# Patient Record
Sex: Female | Born: 1950
Health system: Southern US, Community
[De-identification: ages and names within clinical notes are randomized; demographics above are authoritative.]

## PROBLEM LIST (undated history)

## (undated) DIAGNOSIS — Z87442 Personal history of urinary calculi: Secondary | ICD-10-CM

## (undated) DIAGNOSIS — I480 Paroxysmal atrial fibrillation: Secondary | ICD-10-CM

## (undated) DIAGNOSIS — K5730 Diverticulosis of large intestine without perforation or abscess without bleeding: Secondary | ICD-10-CM

## (undated) DIAGNOSIS — Z950 Presence of cardiac pacemaker: Secondary | ICD-10-CM

## (undated) DIAGNOSIS — M1990 Unspecified osteoarthritis, unspecified site: Secondary | ICD-10-CM

## (undated) DIAGNOSIS — I495 Sick sinus syndrome: Secondary | ICD-10-CM

## (undated) DIAGNOSIS — I499 Cardiac arrhythmia, unspecified: Secondary | ICD-10-CM

## (undated) DIAGNOSIS — F419 Anxiety disorder, unspecified: Secondary | ICD-10-CM

## (undated) HISTORY — PX: CHOLECYSTECTOMY: SHX55

## (undated) HISTORY — DX: Paroxysmal atrial fibrillation: I48.0

## (undated) HISTORY — DX: Anxiety disorder, unspecified: F41.9

## (undated) HISTORY — PX: APPENDECTOMY: SHX54

## (undated) HISTORY — PX: CERVICAL SPINE SURGERY: SHX589

## (undated) HISTORY — DX: Sick sinus syndrome: I49.5

## (undated) HISTORY — PX: INSERT / REPLACE / REMOVE PACEMAKER: SUR710

## (undated) HISTORY — PX: OTHER SURGICAL HISTORY: SHX169

## (undated) HISTORY — DX: Diverticulosis of large intestine without perforation or abscess without bleeding: K57.30

## (undated) HISTORY — PX: ABDOMINAL HYSTERECTOMY: SHX81

## (undated) HISTORY — PX: BREAST BIOPSY: SHX20

## (undated) HISTORY — DX: Unspecified osteoarthritis, unspecified site: M19.90

---

## 2000-12-21 ENCOUNTER — Emergency Department (HOSPITAL_COMMUNITY): Admission: EM | Admit: 2000-12-21 | Discharge: 2000-12-21 | Payer: Self-pay | Admitting: Emergency Medicine

## 2000-12-21 ENCOUNTER — Encounter: Payer: Self-pay | Admitting: Emergency Medicine

## 2001-06-28 ENCOUNTER — Encounter: Payer: Self-pay | Admitting: *Deleted

## 2001-06-28 ENCOUNTER — Emergency Department (HOSPITAL_COMMUNITY): Admission: EM | Admit: 2001-06-28 | Discharge: 2001-06-28 | Payer: Self-pay | Admitting: Emergency Medicine

## 2001-07-30 ENCOUNTER — Inpatient Hospital Stay (HOSPITAL_COMMUNITY): Admission: RE | Admit: 2001-07-30 | Discharge: 2001-07-31 | Payer: Self-pay | Admitting: Neurosurgery

## 2001-07-30 ENCOUNTER — Encounter: Payer: Self-pay | Admitting: Neurosurgery

## 2001-08-13 ENCOUNTER — Encounter: Payer: Self-pay | Admitting: Neurosurgery

## 2001-08-13 ENCOUNTER — Encounter: Admission: RE | Admit: 2001-08-13 | Discharge: 2001-08-13 | Payer: Self-pay | Admitting: Neurosurgery

## 2002-07-15 ENCOUNTER — Emergency Department (HOSPITAL_COMMUNITY): Admission: EM | Admit: 2002-07-15 | Discharge: 2002-07-15 | Payer: Self-pay | Admitting: Emergency Medicine

## 2002-07-15 ENCOUNTER — Encounter: Payer: Self-pay | Admitting: Emergency Medicine

## 2002-08-02 ENCOUNTER — Encounter: Admission: RE | Admit: 2002-08-02 | Discharge: 2002-08-02 | Payer: Self-pay | Admitting: Neurosurgery

## 2002-08-04 ENCOUNTER — Encounter: Admission: RE | Admit: 2002-08-04 | Discharge: 2002-08-04 | Payer: Self-pay | Admitting: Neurosurgery

## 2002-08-04 ENCOUNTER — Encounter: Payer: Self-pay | Admitting: Neurosurgery

## 2002-09-06 ENCOUNTER — Encounter: Payer: Self-pay | Admitting: Neurosurgery

## 2002-09-08 ENCOUNTER — Inpatient Hospital Stay (HOSPITAL_COMMUNITY): Admission: RE | Admit: 2002-09-08 | Discharge: 2002-09-09 | Payer: Self-pay | Admitting: Neurosurgery

## 2002-09-08 ENCOUNTER — Encounter: Payer: Self-pay | Admitting: Neurosurgery

## 2003-02-22 ENCOUNTER — Encounter: Payer: Self-pay | Admitting: Neurosurgery

## 2003-02-22 ENCOUNTER — Encounter: Admission: RE | Admit: 2003-02-22 | Discharge: 2003-02-22 | Payer: Self-pay | Admitting: Neurosurgery

## 2004-08-08 ENCOUNTER — Ambulatory Visit (HOSPITAL_COMMUNITY): Admission: RE | Admit: 2004-08-08 | Discharge: 2004-08-08 | Payer: Self-pay | Admitting: Family Medicine

## 2004-09-04 ENCOUNTER — Other Ambulatory Visit: Admission: RE | Admit: 2004-09-04 | Discharge: 2004-09-04 | Payer: Self-pay | Admitting: Family Medicine

## 2004-09-04 ENCOUNTER — Ambulatory Visit (HOSPITAL_COMMUNITY): Admission: RE | Admit: 2004-09-04 | Discharge: 2004-09-04 | Payer: Self-pay | Admitting: Family Medicine

## 2005-02-14 ENCOUNTER — Ambulatory Visit (HOSPITAL_COMMUNITY): Admission: RE | Admit: 2005-02-14 | Discharge: 2005-02-14 | Payer: Self-pay | Admitting: Family Medicine

## 2005-02-19 ENCOUNTER — Ambulatory Visit (HOSPITAL_COMMUNITY): Admission: RE | Admit: 2005-02-19 | Discharge: 2005-02-19 | Payer: Self-pay | Admitting: Family Medicine

## 2005-07-23 ENCOUNTER — Ambulatory Visit (HOSPITAL_COMMUNITY): Admission: RE | Admit: 2005-07-23 | Discharge: 2005-07-23 | Payer: Self-pay | Admitting: Family Medicine

## 2005-12-12 ENCOUNTER — Ambulatory Visit (HOSPITAL_COMMUNITY): Admission: RE | Admit: 2005-12-12 | Discharge: 2005-12-12 | Payer: Self-pay | Admitting: Family Medicine

## 2005-12-15 HISTORY — PX: COLONOSCOPY: SHX174

## 2006-01-28 ENCOUNTER — Ambulatory Visit (HOSPITAL_COMMUNITY): Admission: RE | Admit: 2006-01-28 | Discharge: 2006-01-28 | Payer: Self-pay | Admitting: Family Medicine

## 2006-10-25 ENCOUNTER — Emergency Department (HOSPITAL_COMMUNITY): Admission: EM | Admit: 2006-10-25 | Discharge: 2006-10-25 | Payer: Self-pay | Admitting: Emergency Medicine

## 2006-10-27 ENCOUNTER — Ambulatory Visit: Payer: Self-pay | Admitting: Internal Medicine

## 2006-10-28 ENCOUNTER — Ambulatory Visit (HOSPITAL_COMMUNITY): Admission: RE | Admit: 2006-10-28 | Discharge: 2006-10-28 | Payer: Self-pay | Admitting: Internal Medicine

## 2006-10-28 ENCOUNTER — Encounter (INDEPENDENT_AMBULATORY_CARE_PROVIDER_SITE_OTHER): Payer: Self-pay | Admitting: Specialist

## 2006-10-28 ENCOUNTER — Ambulatory Visit: Payer: Self-pay | Admitting: Internal Medicine

## 2006-11-10 ENCOUNTER — Ambulatory Visit: Payer: Self-pay | Admitting: Internal Medicine

## 2006-11-18 ENCOUNTER — Ambulatory Visit (HOSPITAL_COMMUNITY): Admission: RE | Admit: 2006-11-18 | Discharge: 2006-11-18 | Payer: Self-pay | Admitting: Internal Medicine

## 2006-11-26 ENCOUNTER — Ambulatory Visit: Payer: Self-pay | Admitting: Internal Medicine

## 2006-12-17 ENCOUNTER — Ambulatory Visit: Payer: Self-pay | Admitting: Internal Medicine

## 2007-10-20 ENCOUNTER — Ambulatory Visit (HOSPITAL_COMMUNITY): Admission: RE | Admit: 2007-10-20 | Discharge: 2007-10-20 | Payer: Self-pay | Admitting: Family Medicine

## 2007-12-16 HISTORY — PX: OTHER SURGICAL HISTORY: SHX169

## 2008-02-18 ENCOUNTER — Ambulatory Visit (HOSPITAL_COMMUNITY): Admission: RE | Admit: 2008-02-18 | Discharge: 2008-02-18 | Payer: Self-pay | Admitting: Family Medicine

## 2008-04-18 ENCOUNTER — Ambulatory Visit: Payer: Self-pay | Admitting: Internal Medicine

## 2008-06-08 ENCOUNTER — Ambulatory Visit: Payer: Self-pay | Admitting: Internal Medicine

## 2008-09-18 DIAGNOSIS — K648 Other hemorrhoids: Secondary | ICD-10-CM | POA: Insufficient documentation

## 2008-09-18 DIAGNOSIS — F411 Generalized anxiety disorder: Secondary | ICD-10-CM | POA: Insufficient documentation

## 2008-09-18 DIAGNOSIS — K589 Irritable bowel syndrome without diarrhea: Secondary | ICD-10-CM | POA: Insufficient documentation

## 2008-09-18 DIAGNOSIS — R197 Diarrhea, unspecified: Secondary | ICD-10-CM | POA: Insufficient documentation

## 2008-09-18 DIAGNOSIS — K625 Hemorrhage of anus and rectum: Secondary | ICD-10-CM | POA: Insufficient documentation

## 2008-09-18 DIAGNOSIS — M810 Age-related osteoporosis without current pathological fracture: Secondary | ICD-10-CM | POA: Insufficient documentation

## 2008-09-18 DIAGNOSIS — R634 Abnormal weight loss: Secondary | ICD-10-CM | POA: Insufficient documentation

## 2008-09-18 DIAGNOSIS — Q279 Congenital malformation of peripheral vascular system, unspecified: Secondary | ICD-10-CM | POA: Insufficient documentation

## 2008-11-27 ENCOUNTER — Ambulatory Visit (HOSPITAL_COMMUNITY): Admission: RE | Admit: 2008-11-27 | Discharge: 2008-11-27 | Payer: Self-pay | Admitting: Family Medicine

## 2008-12-11 ENCOUNTER — Ambulatory Visit (HOSPITAL_COMMUNITY): Admission: RE | Admit: 2008-12-11 | Discharge: 2008-12-11 | Payer: Self-pay | Admitting: Family Medicine

## 2009-08-08 ENCOUNTER — Ambulatory Visit (HOSPITAL_COMMUNITY): Admission: RE | Admit: 2009-08-08 | Discharge: 2009-08-08 | Payer: Self-pay | Admitting: Family Medicine

## 2010-01-15 ENCOUNTER — Emergency Department (HOSPITAL_COMMUNITY): Admission: EM | Admit: 2010-01-15 | Discharge: 2010-01-15 | Payer: Self-pay | Admitting: Emergency Medicine

## 2010-02-15 ENCOUNTER — Ambulatory Visit (HOSPITAL_COMMUNITY): Admission: RE | Admit: 2010-02-15 | Discharge: 2010-02-15 | Payer: Self-pay | Admitting: Cardiovascular Disease

## 2010-03-01 ENCOUNTER — Ambulatory Visit (HOSPITAL_COMMUNITY): Admission: RE | Admit: 2010-03-01 | Discharge: 2010-03-01 | Payer: Self-pay | Admitting: Family Medicine

## 2010-05-02 ENCOUNTER — Ambulatory Visit (HOSPITAL_COMMUNITY): Admission: RE | Admit: 2010-05-02 | Discharge: 2010-05-02 | Payer: Self-pay | Admitting: Otolaryngology

## 2010-10-14 ENCOUNTER — Ambulatory Visit (HOSPITAL_COMMUNITY): Admission: RE | Admit: 2010-10-14 | Discharge: 2010-10-14 | Payer: Self-pay | Admitting: Internal Medicine

## 2010-10-21 ENCOUNTER — Emergency Department (HOSPITAL_COMMUNITY): Admission: EM | Admit: 2010-10-21 | Discharge: 2010-10-21 | Payer: Self-pay | Admitting: Emergency Medicine

## 2011-04-29 NOTE — Assessment & Plan Note (Signed)
NAMESAIDY, [name]                 CHART#:  [id number]   DATE:  06/08/2008                       DOB:  07-17-1951   FOLLOWUP:  Intermittent bloody diarrhea, ileal ulcers, colonoscopy,  however, AVMs only on given small bowel capsule.  Biopsies of the  terminal ileum revealed normal mucosa.  This lady was last seen on May 01, 2008, for given capsule study.  Along the way, she was seen by Dr.  Nobie Thompson who went ahead and treated her with Cipro and Flagyl, and this  was associated with marked improvement in her symptoms.  She says  diarrhea and blood per rectum have resolved, and she is feeling like her  old self.  She is having no abdominal pain.  She is having 1-2 firm  bowel movements daily.  She is quite pleased with her progress.  I  suspect she has had a protracted infection and perhaps an element of  post infectious irritable bowel syndrome.  The good news is that she did  not have any significant pathology on December 28, 2005, colonoscopy.  There is no family history of colorectal neoplasia, and she has not had  a history of polyps previously, so she will be due for a followup  colonoscopy in 2017.   CURRENT MEDICATIONS:  See updated list.   ALLERGIES:  AMPICILLIN.   PHYSICAL EXAMINATION:  GENERAL:  She looks well.  VITAL SIGNS:  Weight 150, height 5 feet 2 inches, temperature 98.1, BP  115/70, and pulse 68.  SKIN:  Warm and dry.  ABDOMEN:  Flat.  Positive bowel sounds.  Soft and entirely nontender  without appreciable mass or organomegaly.   ASSESSMENT:  Protracted episode of diarrhea, sometimes bloody.  I  suspect she did have an infectious process and may have an element of  post infectious irritable bowel syndrome.  Overall, she is doing very  well at this point in time.  I do not see any need for further  gastrointestinal evaluation, as long as she continues to do well.  I  have asked her to avoid nonsteroidals as much as possible.   PLAN:  Plan is to see this lady  back on p.r.n. basis and slate her for  2017 screening colonoscopy.      [name], M.D.  Electronically Signed    RMR/MEDQ  D:  06/08/2008  T:  06/09/2008  Job:  [id number]
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
## 2011-05-02 NOTE — Op Note (Signed)
NAME:  [name], [name]                          ACCOUNT NO.:  [id number]   MEDICAL RECORD NO.:  [id number]                   PATIENT TYPE:  INP   LOCATION:  3038                                 FACILITY:  [facility]   PHYSICIAN:  Payton Doughty, M.D.                   DATE OF BIRTH:  1951-10-20   DATE OF PROCEDURE:  09/08/2002  DATE OF DISCHARGE:                                 OPERATIVE REPORT   PREOPERATIVE DIAGNOSES:  Herniated disk at C6-C7.   POSTOPERATIVE DIAGNOSES:  Herniated disk at C6-C7.   OPERATION PERFORMED:  C6-C7 anterior cervical diskectomy and fusion with a  tether plate.   SURGEON:  Payton Doughty, M.D.   ASSISTANT:  1. Hewitt Shorts, M.D.  2. Brooks.   ANESTHESIA:  General endotracheal.   PREP:  Sterile Betadine prep and scrub with alcohol wipe.   DESCRIPTION OF PROCEDURE:  The patient is a 60 year old right-handed white  female with congenital fusion at C5-6 and disk at C6-7.  The patient was  taken to the operating room, smoothly anesthetized and intubated and placed  supine on the operating table.  Following shave, prep and drape in the usual  sterile fashion the skin was incised approximately two fingerbreadths below  her old incision.  The platysma was identified, elevated, divided and  undermined.  Sternocleidomastoid muscle was identified.  Medial dissection  revealed the carotid artery retracted laterally to the left, trachea and  esophagus retracted laterally to the right.  The bone of the anterior  cervical spine was thus isolated and intraoperative x-ray was at C7-T1.  Diskectomy was carried out at C6-7 and the next level above that.  Following  brush removal of the disk, the operating microscope was then brought in and  we used microdissection technique to dissect the anterior epidural space,  remove the remaining disk, decompress the nerve roots and divide the  posterior longitudinal ligament.  Having completed these maneuvers, the  neural  foramina were carefully explored and found to be open.  The wound was  irrigated and hemostasis assured.  A 7 mm bone graft was fashioned from  patellar allograft and tapped into place.  A 12 mm tether plate was then  used with 12 mm screws, two in C6 and two in C7.  Intraoperative x-ray  showed good placement of bone graft, plate and screws.  The wound was  irrigated, hemostasis assured.  Once small arterial bleeder was bipolared  carefully and observed  for several minutes and found to be completely  hemostatic.  The platysma was reapproximated with 3-0 Vicryl in interrupted  fashion.  Subcutaneous tissues were reapproximated with 3-0 Vicryl in  interrupted fashion.  Skin was closed with 4-0 Vicryl in running  subcuticular fashion.  Benzoin and Steri-Strips were placed and made  occlusive with Telfa and OpSite.  The patient was then transferred to the  recovery  room in good condition.                                                Payton Doughty, M.D.   MWR/MEDQ  D:  09/08/2002  T:  09/09/2002  Job:  [id number]

## 2011-05-02 NOTE — Op Note (Signed)
[name], LANDAU                ACCOUNT NO.:  [id number]   MEDICAL RECORD NO.:  [id number]          PATIENT TYPE:  AMB   LOCATION:  DAY                           FACILITY:  [facility]   PHYSICIAN:  R. Roetta Sessions, M.D. DATE OF BIRTH:  17-Jun-1951   DATE OF PROCEDURE:  11/18/2006  DATE OF DISCHARGE:  11/18/2006                               OPERATIVE REPORT   PROCEDURE:  Small bowel Givens capsule endoscopy.   PHYSICIAN:  R. Roetta Sessions, M.D.   INDICATIONS FOR PROCEDURE:  4-6 week history of diarrhea with abnormal  terminal ileum on colonoscopy with terminal ileoscopy October 28, 2006.  The mucosa at the terminal ileum was slightly heaped up and had two 4-5  mm ulcers present.  Biopsies came back negative.  Random sigmoid biopsy  is negative, as well.  The study is being done to further evaluate her  small bowel and for chronic diarrhea.   PROCEDURE FINDINGS:  The patient swallowed the capsule without any  difficulty.  She left the endoscopy suite and called shortly after  stating that the light on the recorder was not on.  She came back to  this facility and a new recorder was provided.  On the first recorder,  we had 32 minutes data.  The capsule remained in the stomach the entire  32 minutes.  From the second recorder, unfortunately, the first image  was already in the small bowel, therefore, it is not clear how much of  the proximal small bowel was not seen.  On the second recording, we had  6 hours 53 minutes of images.  The capsule never reached the cecum.  The  observed small bowel appeared to be normal with the exception of one  small possible AVM.  The study was, therefore, incomplete with the most  proximal small bowel and distal small bowel not seen.   SUMMARY:  Incomplete small bowel capsule endoscopy with the proximal and  distal small bowel not seen.  The visualized portion of the small bowel  appeared normal except for the possible small AVM noted at 2 hours 34  minutes 52 seconds.  We will have the patient come back to the office to  re-evaluate her diarrhea.  Further recommendations to follow.      Tana Coast, P.AJonathon Bellows, M.D.  Electronically Signed    LL/MEDQ  D:  11/26/2006  T:  11/26/2006  Job:  [id number]   cc:   Donna Bernard, M.D.  Fax: [phone]
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
## 2011-05-02 NOTE — Op Note (Signed)
[name], [name]                ACCOUNT NO.:  [id number]   MEDICAL RECORD NO.:  [id number]          PATIENT TYPE:  AMB   LOCATION:  DAY                           FACILITY:  [facility]   PHYSICIAN:  R. Roetta Sessions, M.D. DATE OF BIRTH:  1951/07/04   DATE OF PROCEDURE:  10/28/2006  DATE OF DISCHARGE:                                 OPERATIVE REPORT   PROCEDURE:  Colonoscopy with ileoscopy, ileal biopsy, sigmoid biopsy, and  stool sampling.   INDICATIONS FOR PROCEDURE:  The patient is a 60 year old lady with  essentially a 2 1/2 week history of diarrhea and low grade fevers. The  symptoms started acutely 2 1/2 weeks ago.  The initial set of stool studies  negative reportedly in Dr. Geanie Logan office.  She has been on Flagyl and  Bentyl.  Colonoscopy is now being done to further evaluate her symptoms.  This approach has been discussed with the patient at length.  The potential  risks, benefits and alternatives have been reviewed and questions answered.  She is agreeable.  Please see documentation in the medical record.   PROCEDURE NOTE:  O2 saturation, blood pressure, pulse rate were monitored  throughout the entire procedure.  Conscious sedation with Versed 6 mg IV and  Demerol 125 mg IV in divided doses.   INSTRUMENT USED:  Olympus video chip system.   FINDINGS:  Digital rectal exam revealed no abnormalities.  The prep was  adequate.  Rectum:  Examination of the rectal mucosa including retroflex view of the  anal verge revealed only a single anal papilla, the rectal mucosa,  otherwise, appeared normal.  Colon:  Colonic mucosa was surveyed from the rectosigmoid junction through  the left, transverse, right colon, to appendiceal orifice, ileocecal valve  and cecum.  These structures were well seen and photographed for the record.  Terminal ileum:  The distal terminal ileum (last 3-4 cm) appeared abnormal.  The mucosa was slightly heaped up there were at least two 4-5 mm ulcers  present.  Please see photos.  There was no stricturing. This area of  abnormality was biopsied for histologic study.   From this level, the scope was slowly and cautiously withdrawn.  All  previously mentioned mucosal surfaces were again seen.  The patient had  sigmoid diverticula.  The colonic mucosa appeared normal.  Biopsies of the  sigmoid mucosa were taken to rule out co-existent microscopic colitis. Also,  a stool sample was taken.  The patient tolerated the procedure well and was  reacted in endoscopy.   IMPRESSION:  1. Single anal papilla, otherwise, normal rectum.  2. Sigmoid diverticula.  The colonic mucosa appeared normal.  Segmental      biopsy taken. Stool sample collected. Inflammatory changes including      ulceration of the terminal ileum mucosa, status post biopsy.   With the acute onset of her symptoms I continue to be impressed this may  well be most likely an infectious process. If not, new onset  Crohn's  ileitis would be fairly high up on the differential.   RECOMMENDATIONS:  Continue Flagyl 250 mg orally  t.i.d., continue Bentyl 10  mg q.i.d. p.r.n., add Cipro 500 mg orally b.i.d. for the next ten days.  We  will follow-up on pending studies and make further recommendations in the  very near future.      Jonathon Bellows, M.D.  Electronically Signed     RMR/MEDQ  D:  10/28/2006  T:  10/28/2006  Job:  [id number]   cc:   Patrica Duel, M.D.  Fax: [phone]

## 2011-05-02 NOTE — Consult Note (Signed)
[name], [name]                ACCOUNT NO.:  [id number]   MEDICAL RECORD NO.:  [id number]          PATIENT TYPE:  AMB   LOCATION:  DAY                           FACILITY:  [facility]   PHYSICIAN:  R. Roetta Sessions, M.D. DATE OF BIRTH:  1951-05-28   DATE OF CONSULTATION:  10/27/2006  DATE OF DISCHARGE:                                   CONSULTATION   REASON FOR CONSULTATION:  Persistent diarrhea.   HISTORY OF PRESENT ILLNESS:  [name] is a 60 year old Caucasian female  who states she has had a 13-day history of diarrhea.  She tells me she  received a flu vaccination on October 15, 2006.  Later that night she  developed severe nausea, vomiting and diarrhea.  She says now that any time  she eats or drinks, she ends up with loose stools.  She is having six to 10  episodes of diarrhea a day.  Her normal baseline is a bowel movement every  day, if not every other day.  She denies any rectal bleeding or melena.  She  tells me she did run a low-grade temperature around November 3rd and 4th.  She also complains of nausea, but has not vomited since the night of  November 1st.  She complains of myalgias.  She also complains of lower  abdominal cramping pain which she rates as 9/10 on the pain scale.  She has  had significant fatigue, malaise and weakness, especially to her lower  extremities.  Dr. Patrica Duel saw her on October 23, 2006.  He did a CBC  which was normal.  He also ordered a CMP which was normal, except for a  calcium of 8.3.  She had a negative C. difficile and further stool studies  are pending.  He started her on Flagyl 250 mg t.i.d.  She is also taking  Bentyl 10 mg q.i.d.   PAST MEDICAL/SURGICAL HISTORY:  1. Anxiety.  2. Diverticulosis.  3. Osteoporosis.  4. A colonoscopy in 1998, by Dr. Jonathon Bellows, which showed left-sided      diverticula and internal hemorrhoids.  5. She is status post hysterectomy.  6. Cholecystectomy.  7. Appendectomy.  8. She has had two  cervical neck surgeries.  9. A right breast biopsy which was benign.  10.A vocal cord polypectomy.   CURRENT MEDICATIONS:  1. Estradiol 2 mg daily.  2. __________/phenobarbital one b.i.d.  3. Hydrocodone 7.5 mg/650 mg q.i.d. p.r.n.  4. Bentyl 10 mg q.i.d.  5. Cymbalta 60 mg daily.  6. Flagyl 250 mg t.i.d.  7. A multivitamin one daily.   ALLERGIES:  IODINE AND AMPICILLIN.   FAMILY HISTORY:  There has been no known family history of inflammatory  bowel disease or carcinoma.  Mother deceased at age 14, with a history of  chronic obstructive pulmonary disease.  Father deceased at age 79, with a  history of a myocardial infarction.  She has four healthy siblings.   SOCIAL HISTORY:  She is divorced.  She lives alone.  She has one healthy  child.  She is employed full time with [employer].  She denies any tobacco or  drug use.  She drinks socially on occasion.   REVIEW OF SYSTEMS:  CONSTITUTIONAL:  Weight has been stable.  See the HPI.  CARDIOVASCULAR/LUNGS:  No chest pain, palpitations, shortness of breath,  dyspnea, cough or hemoptysis.  GI:  See the HPI.  Denies any heartburn or  indigestion, dysphagia or odynophagia.  She has had some anorexia with  recent episode of diarrhea, but denies early satiety.   PHYSICAL EXAMINATION:  VITAL SIGNS:  Weight 152.5 pounds, height 62-1/2  inches, temperature 98.4 degrees, blood pressure 140/78, pulse 60.  GENERAL:  [name] is a 60 year old, pale-appearing Caucasian female, who  is alert, oriented, pleasant and cooperative.  She is quite tearful on exam.  HEENT:  Pupils clear.  Anicteric.  Conjunctivae pink.  Oropharynx pink and  moist.  NECK:  Supple without mass or thyromegaly.  CHEST/HEART:  Rate and rhythm normal.  S1 and S2.  No murmurs, rubs or  gallops.  LUNGS:  Clear to auscultation bilaterally.  ABDOMEN:  Positive bowel sounds x4.  She has hyperactive bowel sounds.  The  abdomen is soft and non-distended.  She does have mild  tenderness in the  bilateral lower quadrants on deep palpation.  There is no rebound tenderness  or guarding.  No hepatosplenomegaly. or masses.  EXTREMITIES:  Without clubbing or edema bilaterally.   IMPRESSION/RECOMMENDATIONS:  [name] is a 60 year old Caucasian female,  with the acute onset of nausea, vomiting and diarrhea two weeks ago.  This  was just after receiving an influenza vaccination which may or may not be  related to her persistent diarrhea.  She also complains of significant  fatigue, weakness, myalgias and nausea.  A Clostridium difficile was  negative.  She had a normal complete blood count and normal complete  metabolic profile except for a calcium of 8.3.  Further stool studies are  pending through Dr. Loraine Leriche Cresenzo's office.  She is going to require a  colonoscopy for further evaluation.  I have discussed this with Dr. Jonathon Bellows.   PLAN:  1. Colonoscopy by Dr. Jena Gauss as soon as possible.  2. Further recommendations pending the colonoscopy.  I have discussed this      procedure with the risks and benefits, including but not limited to      bleeding, infection, perforation and drug reaction.  She agrees to      same, and a signed consent will be obtained.  3. She can continue Bentyl 10 mg q.i.d. for now, as her colonoscopy is      going to be scheduled for tomorrow.  4. Also continue Flagyl at this point.   I would like to thank Dr. Nobie Putnam for allowing [place] to participate in the  care of [name].      Nicholas Lose, N.P.      Jonathon Bellows, M.D.  Electronically Signed    KC/MEDQ  D:  10/27/2006  T:  10/27/2006  Job:  [id number]   cc:   Patrica Duel, M.D.  Fax: [phone]

## 2011-06-03 ENCOUNTER — Encounter: Payer: Self-pay | Admitting: Internal Medicine

## 2011-06-03 ENCOUNTER — Encounter: Payer: Self-pay | Admitting: Gastroenterology

## 2011-06-03 ENCOUNTER — Ambulatory Visit (INDEPENDENT_AMBULATORY_CARE_PROVIDER_SITE_OTHER): Payer: Self-pay | Admitting: Gastroenterology

## 2011-06-03 VITALS — BP 131/71 | HR 59 | Temp 97.2°F | Ht 62.0 in | Wt 139.2 lb

## 2011-06-03 DIAGNOSIS — R634 Abnormal weight loss: Secondary | ICD-10-CM

## 2011-06-03 DIAGNOSIS — R197 Diarrhea, unspecified: Secondary | ICD-10-CM

## 2011-06-03 DIAGNOSIS — R1084 Generalized abdominal pain: Secondary | ICD-10-CM | POA: Insufficient documentation

## 2011-06-03 DIAGNOSIS — K625 Hemorrhage of anus and rectum: Secondary | ICD-10-CM

## 2011-06-03 DIAGNOSIS — R1110 Vomiting, unspecified: Secondary | ICD-10-CM | POA: Insufficient documentation

## 2011-06-03 LAB — CBC WITH DIFFERENTIAL/PLATELET
Basophils Absolute: 0.1 10*3/uL (ref 0.0–0.1)
Basophils Relative: 1 % (ref 0–1)
Eosinophils Absolute: 0.1 10*3/uL (ref 0.0–0.7)
Eosinophils Relative: 1 % (ref 0–5)
HCT: 41.8 % (ref 36.0–46.0)
Hemoglobin: 14.1 g/dL (ref 12.0–15.0)
Lymphocytes Relative: 29 % (ref 12–46)
Lymphs Abs: 1.8 10*3/uL (ref 0.7–4.0)
MCH: 32.7 pg (ref 26.0–34.0)
MCHC: 33.7 g/dL (ref 30.0–36.0)
MCV: 97 fL (ref 78.0–100.0)
Monocytes Absolute: 0.3 10*3/uL (ref 0.1–1.0)
Monocytes Relative: 5 % (ref 3–12)
Neutro Abs: 3.8 10*3/uL (ref 1.7–7.7)
Neutrophils Relative %: 63 % (ref 43–77)
Platelets: 272 10*3/uL (ref 150–400)
RBC: 4.31 MIL/uL (ref 3.87–5.11)
RDW: 12.7 % (ref 11.5–15.5)
WBC: 6 10*3/uL (ref 4.0–10.5)

## 2011-06-03 LAB — COMPREHENSIVE METABOLIC PANEL
ALT: 12 U/L (ref 0–35)
AST: 17 U/L (ref 0–37)
Albumin: 4.5 g/dL (ref 3.5–5.2)
Alkaline Phosphatase: 70 U/L (ref 39–117)
BUN: 8 mg/dL (ref 6–23)
CO2: 27 mEq/L (ref 19–32)
Calcium: 9.7 mg/dL (ref 8.4–10.5)
Chloride: 103 mEq/L (ref 96–112)
Creat: 0.7 mg/dL (ref 0.50–1.10)
Glucose, Bld: 94 mg/dL (ref 70–99)
Potassium: 4.5 mEq/L (ref 3.5–5.3)
Sodium: 141 mEq/L (ref 135–145)
Total Bilirubin: 0.5 mg/dL (ref 0.3–1.2)
Total Protein: 7.4 g/dL (ref 6.0–8.3)

## 2011-06-03 LAB — T4, FREE: Free T4: 1.33 ng/dL (ref 0.80–1.80)

## 2011-06-03 LAB — TSH: TSH: 0.612 u[IU]/mL (ref 0.350–4.500)

## 2011-06-03 MED ORDER — HYOSCYAMINE SULFATE 0.125 MG SL SUBL: 0.1250 mg | SUBLINGUAL_TABLET | Freq: Three times a day (TID) | SUBLINGUAL | Status: AC

## 2011-06-03 NOTE — Assessment & Plan Note (Addendum)
Intermittent chronic diarrhea associated with bloody mucousy discharge, abdominal pain, 9 pound weight loss, vomiting. Prior colonoscopy with ileoscopy showed a couple of ulcers in the terminal ileum, biopsies were unremarkable. Small bowel capsule endoscopy in 09 showed no evidence of persisting ileal ulcers, she had a couple of proximal small bowel AVMs. She states since 2007 she has had intermittent diarrhea. For the last few months she's not had any solid stools. The check for celiac disease, IBD. Plan on colonoscopy plus or minus EGD in the near future.  I have discussed the risks, alternatives, benefits with regards to but not limited to the risk of reaction to medication, bleeding, infection, perforation and the patient is agreeable to proceed. Written consent to be obtained.  We'll go ahead and take patient out of work until workup complete. Procedure planned per tomorrow. FLMA papers to be filled out.

## 2011-06-03 NOTE — Progress Notes (Signed)
Cc to PCP 

## 2011-06-03 NOTE — Progress Notes (Signed)
Primary Care Physician:  Cassell Smiles., MD  Primary Gastroenterologist:  Roetta Sessions, MD  Chief Complaint  Patient presents with  . Diarrhea    bloody  . Abdominal Pain    sick on stomach    HPI:  [name] is a 60 y.o. female here for further evaluation of diarrhea, abdominal pain, blood in the stool, weight loss. She was last seen in 2009 at time of her capsule endoscopy. She has a history of bloody stools in the past, had a colonoscopy in 2007 at that time had a couple of terminal ileal ulcers. Biopsies were negative. Stool studies were negative for infection. She will have a capsule endoscopy for further evaluation of her small bowel and outside of a couple of AVMs there were no AVMs seen. She has continued to have intermittent episodes of diarrhea and blood in the stool. However over the past few months, recurrent symptoms. PP diarrhea or vomiting. Bloody mucousy discharge mostly on tissue. BM 3-4 daily. All stools are watery/loose. Abdomen really sore, "insides hurt". Avoiding foods with seeds. No NSAIDS. ASA 81mg  occasionally (prescribed by cardiologist since she is on hormones). Weight down 8-9 pounds. Poor appetite. No heartburn. No fever. Chronic issues with swallowing. Had a barium esophagram last year that showed some smooth narrowing at the distal esophagus the pill passed without any difficulties. Some mild dysmotility.     Current Outpatient Prescriptions  Medication Sig Dispense Refill  . ALPRAZolam (XANAX) 0.5 MG tablet       . aspirin 81 MG tablet Take 81 mg by mouth 2 (two) times daily.        Marland Kitchen estradiol (ESTRACE) 1 MG tablet       . fish oil-omega-3 fatty acids 1000 MG capsule Take 2 g by mouth daily.        Marland Kitchen HYDROcodone-acetaminophen (LORTAB) 10-500 MG per tablet       . Vitamin D, Ergocalciferol, (DRISDOL) 50000 UNITS CAPS       . zolpidem (AMBIEN) 10 MG tablet       . hyoscyamine (HYOMAX-SL) 0.125 MG SL tablet Place 1 tablet (0.125 mg total) under the  tongue 3 (three) times daily before meals.  90 tablet  1    Allergies as of 06/03/2011 - Review Complete 06/03/2011  Allergen Reaction Noted  . Ampicillin    . Iodine Swelling     Past Medical History  Diagnosis Date  . Osteoarthritis   . Anxiety   . Diverticulosis of colon   . Osteoporosis     Past Surgical History  Procedure Date  . S/p hysterectomy   . Cholecystectomy   . Appendectomy   . Cervical spine surgery     two  . Vocal cord polypectomy   . Breast biopsy     right, benign  . Colonoscopy 2007    sigmoid diverticula, few ulcerations of terminal ileum, biopsies of TI unremarkable, random colon bx neg for microscopic colitis.  . Sb capsule study 2009    Two single small nonbleeding AVMs in the  proximal small bowel, single lymphangiectasia in mid small bowel.    Family History  Problem Relation Age of Onset  . GI problems Sister     had colostomy (infection)  . Colon cancer Neg Hx   . Inflammatory bowel disease Neg Hx     History   Social History  . Marital Status: Divorced    Spouse Name: N/A    Number of Children: N/A  . Years of  Education: N/A   Occupational History  . [employer]    Social History Main Topics  . Smoking status: Never Smoker   . Smokeless tobacco: Not on file  . Alcohol Use: No  . Drug Use: No  . Sexually Active: Not on file   Other Topics Concern  . Not on file   Social History Narrative  . No narrative on file      ROS:  General: Negative for anorexia, fever, chills. C/O fatigue, weakness. Eyes: Negative for vision changes.  ENT: Negative for hoarseness, difficulty swallowing , nasal congestion. CV: Negative for chest pain, angina, palpitations, dyspnea on exertion, peripheral edema.  Respiratory: Negative for dyspnea at rest, dyspnea on exertion, cough, sputum, wheezing.  GI: See history of present illness. GU:  Negative for dysuria, hematuria, urinary incontinence, urinary frequency, nocturnal urination.  MS:  Negative for joint pain, low back pain.  Derm: Negative for rash or itching.  Neuro: Negative for weakness, abnormal sensation, seizure, frequent headaches, memory loss, confusion.  Psych: Negative for anxiety, depression, suicidal ideation, hallucinations.  Endo: Negative for unusual weight change.  Heme: Negative for bruising or bleeding. Allergy: Negative for rash or hives.    Physical Examination:  BP 131/71  Pulse 59  Temp(Src) 97.2 F (36.2 C) (Temporal)  Ht 5\' 2"  (1.575 m)  Wt 139 lb 3.2 oz (63.141 kg)  BMI 25.46 kg/m2   General: Well-nourished, well-developed in no acute distress.  Head: Normocephalic, atraumatic.   Eyes: Conjunctiva pink, no icterus. Mouth: Oropharyngeal mucosa moist and pink , no lesions erythema or exudate. Neck: Supple without thyromegaly, masses, or lymphadenopathy.  Lungs: Clear to auscultation bilaterally.  Heart: Regular rate and rhythm, no murmurs rubs or gallops.  Abdomen: Bowel sounds are normal, diffuse mild abd tenderness, nondistended, no hepatosplenomegaly or masses, no abdominal bruits or    hernia , no rebound or guarding.   Extremities: No lower extremity edema.  Neuro: Alert and oriented x 4 , grossly normal neurologically.  Skin: Warm and dry, no rash or jaundice.   Psych: Alert and cooperative, normal mood and affect.

## 2011-06-03 NOTE — Assessment & Plan Note (Signed)
Intermittent vomiting associated with abdominal pain and diarrhea as outlined above. If colonoscopy is unremarkable with consider EGD at the same time. I have discussed the risks, alternatives, benefits with regards to but not limited to the risk of reaction to medication, bleeding, infection, perforation and the patient is agreeable to proceed. Written consent to be obtained.

## 2011-06-04 ENCOUNTER — Other Ambulatory Visit: Payer: Self-pay | Admitting: Internal Medicine

## 2011-06-04 ENCOUNTER — Ambulatory Visit (HOSPITAL_COMMUNITY)
Admission: RE | Admit: 2011-06-04 | Discharge: 2011-06-04 | Disposition: A | Discharge status: Home / Self Care | Payer: Managed Care, Other (non HMO) | Source: Ambulatory Visit | Attending: Internal Medicine | Admitting: Internal Medicine

## 2011-06-04 ENCOUNTER — Encounter: Payer: Medicare HMO | Admitting: Internal Medicine

## 2011-06-04 DIAGNOSIS — R634 Abnormal weight loss: Secondary | ICD-10-CM | POA: Insufficient documentation

## 2011-06-04 DIAGNOSIS — K633 Ulcer of intestine: Secondary | ICD-10-CM | POA: Insufficient documentation

## 2011-06-04 DIAGNOSIS — K5730 Diverticulosis of large intestine without perforation or abscess without bleeding: Secondary | ICD-10-CM | POA: Insufficient documentation

## 2011-06-04 DIAGNOSIS — R197 Diarrhea, unspecified: Secondary | ICD-10-CM

## 2011-06-04 DIAGNOSIS — R109 Unspecified abdominal pain: Secondary | ICD-10-CM | POA: Insufficient documentation

## 2011-06-04 HISTORY — PX: COLONOSCOPY: SHX174

## 2011-06-04 HISTORY — PX: ESOPHAGOGASTRODUODENOSCOPY: SHX1529

## 2011-06-04 LAB — IGA: IgA: 296 mg/dL (ref 69–380)

## 2011-06-04 LAB — TISSUE TRANSGLUTAMINASE, IGA: Tissue Transglutaminase Ab, IgA: 8.2 U/mL (ref <20)

## 2011-06-17 ENCOUNTER — Telehealth: Payer: Self-pay

## 2011-06-17 NOTE — Telephone Encounter (Signed)
Agree 

## 2011-06-17 NOTE — Telephone Encounter (Signed)
Pt came by office requesting path results. Spoke with LSL about path report- informed pt- no cancer, no celiac +ulcer, will let her know RMR recommendations when he returns. Pt stated she was having a lot of weakness, diarrhea, loss of appitite, abd pain and cramping after eating. Informed LSL, she advised pt should increase hyomax to 2 tid and we will get with her next week when RMR returns. Pt stated she would and would like RMR to let her know something asap because it was hard for her to work like this.

## 2011-06-25 ENCOUNTER — Telehealth: Payer: Self-pay

## 2011-06-25 NOTE — Telephone Encounter (Signed)
Pt called- increasing hyomax to 2 bid has not helped. Pt still having diarrhea 2-3x a day, no appitite and pain, nausea and diarrhea after eating. Pt is staying on a bland diet but it still not able to eat much. She stated she still feels weak. Please advise.

## 2011-06-29 NOTE — Telephone Encounter (Signed)
See letter.

## 2011-06-29 NOTE — Telephone Encounter (Signed)
See letter to pt

## 2011-06-30 NOTE — Op Note (Signed)
NAMEJAYNEE, [name]                ACCOUNT NO.:  [id number]  MEDICAL RECORD NO.:  [id number]  LOCATION:  DAYP                          FACILITY:  [facility]  PHYSICIAN:  R. Roetta Sessions, MD FACP FACGDATE OF BIRTH:  06/21/1951  DATE OF PROCEDURE: DATE OF DISCHARGE:                              OPERATIVE REPORT   PROCEDURE:  EGD with gastric small bowel biopsy followed by ileocolonoscopy with segmental biopsy.  INDICATIONS FOR PROCEDURE:  A pleasant 60 year old lady with chronic intermittent diarrhea, nonspecific abdominal pain, and weight loss recently.  EGD colonoscopy now being done to further evaluate her symptoms.  Risks, benefits, limitations, alternatives, imponderables have been discussed, questions answered.  Please see the documentation in the medical record.  This lady has also had some intermittent nausea and vomiting.  This approach has been discussed with patient at length previously and again today at bedside.  Risks, benefits, limitations, alternatives, imponderables have been reviewed, questions have been answered.  PROCEDURE NOTE:  O2 saturation, blood pressure, pulse, respirations were monitored throughout the entirety of procedure.  CONSCIOUS SEDATION:  Versed 6 mg IV, Demerol 100 mg IV in divided doses, Phenergan 12.5 mg IV diluted slow IV push to augment conscious sedation. Cetacaine spray for topic pharyngeal anesthesia.  FINDINGS:  EGD examination of tubular esophagus revealed no mucosal abnormalities.  EG junction was easily traversed.  Stomach:  Gastric cavity was emptied and insufflated well with air. Thorough examination of gastric mucosa including retroflexion of proximal stomach, esophagogastric junction demonstrated diffuse submucosal gastric petechiae.  No ulcer infiltrating process was seen. Pylorus was patent, easily traversed.  Examination of the bulb second, and third portion revealed a smooth-appearing duodenal mucosa, folds were preserved  however.  THERAPEUTIC/DIAGNOSTIC MANEUVERS PERFORMED:  Biopsies of the second and third portion of the duodenum were taken for histologic study. Subsequently, biopsies of the antrum and body of the stomach was taken for histologic study.  The patient tolerated the procedure well and was prepared for colonoscopy.  Digital rectal exam revealed no abnormalities.  Endoscopic findings:  Prep was marginal, but doable. Colon:  Colonic mucosa was surveyed from the rectosigmoid junction to the left transverse right colon to the appendiceal orifice, ileocecal valve/cecum.  These structures were well seen and photographed for the record.  Terminal ileum was intubated to 15 cm.  From this level, scope was slowly and cautiously withdrawn.  All previously mentioned mucosal surfaces were again seen.  The patient had scattered pancolonic diverticula.  There was a 5-mm ulcer at the mouth of the ileocecal valve which was biopsied.  However, further up in the terminal ileum to 15 cm, this segment of the ileum appeared entirely normal.  As the scope was withdrawn, all previously mentioned colonic mucosal surfaces were seen. Aside from pancolonic diverticular mucosal surfaces, the colon appeared normal.  Segmental biopsies of the ascending segment and descending were taken for histologic study.  Scope was pulled down the rectum where a thorough examination of rectal mucosa was undertaken.  The rectal vault was small, attempted to retroflex, but was unable to do so, but for the same reason, I was able to see the rectal mucosa very well en face, there was  some areas of pigmentation in the rectal mucosa.  There is no erosion or ulcer crater.  Biopsies were taken.  The patient tolerated this procedure very well with a cecal withdrawal time of 14 minutes.  IMPRESSION: 1. EGD normal esophagus. 2. Diffuse petechial and gastric submucosal petechiae of uncertain     significance status post biopsy. 3. Smooth  appearing duodenal mucosa with folds preserved, status post     biopsy D2, D3.  COLONOSCOPY FINDINGS: 1. Pigmentation of the rectum with status post biopsy. 2. Pancolonic diverticula and remainder of colonic mucosa appeared     normal status post biopsy in segmental fashion.  Solitary ulcer in     the mouth and ileocecal valve of uncertain significance status post     biopsy.  Upstream distal 15 cm of the terminal ileum appeared     otherwise normal.  This lady may well have underlying inflammatory bowel disease (Crohn ileitis), however findings continue to be nonspecific.  We will followup on histology to make further recommendations in the very near future.     Jonathon Bellows, MD FACP [facility]     RMR/MEDQ  D:  06/04/2011  T:  06/05/2011  Job:  [id number]  cc:   Madelin Rear. Sherwood Gambler, MD Fax: [phone]  Electronically Signed by Lorrin Goodell M.D. on 06/30/2011 09:19:46 AM

## 2011-06-30 NOTE — Telephone Encounter (Signed)
Letter was mailed to pt in regards to the biopsy. Are there any recommendations for the diarrhea, lack of appetite, nausea, etc?? Please advise!

## 2011-07-01 NOTE — Telephone Encounter (Signed)
Letter mailed

## 2011-07-04 NOTE — Telephone Encounter (Signed)
Hx of ileal ulcers; could be early IBD(crohns0 but NSAIDS remain in differential; she still w significant sx; Lets try a 1 month course of entocort 6 mg (2 tabs) daily - lets give her samples - then ov w extender- can then decide whaqt to do next depending on clinical response to this approach.

## 2011-07-09 NOTE — Telephone Encounter (Signed)
Tried to call pt- LMOM 

## 2011-07-15 NOTE — Telephone Encounter (Signed)
Tried to call pt- LMOM 

## 2011-07-15 NOTE — Telephone Encounter (Signed)
Mailed letter to pt

## 2011-07-16 ENCOUNTER — Emergency Department (HOSPITAL_COMMUNITY): Payer: Managed Care, Other (non HMO)

## 2011-07-16 ENCOUNTER — Inpatient Hospital Stay (HOSPITAL_COMMUNITY)
Admission: EM | Admit: 2011-07-16 | Discharge: 2011-07-19 | DRG: 244 | Disposition: A | Discharge status: Home / Self Care | Payer: Managed Care, Other (non HMO) | Attending: Cardiology | Admitting: Cardiology

## 2011-07-16 DIAGNOSIS — M47812 Spondylosis without myelopathy or radiculopathy, cervical region: Secondary | ICD-10-CM | POA: Diagnosis present

## 2011-07-16 DIAGNOSIS — R55 Syncope and collapse: Secondary | ICD-10-CM | POA: Diagnosis present

## 2011-07-16 DIAGNOSIS — I495 Sick sinus syndrome: Principal | ICD-10-CM | POA: Diagnosis present

## 2011-07-16 DIAGNOSIS — I441 Atrioventricular block, second degree: Secondary | ICD-10-CM | POA: Diagnosis present

## 2011-07-16 DIAGNOSIS — I4891 Unspecified atrial fibrillation: Secondary | ICD-10-CM | POA: Diagnosis present

## 2011-07-16 HISTORY — PX: PERMANENT PACEMAKER INSERTION: SHX6023

## 2011-07-16 LAB — LIPASE, BLOOD: Lipase: 25 U/L (ref 11–59)

## 2011-07-16 LAB — COMPREHENSIVE METABOLIC PANEL
ALT: 12 U/L (ref 0–35)
AST: 13 U/L (ref 0–37)
Albumin: 3.4 g/dL — ABNORMAL LOW (ref 3.5–5.2)
Alkaline Phosphatase: 62 U/L (ref 39–117)
BUN: 9 mg/dL (ref 6–23)
CO2: 27 mEq/L (ref 19–32)
Calcium: 8.8 mg/dL (ref 8.4–10.5)
Chloride: 108 mEq/L (ref 96–112)
Creatinine, Ser: 0.57 mg/dL (ref 0.50–1.10)
GFR calc Af Amer: >60 mL/min (ref >60)
GFR calc non Af Amer: >60 mL/min (ref >60)
Glucose, Bld: 87 mg/dL (ref 70–99)
Potassium: 3.4 mEq/L — ABNORMAL LOW (ref 3.5–5.1)
Sodium: 142 mEq/L (ref 135–145)
Total Bilirubin: 0.2 mg/dL — ABNORMAL LOW (ref 0.3–1.2)
Total Protein: 6.1 g/dL (ref 6.0–8.3)

## 2011-07-16 LAB — PROTIME-INR
INR: 0.99 (ref 0.00–1.49)
Prothrombin Time: 13.3 seconds (ref 11.6–15.2)

## 2011-07-16 LAB — CBC
HCT: 34.7 % — ABNORMAL LOW (ref 36.0–46.0)
Hemoglobin: 12.3 g/dL (ref 12.0–15.0)
MCH: 32.7 pg (ref 26.0–34.0)
MCHC: 35.4 g/dL (ref 30.0–36.0)
MCV: 92.3 fL (ref 78.0–100.0)
Platelets: 193 10*3/uL (ref 150–400)
RBC: 3.76 MIL/uL — ABNORMAL LOW (ref 3.87–5.11)
RDW: 12.1 % (ref 11.5–15.5)
WBC: 6.5 10*3/uL (ref 4.0–10.5)

## 2011-07-16 LAB — APTT: aPTT: 31 seconds (ref 24–37)

## 2011-07-16 LAB — URINALYSIS, ROUTINE W REFLEX MICROSCOPIC
Bilirubin Urine: NEGATIVE
Glucose, UA: NEGATIVE mg/dL
Hgb urine dipstick: NEGATIVE
Ketones, ur: NEGATIVE mg/dL
Leukocytes, UA: NEGATIVE
Nitrite: NEGATIVE
Protein, ur: NEGATIVE mg/dL
Specific Gravity, Urine: 1.01 (ref 1.005–1.030)
Urobilinogen, UA: 0.2 mg/dL (ref 0.0–1.0)
pH: 7.5 (ref 5.0–8.0)

## 2011-07-16 LAB — DIFFERENTIAL
Basophils Absolute: 0 10*3/uL (ref 0.0–0.1)
Basophils Relative: 1 % (ref 0–1)
Eosinophils Absolute: 0 10*3/uL (ref 0.0–0.7)
Eosinophils Relative: 0 % (ref 0–5)
Lymphocytes Relative: 27 % (ref 12–46)
Lymphs Abs: 1.7 10*3/uL (ref 0.7–4.0)
Monocytes Absolute: 0.3 10*3/uL (ref 0.1–1.0)
Monocytes Relative: 5 % (ref 3–12)
Neutro Abs: 4.4 10*3/uL (ref 1.7–7.7)
Neutrophils Relative %: 68 % (ref 43–77)

## 2011-07-16 LAB — TROPONIN I
Troponin I: <0.3 ng/mL (ref <0.30)
Troponin I: <0.3 ng/mL (ref <0.30)

## 2011-07-16 LAB — CK TOTAL AND CKMB (NOT AT ARMC)
CK, MB: 2.1 ng/mL (ref 0.3–4.0)
CK, MB: 2 ng/mL (ref 0.3–4.0)
Relative Index: INVALID (ref 0.0–2.5)
Relative Index: INVALID (ref 0.0–2.5)
Total CK: 56 U/L (ref 7–177)
Total CK: 55 U/L (ref 7–177)

## 2011-07-16 LAB — MAGNESIUM: Magnesium: 1.9 mg/dL (ref 1.5–2.5)

## 2011-07-16 LAB — TSH: TSH: 1.209 u[IU]/mL (ref 0.350–4.500)

## 2011-07-16 LAB — HEPARIN LEVEL (UNFRACTIONATED): Heparin Unfractionated: 0.25 IU/mL — ABNORMAL LOW (ref 0.30–0.70)

## 2011-07-16 NOTE — Telephone Encounter (Signed)
Pt is aware of OV for 8/2 @ 3 with AS

## 2011-07-17 ENCOUNTER — Ambulatory Visit: Payer: Managed Care, Other (non HMO) | Admitting: Gastroenterology

## 2011-07-17 ENCOUNTER — Telehealth: Payer: Self-pay | Admitting: Gastroenterology

## 2011-07-17 LAB — LIPID PANEL
Cholesterol: 156 mg/dL (ref 0–200)
HDL: 52 mg/dL (ref >39)
LDL Cholesterol: 83 mg/dL (ref 0–99)
Total CHOL/HDL Ratio: 3 RATIO
Triglycerides: 107 mg/dL (ref <150)
VLDL: 21 mg/dL (ref 0–40)

## 2011-07-17 LAB — CK TOTAL AND CKMB (NOT AT ARMC)
CK, MB: 1.8 ng/mL (ref 0.3–4.0)
CK, MB: 1.8 ng/mL (ref 0.3–4.0)
Relative Index: INVALID (ref 0.0–2.5)
Relative Index: INVALID (ref 0.0–2.5)
Total CK: 48 U/L (ref 7–177)
Total CK: 55 U/L (ref 7–177)

## 2011-07-17 LAB — HEMOGLOBIN A1C
Hgb A1c MFr Bld: 5.4 % (ref <5.7)
Mean Plasma Glucose: 108 mg/dL (ref <117)

## 2011-07-17 LAB — HEPARIN LEVEL (UNFRACTIONATED): Heparin Unfractionated: 0.2 IU/mL — ABNORMAL LOW (ref 0.30–0.70)

## 2011-07-17 LAB — BASIC METABOLIC PANEL
BUN: 7 mg/dL (ref 6–23)
CO2: 27 mEq/L (ref 19–32)
Calcium: 8.8 mg/dL (ref 8.4–10.5)
Chloride: 109 mEq/L (ref 96–112)
Creatinine, Ser: 0.6 mg/dL (ref 0.50–1.10)
GFR calc Af Amer: >60 mL/min (ref >60)
GFR calc non Af Amer: >60 mL/min (ref >60)
Glucose, Bld: 97 mg/dL (ref 70–99)
Potassium: 4 mEq/L (ref 3.5–5.1)
Sodium: 142 mEq/L (ref 135–145)

## 2011-07-17 LAB — CBC
HCT: 35.1 % — ABNORMAL LOW (ref 36.0–46.0)
Hemoglobin: 12 g/dL (ref 12.0–15.0)
MCH: 32 pg (ref 26.0–34.0)
MCHC: 34.2 g/dL (ref 30.0–36.0)
MCV: 93.6 fL (ref 78.0–100.0)
Platelets: 192 10*3/uL (ref 150–400)
RBC: 3.75 MIL/uL — ABNORMAL LOW (ref 3.87–5.11)
RDW: 12.4 % (ref 11.5–15.5)
WBC: 5.7 10*3/uL (ref 4.0–10.5)

## 2011-07-17 LAB — TROPONIN I
Troponin I: <0.3 ng/mL (ref <0.30)
Troponin I: <0.3 ng/mL (ref <0.30)

## 2011-07-18 ENCOUNTER — Inpatient Hospital Stay (HOSPITAL_COMMUNITY): Payer: Managed Care, Other (non HMO)

## 2011-07-18 LAB — CBC
HCT: 33.8 % — ABNORMAL LOW (ref 36.0–46.0)
Hemoglobin: 11.5 g/dL — ABNORMAL LOW (ref 12.0–15.0)
MCH: 32 pg (ref 26.0–34.0)
MCHC: 34 g/dL (ref 30.0–36.0)
MCV: 94.2 fL (ref 78.0–100.0)
Platelets: 174 10*3/uL (ref 150–400)
RBC: 3.59 MIL/uL — ABNORMAL LOW (ref 3.87–5.11)
RDW: 12.4 % (ref 11.5–15.5)
WBC: 4.8 10*3/uL (ref 4.0–10.5)

## 2011-07-18 LAB — HEPARIN LEVEL (UNFRACTIONATED): Heparin Unfractionated: <0.1 IU/mL — ABNORMAL LOW (ref 0.30–0.70)

## 2011-07-18 NOTE — Telephone Encounter (Signed)
Routed to provider

## 2011-07-19 LAB — CBC
HCT: 37.8 % (ref 36.0–46.0)
Hemoglobin: 13.1 g/dL (ref 12.0–15.0)
MCH: 32.4 pg (ref 26.0–34.0)
MCHC: 34.7 g/dL (ref 30.0–36.0)
MCV: 93.6 fL (ref 78.0–100.0)
Platelets: 190 10*3/uL (ref 150–400)
RBC: 4.04 MIL/uL (ref 3.87–5.11)
RDW: 12 % (ref 11.5–15.5)
WBC: 6.3 10*3/uL (ref 4.0–10.5)

## 2011-07-21 NOTE — Op Note (Signed)
NAMESHARLIE, [name]                ACCOUNT NO.:  [id number]  MEDICAL RECORD NO.:  [id number]  LOCATION:  4713                         FACILITY:  [facility]  PHYSICIAN:  Thurmon Fair, MD     DATE OF BIRTH:  [birth year]  DATE OF PROCEDURE:  07/17/2011 DATE OF DISCHARGE:                              OPERATIVE REPORT   PROCEDURES PERFORMED: 1. Implantation of new dual-chamber permanent pacemaker. 2. Fluoroscopy. 3. Light-to-moderate sedation.  REASON FOR THE PROCEDURE: 1. Symptomatic bradycardia due to sinus node dysfunction. 2. Tachycardia-bradycardia syndrome. 3. Recurrent supraventricular arrhythmias (paroxysmal atrial     fibrillation versus atrial tachycardia). 4. Second-degree heart block at low heart rate. 5. Recurrent presyncope.  Procedure performed by Thurmon Fair, MD.  MEDICATIONS ADMINISTERED:  Vancomycin 1 g intravenously, Versed and fentanyl for moderate sedation, lidocaine 1% 30 mL locally.  DEVICE DETAILS:  Generator, [employer] Accent RF dual-chamber pacemaker, model number [id number], serial number [id number].  Atrial lead, St. Jude Medical 2088 TC - 46 cm, serial number [id number].  Ventricular lead, St. Jude Medical 2088 TC - 52 cm, serial number [id number].  ESTIMATED BLOOD LOSS:  Less than 10 mL.  COMPLICATIONS:  None.  After risks and benefits of the procedure was described, the patient provided informed consent, was prepped and draped in the usual sterile fashion.  Local anesthesia was administered to the left infraclavicular area and a 6-cm horizontal incision was made parallel to the inferior border of the left clavicle roughly 3 cm caudal to it.  Using electrocautery and blunt dissection, a prepectoral pocket was created down to the level of the muscular fascia.  The pocket was carefully inspected for hemostasis.  An antibiotic-soaked sponge was placed in the pocket.  Under fluoroscopic guidance and using the modified Seldinger technique and  two separate venipunctures, two separate J-tipped guidewires were placed in the left subclavian vein.  These were subsequently exchanged for two 7-French safe sheaths.  Under fluoroscopic guidance, ventricular lead was advanced to level of the mid to apical right ventricular septum and the active fixation helix was deployed.  There was prominent current of injury.  There was no evidence of diaphragmatic/phrenic nerve stimulation at maximum device output.  Satisfactory sensing and pacing parameters were seen.  The safe sheath was peeled away and the lead was secured in place using 2-0 silk.  In a similar fashion, atrial lead was advanced to the level of the right atrial appendage and the active fixation helix was deployed.  There was prominent current of injury.  There was no evidence of diaphragmatic/phrenic nerve stimulation at maximum device output.  There was satisfactory sensing and pacing parameters.  The safe sheath was peeled away and the lead was secured in place using 2-0 silk.  Subsequently, the generator was attached to the ventricular and subsequently atrial lead with expected ventricular pacing and AV sequential pacing noted.  The generator was placed in the pocket with great care being taken that leads be located deep to the generator.  The pocket was then closed in layers using two layers of 2-0 Vicryl and cutaneous staples after which a sterile dressing was applied.  No immediate complications occurred.  During the procedure, the following electrical parameters were encountered.  Right atrial lead, threshold 0.7 volts at 0.5 milliseconds pulse width. Sensed P-waves 3.1 mV, impedance 635 ohms, current 1.2 mA.  Right ventricular lead, threshold 0.5 volts at 0.5 milliseconds pulse width.  R wave 16.8 mV, impedance 801 ohms, current 0.7 mA.     Thurmon Fair, MD     MC/MEDQ  D:  07/17/2011  T:  07/18/2011  Job:  [id number]  cc:   [facility]  Electronically Signed by Thurmon Fair M.D. on 07/21/2011 04:44:30 PM

## 2011-07-26 NOTE — Discharge Summary (Signed)
[name], [name]                ACCOUNT NO.:  [id number]  MEDICAL RECORD NO.:  [id number]  LOCATION:  4713                         FACILITY:  [facility]  PHYSICIAN:  Thurmon Fair, MD     DATE OF BIRTH:  [birth year]  DATE OF ADMISSION:  07/16/2011 DATE OF DISCHARGE:  07/18/2011                              DISCHARGE SUMMARY   DISCHARGE DIAGNOSIS: 1. Paroxysmal atrial fibrillation and sick sinus syndrome, status post     St. Jude pacemaker implant this admission. 2. History of irritable bowel syndrome, seen by Dr. Elyn Peers in     [place]. 3. Degenerative joint disease with cervical spondylosis.  HOSPITAL COURSE:  The patient is a 60 year old female from [place]. She has seen [place] in the past for a history of paroxysmal atrial fibrillation.  She has baseline bradycardia.  She has been intolerant to BETA-BLOCKERS and AMIODARONE in the past.  We last saw her in the office in the fall of this year.  She presented on July 16, 2011, after an episode of tachycardia which lasted about 5 minutes.  The onset was at work.  She became profoundly weak and was actually being helped to the floor.  She became diaphoretic.  Apparently, it broke spontaneously.  By the time EMS got there, she was in sinus bradycardia.  In the emergency room, she had heart rates in the 40s.  She felt fatigued.  She was admitted for further evaluation.  She was on no chronotropic agents. Her CK-MB and troponins were negative for an MI.  She does have a history of prior echocardiogram and Myoview in 2009 that were unremarkable.  On the morning of 2nd, the patient continued to feel fatigued.  Her heart rate was 48.  She was seen by Dr. Jeral Fruit who discussed options with the patient and family and it was decided to proceed with elective pacemaker implant.  This was done later on the second.  The patient had been on heparin on admission and this was discontinued prior to her pacemaker.  She tolerated this well.  She  is ambulated on the third and we feel she can be discharged.  We did add low-dose beta-blocker prior to discharge.  Chest x-ray prior to discharge shows uncomplicated placement of the left pacer.  OTHER LABORATORY DATA:  White count 4.8, hemoglobin 11.5, hematocrit 33.8, and platelets 174.  CK-MB and troponins were negative x4.  Amylase and lipase were normal.  LFTs were normal.  Chemistry shows sodium 142, potassium 4.0, BUN 7, and creatinine 0.6.  Hemoglobin A1c is 5.4.  TSH 1.20.  Cholesterol 156, HDL 62, and LDL 83.  DISPOSITION:  The patient is discharged in stable condition.  Currently, her rhythm is paced.  She will follow up with Dr. Royann Shivers in [place] for site check.  I should note the patient does have an IODINE allergy, which caused hives and trouble breathing in the past.  She did not have any reactions this admission.     Abelino Derrick, P.A.   ______________________________ Thurmon Fair, MD    LKK/MEDQ  D:  07/18/2011  T:  07/18/2011  Job:  [id number]  Electronically Signed by Corine Shelter P.A. on 07/22/2011  11:56:13 AM Electronically Signed by Thurmon Fair M.D. on 07/26/2011 11:27:10 AM

## 2011-07-31 NOTE — Telephone Encounter (Signed)
Needs f/u appt with [place] to see how she is doing.

## 2011-08-11 ENCOUNTER — Encounter: Payer: Self-pay | Admitting: Internal Medicine

## 2011-08-11 NOTE — Telephone Encounter (Signed)
Letter mailed to pt to call office and set up OV

## 2011-08-17 NOTE — H&P (Signed)
[name], [name]                ACCOUNT NO.:  [id number]  MEDICAL RECORD NO.:  [id number]  LOCATION:  4713                         FACILITY:  [facility]  PHYSICIAN:  Thereasa Solo. Bence Trapp, M.D. DATE OF BIRTH:  12-Mar-1951  DATE OF ADMISSION:  07/16/2011 DATE OF DISCHARGE:                             HISTORY & PHYSICAL   CHIEF COMPLAINT:  Palpitations, increasing heart rate associated with chest pain.  HISTORY OF PRESENT ILLNESS:  A 60 year old white female presented to the emergency room today, July 16, 2011, with complaints of palpitations. She was at work this morning and developed palpitations described as racing heart associated with nausea, diaphoresis, and shortness of breath as well as chest pain.  She stated this was the worst episode she has ever had.  EMS was called and en route to the emergency room, she was found to be in sinus rhythm with sinus brady.  Prior to today's episode, she had an episode yesterday, she was working in the yard, developed rapid heart rate, shortness of breath and then the rapid heart rate stopped.  She felt weak and had rested for while before she could even leave the yard.  She had no rapid heart rates during the night, but did not rest well either.  Currently in the emergency room, she has sinus bradycardia 48-55, continues with mild chest discomfort.  She has a history of occasional paroxysmal AFib with intolerance to amiodarone due to shortness of breath.  Previous AFib, brief episodes only, lasting only seconds.  She has been on amiodarone and did not tolerate it as stated.  She also did not tolerate beta-blockers.  Recently, her aspirin was discontinued secondary to inflamed colon.  She had an EGD and colonoscopy by Dr. Jena Gauss in [place] secondary to weight loss and diarrhea.  He felt she may have Crohn ileitis, but that was not a definite diagnosis.  She has a CHADS score 0, so she has only been on aspirin in the past for  anticoagulation.  PAST MEDICAL HISTORY: 1. Paroxysmal AFib, episodes very brief with intolerance to amiodarone     and beta-blockers.  A 2-D echo in 2009, EF greater than 55% with     mild asymmetric LVH, trace MR, trace TR, and normal LA size.     Persantine Myoview done in May 2009; negative ischemia for 75%     neck. 2. Inflamed bowel, currently on Levbid to assist with her symptoms. 3. She has had history of cervical spondylosis and surgery with a C7     diskectomy and fusion. 4. She has also had status post cholecystectomy, hysterectomy,     appendectomy, vocal cord polypectomy.  FAMILY HISTORY:  Mother died of 53 with emphysema.  Father died at 77 with coronary artery disease.  SOCIAL HISTORY:  Divorced, one child, one grandchild.  She walks for exercise 25 minutes daily.  She stopped tobacco 15 years ago.  No alcohol use and she continues to work.  ALLERGIES:  AMPICILLIN and IODINE.  OUTPATIENT MEDICATIONS: 1. TobraDex one drop both eyes twice a day. 2. Hycosamine sublingual 0.125 mg one t.i.d. before meals. 3. Hydrocodone/APAP 10/500 one every 6 hours p.r.n. 4. Vitamin D2 50,000  units every Friday. 5. Lovaza 1 g every morning. 6. Xanax 0.25 mg daily p.r.n. 7. Ambien 10 mg one p.o. nightly. 8. Estrace 1 mg one p.o. every other day and actually she only takes     it on occasion.  REVIEW OF SYSTEMS:  GENERAL:  No colds or fevers.  SKIN:  No rashes. HEENT:  No blurred vision or double vision.  GI:  Frequent diarrhea.  No melena, being worked up with Dr. Jena Gauss.  GU:  No hematuria or dysuria. NEURO:  Some lightheadedness at work today.  MUSCULOSKELETAL:  Negative, though she does have back pain with her history of back problems. ENDOCRINE:  No diabetes or thyroid disease.  CARDIOVASCULAR:  See HPI. PULMONARY:  See HPI.  PHYSICAL EXAMINATION:  VITAL SIGNS:  Blood pressure 137/75, pulse 59, respiratory rate 11, temperature 98.2, oxygen saturation was 98%. GENERAL:  Alert  and oriented female with a flat affect.  Not short of breath currently, does sate she has mild chest discomfort. LUNGS:  Clear without wheezes. HEART:  Regular rate and rhythm. ABDOMEN:  Soft, nontender except for mild epigastric tenderness, otherwise no severe tenderness. LOWER EXTREMITIES:  No edema. NECK:  Supple without bruits.  On monitor, heart rate is low as 52, sinus rhythm.  IMPRESSION: 1. Palpitations with rapid heart rate bradycardia-tachycardia     syndrome, now bradycardic in the 40s. 2. Chest pain, rule out myocardial infarction. 3. Recent diagnosis of inflamed colon.  PLAN:  IV heparin.  Serial CK-MBs to rule out MI.  No beta-blockers or rate-slowing medications secondary to bradycardia currently.  Monitor for what was presumed to be atrial fibrillation with a history of paroxysmal AFib.  We will place the patient on heparin for now and monitor overnight with further plan once labs are completed.  Dr. Clarene Duke saw her and assessed her with me.     Darcella Gasman. [place], N.P.   ______________________________ Thereasa Solo Travus Oren, M.D.    LRI/MEDQ  D:  07/16/2011  T:  07/17/2011  Job:  [id number]  cc:   Thurmon Fair, MD Patrica Duel, M.D. Jonathon Bellows, MD [facility]  Electronically Signed by Nada Boozer N.P. on 07/17/2011 10:24:58 AM Electronically Signed by Julieanne Manson M.D. on 08/17/2011 02:26:31 PM

## 2012-07-13 ENCOUNTER — Other Ambulatory Visit (HOSPITAL_COMMUNITY): Payer: Self-pay

## 2012-07-13 DIAGNOSIS — R197 Diarrhea, unspecified: Secondary | ICD-10-CM

## 2012-07-13 DIAGNOSIS — R109 Unspecified abdominal pain: Secondary | ICD-10-CM

## 2012-07-16 ENCOUNTER — Ambulatory Visit (HOSPITAL_COMMUNITY)
Admission: RE | Admit: 2012-07-16 | Discharge: 2012-07-16 | Disposition: A | Discharge status: Home / Self Care | Payer: Managed Care, Other (non HMO) | Source: Ambulatory Visit

## 2012-07-16 DIAGNOSIS — R197 Diarrhea, unspecified: Secondary | ICD-10-CM | POA: Insufficient documentation

## 2012-07-16 DIAGNOSIS — Z9089 Acquired absence of other organs: Secondary | ICD-10-CM | POA: Insufficient documentation

## 2012-07-16 DIAGNOSIS — R109 Unspecified abdominal pain: Secondary | ICD-10-CM

## 2012-08-07 ENCOUNTER — Emergency Department: Payer: Self-pay | Admitting: Emergency Medicine

## 2012-08-29 IMAGING — CT CT HEAD W/O CM
1 series · 16 of 30 positions shown, 20 images · non-contrast
Comparison: Head CT - 10/21/2010

CLINICAL DATA: Chest pain, shortness of breath, nausea, headache,
weakness and dizziness for 2 days that is getting worse.

CT HEAD WITHOUT CONTRAST
TECHNIQUE: Contiguous axial images were obtained from the base of
the skull through the vertex without contrast.

[Series 2: head routine 4.8 h37s · axial · 0.43mm/px · z∈[+1282,+1415]mm · 16 of 30 slices shown, 20 images]
[im 2/30  brain]
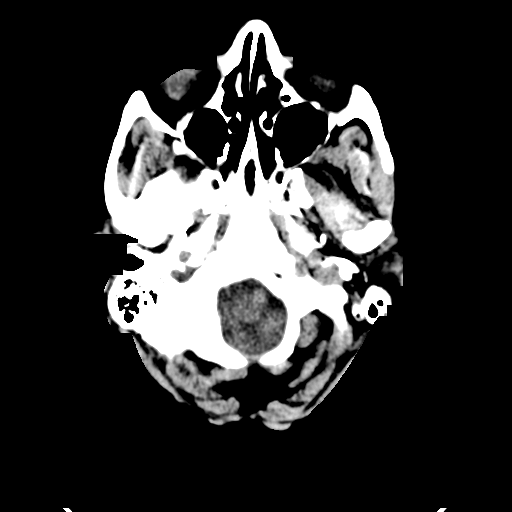
[im 2/30  bone]
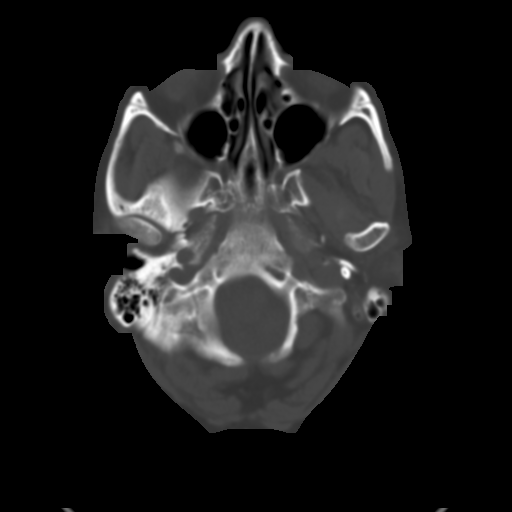
[im 4/30  brain]
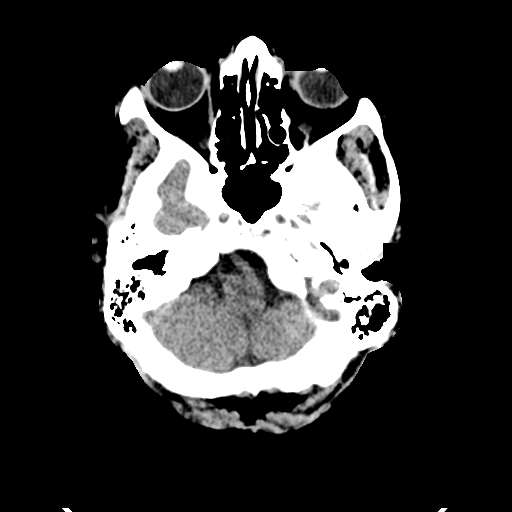
[im 6/30  brain]
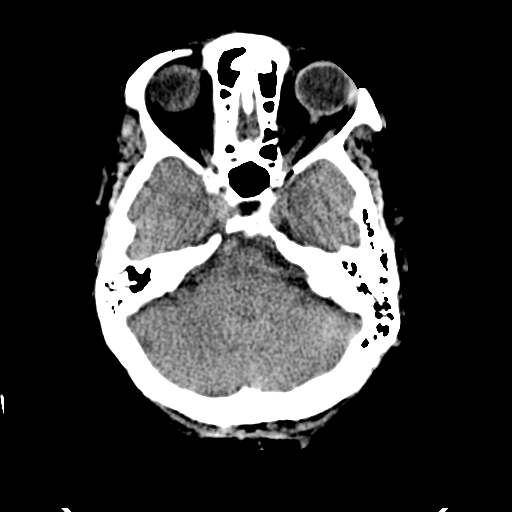
[im 8/30  brain]
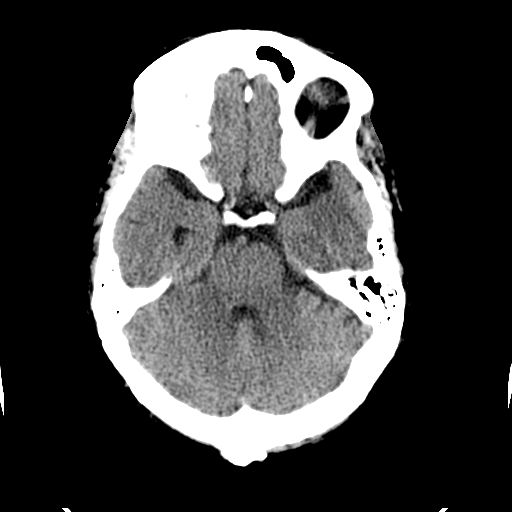
[im 9/30  brain]
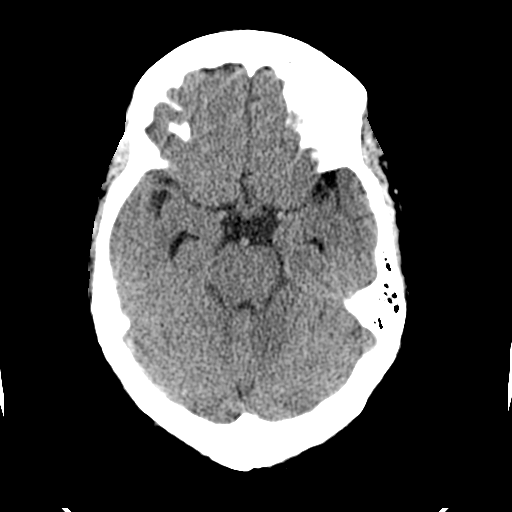
[im 9/30  bone]
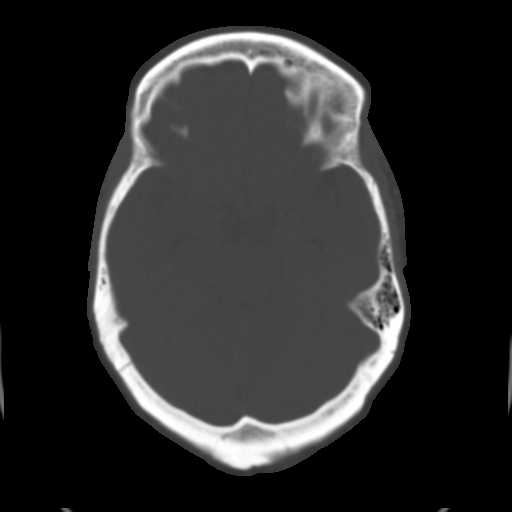
[im 11/30  brain]
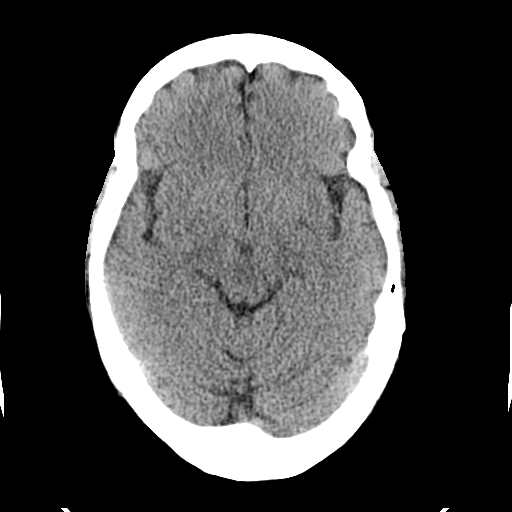
[im 13/30  brain]
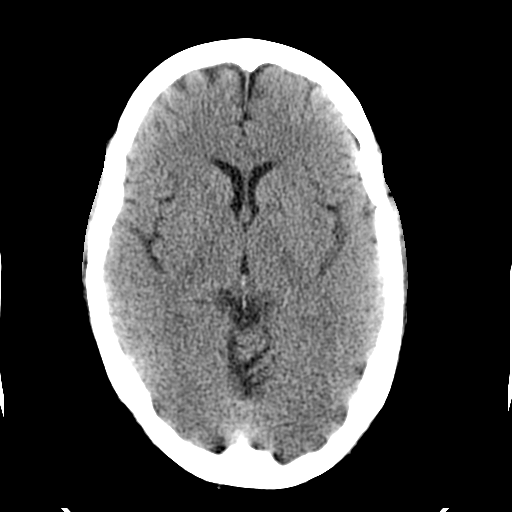
[im 15/30  brain]
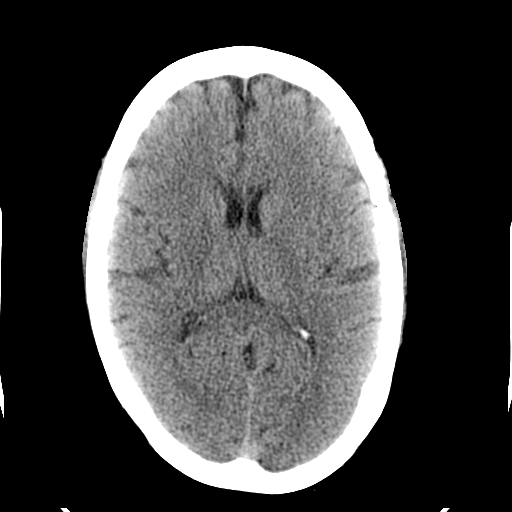
[im 16/30  brain]
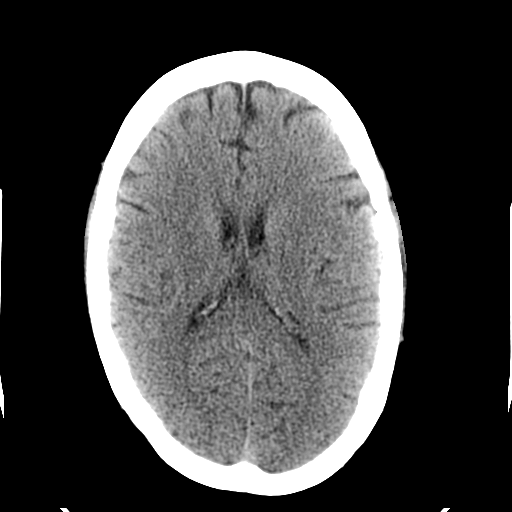
[im 16/30  bone]
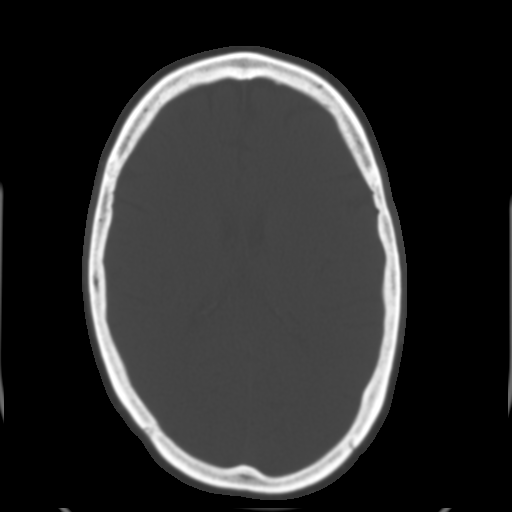
[im 18/30  brain]
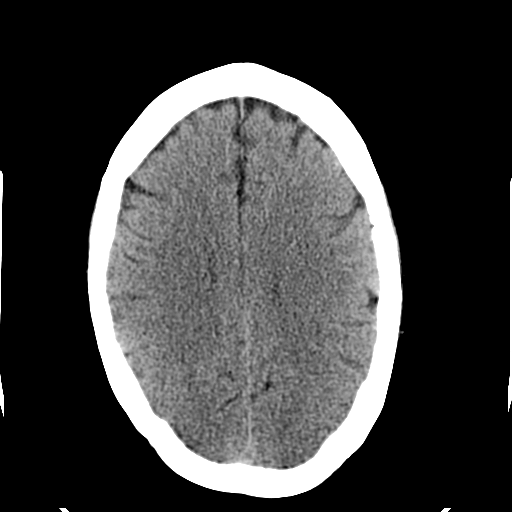
[im 20/30  brain]
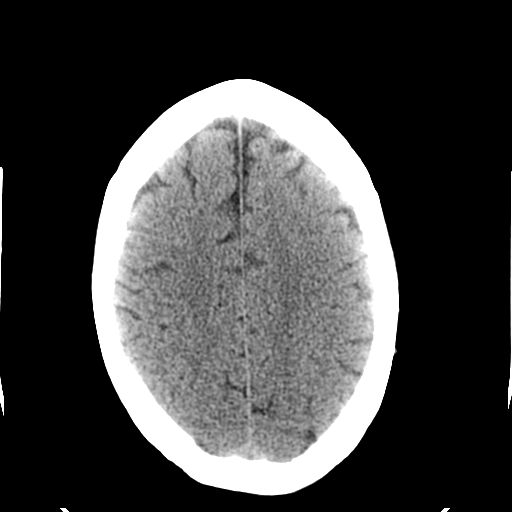
[im 22/30  brain]
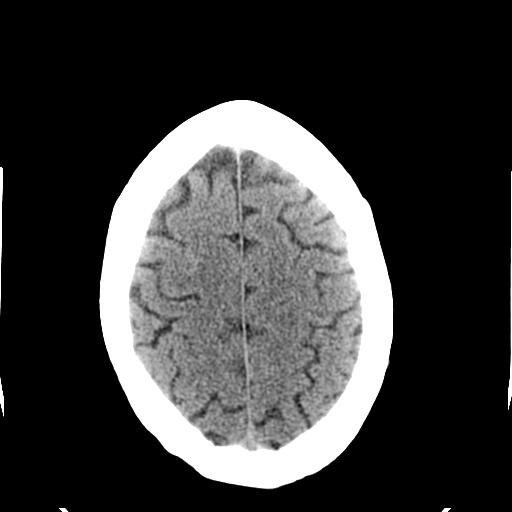
[im 23/30  brain]
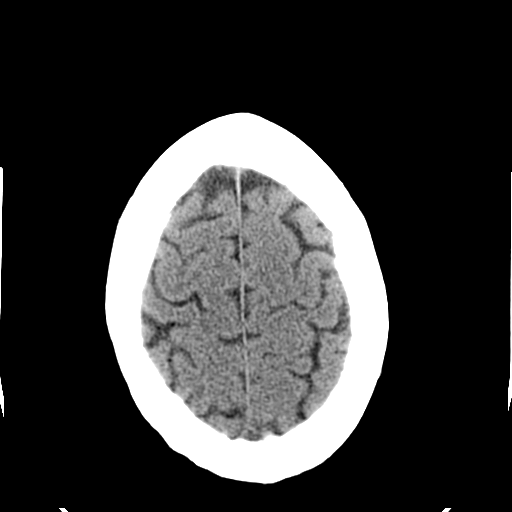
[im 23/30  bone]
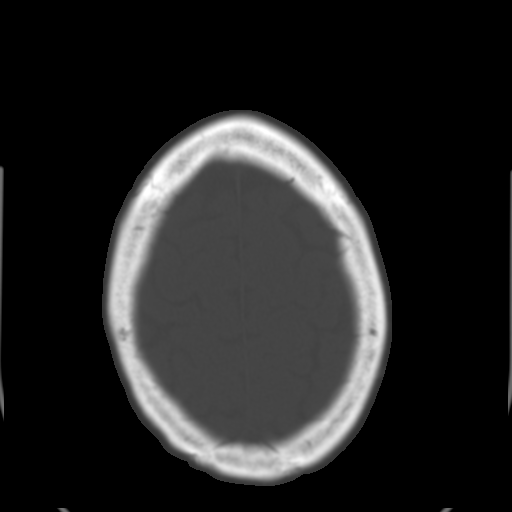
[im 25/30  brain]
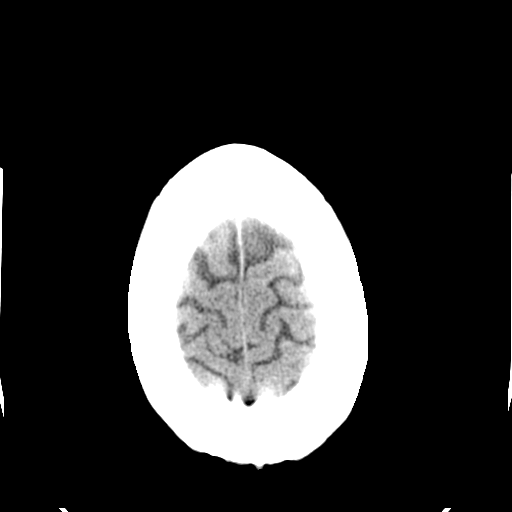
[im 27/30  brain]
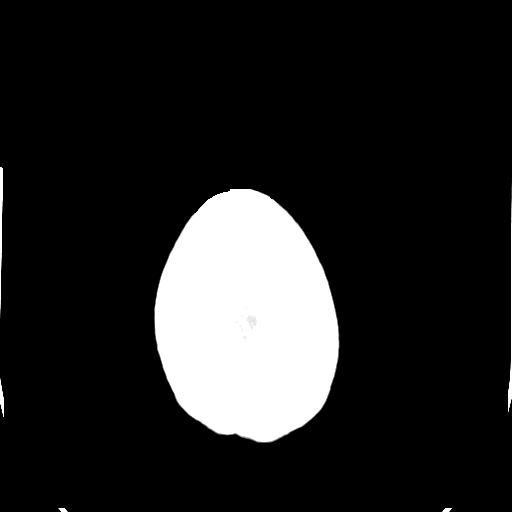
[im 29/30  brain]
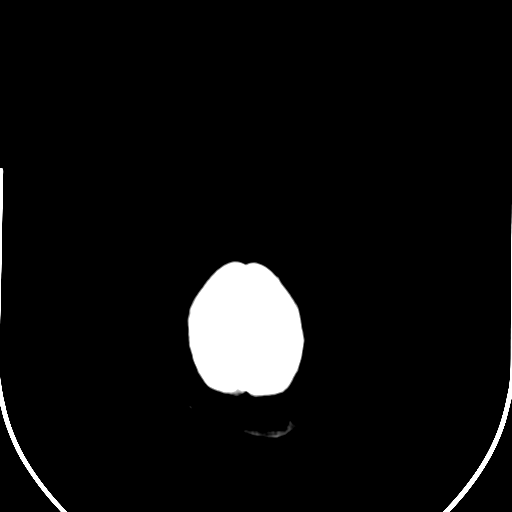

[16 of 30 positions shown; findings below may reference images not displayed]

FINDINGS: Redemonstrated mild periventricular and bilateral basal
ganglia hypodensities, left greater than right compatible with
chronic microvascular ischemic disease.  Otherwise, the gray-white
differentiation is maintained.  No CT evidence of acute large
territory infarct.  No intraparenchymal or extra-axial mass or
hemorrhage.  Unchanged size and configuration of the ventricles and
basilar cisterns.  No midline shift.  Vascular calcifications.  The
paranasal sinuses mastoid air cells are normal.  No fracture.
Regional soft tissues are normal.
IMPRESSION: Stable sequela of mild microvascular ischemic disease without
superimposed acute intraparenchymal process.

## 2013-04-09 ENCOUNTER — Other Ambulatory Visit: Payer: Self-pay | Admitting: Cardiovascular Disease

## 2013-04-09 LAB — PACEMAKER DEVICE OBSERVATION

## 2013-04-29 LAB — REMOTE PACEMAKER DEVICE
AL AMPLITUDE: 3.1 mv
AL IMPEDENCE PM: 490 Ohm
AL THRESHOLD: 0.625 V
ATRIAL PACING PM: 43
BAMS-0001: 170 {beats}/min
BAMS-0003: 70 {beats}/min
BATTERY VOLTAGE: 2.95 V
DEVICE MODEL PM: 7250678
RV LEAD AMPLITUDE: 4.1 mv
RV LEAD IMPEDENCE PM: 510 Ohm
RV LEAD THRESHOLD: 0.875 V
VENTRICULAR PACING PM: 1

## 2013-05-04 ENCOUNTER — Encounter: Payer: Self-pay | Admitting: *Deleted

## 2013-05-07 ENCOUNTER — Encounter: Payer: Self-pay | Admitting: Cardiovascular Disease

## 2013-05-11 ENCOUNTER — Encounter: Payer: Self-pay | Admitting: *Deleted

## 2013-05-11 ENCOUNTER — Ambulatory Visit (INDEPENDENT_AMBULATORY_CARE_PROVIDER_SITE_OTHER): Payer: Managed Care, Other (non HMO) | Admitting: Cardiovascular Disease

## 2013-05-11 ENCOUNTER — Other Ambulatory Visit: Payer: Self-pay | Admitting: Cardiovascular Disease

## 2013-05-11 ENCOUNTER — Encounter: Payer: Self-pay | Admitting: Cardiovascular Disease

## 2013-05-11 VITALS — BP 116/62 | HR 59 | Ht 62.0 in | Wt 137.5 lb

## 2013-05-11 DIAGNOSIS — Z45010 Encounter for checking and testing of cardiac pacemaker pulse generator [battery]: Secondary | ICD-10-CM | POA: Insufficient documentation

## 2013-05-11 DIAGNOSIS — R001 Bradycardia, unspecified: Secondary | ICD-10-CM | POA: Insufficient documentation

## 2013-05-11 DIAGNOSIS — Z950 Presence of cardiac pacemaker: Secondary | ICD-10-CM

## 2013-05-11 DIAGNOSIS — F411 Generalized anxiety disorder: Secondary | ICD-10-CM

## 2013-05-11 DIAGNOSIS — I495 Sick sinus syndrome: Secondary | ICD-10-CM

## 2013-05-11 DIAGNOSIS — I471 Supraventricular tachycardia: Secondary | ICD-10-CM

## 2013-05-11 LAB — PACEMAKER DEVICE OBSERVATION
AL AMPLITUDE: 4.1 mv
AL IMPEDENCE PM: 460 Ohm
AL THRESHOLD: 0.625 V
ATRIAL PACING PM: 44
BAMS-0001: 170 {beats}/min
BAMS-0003: 70 {beats}/min
DEVICE MODEL PM: 7250678
RV LEAD AMPLITUDE: 5 mv
RV LEAD IMPEDENCE PM: 490 Ohm
RV LEAD THRESHOLD: 0.875 V
VENTRICULAR PACING PM: 1

## 2013-05-11 NOTE — Assessment & Plan Note (Signed)
Generally brief, she has had one sustained episode of SVT lasting about 9 minutes, probably ectopic atrial tachycardia. Other episodes are too brief to define (4-6 seconds). No atrial fibrillation has ever been documented by her pacemaker. I do not think adjustment of meds is necessary based on the low arrhythmia prevalence. If it becomes more of an issue , would increase beta blockers.

## 2013-05-11 NOTE — Assessment & Plan Note (Signed)
St Jude Accent DR RF, implanted August 2012. Normal device function (see separate device note). She can use it as an arrhythmia monitor and call our office to review episodes of palpitations.

## 2013-05-11 NOTE — Patient Instructions (Signed)
Follow up in 1 year. Do an extra remote pacemaker transmission when problems arise. Automatic transmissions will take place every 3 months.

## 2013-05-11 NOTE — Progress Notes (Signed)
Pacemaker check in clinic. Normal device function. No changes were made. [facility] will continue over the next year, and then follow up will be made with Dr. Royann Shivers.

## 2013-05-11 NOTE — Assessment & Plan Note (Signed)
She remains generally anxious and sometimes she focuses on her heart problems. I tried to reassure her that they are not particularly threatening.

## 2013-05-11 NOTE — Progress Notes (Signed)
Patient ID: [name], female   DOB: 21-Dec-1950, 62 y.o.   MRN: [id number]   Reason for office visit Palpitations  She felt her heart racing and worried that there was a problem with her pacemaker. Device interrogation shows normal function and a few episodes of paroxysmal atrial tachycardia. The arrhythmia made her worry, but she did not feel otherwise unwell.  Allergies  Allergen Reactions  . Ampicillin     REACTION: UNKNOWN REACTION  . Iodine Swelling    Tongue swelling. Difficulty breathing. Contrast for imaging study.    Current Outpatient Prescriptions  Medication Sig Dispense Refill  . aspirin 81 MG tablet Take 81 mg by mouth every other day.       . DULoxetine (CYMBALTA) 20 MG capsule Take 20 mg by mouth daily.      . fish oil-omega-3 fatty acids 1000 MG capsule Take 2 g by mouth daily.        Marland Kitchen HYDROcodone-acetaminophen (LORTAB) 10-500 MG per tablet Take 1 tablet by mouth every 6 (six) hours as needed.       . Multiple Vitamin (MULTIVITAMIN) tablet Take 1 tablet by mouth daily.      . vitamin B-12 (CYANOCOBALAMIN) 250 MCG tablet Take 250 mcg by mouth daily.      . vitamin C (ASCORBIC ACID) 500 MG tablet Take 500 mg by mouth daily.      Marland Kitchen zolpidem (AMBIEN) 10 MG tablet 10 mg at bedtime as needed.       . ALPRAZolam (XANAX) 0.5 MG tablet       . estradiol (ESTRACE) 1 MG tablet       . Vitamin D, Ergocalciferol, (DRISDOL) 50000 UNITS CAPS        No current facility-administered medications for this visit.    Past Medical History  Diagnosis Date  . Osteoarthritis   . Anxiety   . Diverticulosis of colon   . Osteoporosis   . Tachy-brady syndrome   . Paroxysmal atrial fibrillation     with SVT response  . Sinus node dysfunction     Past Surgical History  Procedure Laterality Date  . S/p hysterectomy    . Cholecystectomy    . Appendectomy    . Cervical spine surgery      two  . Vocal cord polypectomy    . Breast biopsy      right, benign  . Colonoscopy   2007    sigmoid diverticula, few ulcerations of terminal ileum, biopsies of TI unremarkable, random colon bx neg for microscopic colitis.  . Sb capsule study  2009    Two single small nonbleeding AVMs in the  proximal small bowel, single lymphangiectasia in mid small bowel.  . Permanent pacemaker insertion  07/2011    Family History  Problem Relation Age of Onset  . GI problems Sister     had colostomy (infection)  . Colon cancer Neg Hx   . Inflammatory bowel disease Neg Hx   . Stroke Father 65  . Heart attack Father 2  . COPD Mother 52  . Hypertension Brother   . Diabetes Brother     History   Social History  . Marital Status: Divorced    Spouse Name: N/A    Number of Children: N/A  . Years of Education: N/A   Occupational History  . [employer]    Social History Main Topics  . Smoking status: Never Smoker   . Smokeless tobacco: Not on file  . Alcohol Use: No  .  Drug Use: No  . Sexually Active: Not on file   Other Topics Concern  . Not on file   Social History Narrative   Has one son    Review of systems: The patient specifically denies any chest pain at rest exertion, dyspnea at rest or with exertion, orthopnea, paroxysmal nocturnal dyspnea, syncope, focal neurological deficits, intermittent claudication, lower extremity edema, unexplained weight gain, cough, hemoptysis or wheezing. She denies intolerance to heat/cold, tremor, weight loss, polyuria, polydipsia, abdominal pain, change in bowel habits, bleeding, rashes, fever, chills, dysuria or vaginal bleeding, modd swings or sleep problems.   PHYSICAL EXAM BP 116/62  Pulse 59  Ht 5\' 2"  (1.575 m)  Wt 137 lb 8 oz (62.37 kg)  BMI 25.14 kg/m2  General: Alert, oriented x3, no distress; thin Head: no evidence of trauma, PERRL, EOMI, no exophtalmos or lid lag, no myxedema, no xanthelasma; normal ears, nose and oropharynx Neck: normal jugular venous pulsations and no hepatojugular reflux; brisk carotid pulses without  delay and no carotid bruits Chest: clear to auscultation, no signs of consolidation by percussion or palpation, normal fremitus, symmetrical and full respiratory excursions; healthy left subclavian pacemaker site. Cardiovascular: normal position and quality of the apical impulse, regular rhythm, normal first and second heart sounds, no murmurs, rubs or gallops Abdomen: no tenderness or distention, no masses by palpation, no abnormal pulsatility or arterial bruits, normal bowel sounds, no hepatosplenomegaly Extremities: no clubbing, cyanosis or edema; 2+ radial, ulnar and brachial pulses bilaterally; 2+ right femoral, posterior tibial and dorsalis pedis pulses; 2+ left femoral, posterior tibial and dorsalis pedis pulses; no subclavian or femoral bruits Neurological: grossly nonfocal   EKG: Atrial paced, ventricualr sensed. Normal otherwise    Lipid Panel     Component Value Date/Time   CHOL 156 07/17/2011 0605   TRIG 107 07/17/2011 0605   HDL 52 07/17/2011 0605   CHOLHDL 3.0 07/17/2011 0605   VLDL 21 07/17/2011 0605   LDLCALC 83 07/17/2011 0605    BMET    Component Value Date/Time   NA 142 07/17/2011 0605   K 4.0 07/17/2011 0605   CL 109 07/17/2011 0605   CO2 27 07/17/2011 0605   GLUCOSE 97 07/17/2011 0605   BUN 7 07/17/2011 0605   CREATININE 0.60 07/17/2011 0605   CREATININE 0.70 06/03/2011 0853   CALCIUM 8.8 07/17/2011 0605   GFRNONAA >60 07/17/2011 0605   GFRAA >60 07/17/2011 0605     ASSESSMENT AND PLAN  Paroxysmal SVT (supraventricular tachycardia) Generally brief, she has had one sustained episode of SVT lasting about 9 minutes, probably ectopic atrial tachycardia. Other episodes are too brief to define (4-6 seconds). No atrial fibrillation has ever been documented by her pacemaker. I do not think adjustment of meds is necessary based on the low arrhythmia prevalence. If it becomes more of an issue , would increase beta blockers.  Pacemaker [employer] DR RF, implanted August 2012. Normal device  function (see separate device note). She can use it as an arrhythmia monitor and call our office to review episodes of palpitations.  ANXIETY She remains generally anxious and sometimes she focuses on her heart problems. I tried to reassure her that they are not particularly threatening.  Note good lipid profile when last evaluated.   Junious Silk, MD, [facility] [phone] office [phone] pager

## 2013-06-20 ENCOUNTER — Other Ambulatory Visit: Payer: Self-pay | Admitting: Cardiovascular Disease

## 2013-06-20 LAB — PACEMAKER DEVICE OBSERVATION

## 2013-07-06 ENCOUNTER — Other Ambulatory Visit (HOSPITAL_COMMUNITY): Payer: Self-pay | Admitting: Physician Assistant

## 2013-07-06 DIAGNOSIS — Z139 Encounter for screening, unspecified: Secondary | ICD-10-CM

## 2013-07-07 ENCOUNTER — Ambulatory Visit (INDEPENDENT_AMBULATORY_CARE_PROVIDER_SITE_OTHER): Payer: Managed Care, Other (non HMO) | Admitting: Gastroenterology

## 2013-07-07 VITALS — BP 106/64 | HR 77 | Temp 98.7°F | Ht 62.0 in | Wt 131.6 lb

## 2013-07-07 DIAGNOSIS — K649 Unspecified hemorrhoids: Secondary | ICD-10-CM | POA: Insufficient documentation

## 2013-07-07 DIAGNOSIS — R197 Diarrhea, unspecified: Secondary | ICD-10-CM

## 2013-07-07 DIAGNOSIS — K625 Hemorrhage of anus and rectum: Secondary | ICD-10-CM | POA: Insufficient documentation

## 2013-07-07 MED ORDER — HYDROCORTISONE 2.5 % RE CREA: TOPICAL_CREAM | RECTAL | Status: DC

## 2013-07-07 NOTE — Patient Instructions (Addendum)
1. Please collect stools and return to the lab as soon as possible. 2. Please have your blood work done. 3. You may ultimately need another colonoscopy, await test results first. 4.  Hold aspirin for now. 5. Apply Anusol cream anorectally twice a day for 10 days. You have some anorectal irritation/skin tag/hemorrhoid noted on exam today. Prescription sent to her pharmacy.

## 2013-07-07 NOTE — Progress Notes (Signed)
Primary Care Physician: Cassell Smiles., MD  Primary Gastroenterologist:  Roetta Sessions, MD   Chief Complaint  Patient presents with  . Diarrhea  . Rectal Bleeding  . Abdominal Pain    HPI: [name] is a 62 y.o. female here for complaints of rectal bleeding, diarrhea, abdominal pain. Back in June 2012 she underwent an EGD with ileocolonoscopy for chronic intermittent diarrhea, abdominal pain, weight loss. She had diffuse petechial and gastric submucosal petechiae, biopsies were unremarkable. Small bowel biopsy negative for celiac disease. She had pancolonic diverticula. A solitary ulcer at the ileocecal valve with benign biopsy, 15 cm of the distal terminal ileum appeared normal. Random colon biopsies were negative. At that time it was felt that the ulcer may been secondary to aspirin use. She was given a one-month course of Entocort 6 mg daily because of ongoing diarrhea and the question of possible underlying/evolving inflammatory bowel disease. Patient no showed for her followup office visit.  Patient tells them that she did not come to her followup appointment because of Entocort seem to help at that time. She was doing pretty well until several months ago. She's had some intermittent diarrhea just couple stools per day. Some days stools are more formed and occasionally has a hard stool. For the past one week however she started having daily diarrhea 3-4 times per day. Feels like intestines irritated from diarrhea. Yesterday started seeing fresh blood in the stool. Has had seen several episodes, lightening up today. Blood has been in the stool and on the tissue. No N/V. No heartburn. Takes ASA 81mg  but not daily. No other NSAIDs. Weight is down about 8 pounds since we last saw her. She states she's under increased stress because she recently lost her job. Her medical insurance and was on July 31. She will have to figure out what to do with regards to medical insurance based on her  budget.  Current Outpatient Prescriptions  Medication Sig Dispense Refill  . ALPRAZolam (XANAX) 0.5 MG tablet       . aspirin 81 MG tablet Take 81 mg by mouth every other day.       . DULoxetine (CYMBALTA) 20 MG capsule Take 20 mg by mouth daily.      . fish oil-omega-3 fatty acids 1000 MG capsule Take 2 g by mouth daily.        Marland Kitchen HYDROcodone-acetaminophen (LORTAB) 10-500 MG per tablet Take 1 tablet by mouth every 6 (six) hours as needed.       . Multiple Vitamin (MULTIVITAMIN) tablet Take 1 tablet by mouth daily.      . vitamin B-12 (CYANOCOBALAMIN) 250 MCG tablet Take 250 mcg by mouth daily.      . vitamin C (ASCORBIC ACID) 500 MG tablet Take 500 mg by mouth daily.      Marland Kitchen zolpidem (AMBIEN) 10 MG tablet 10 mg at bedtime as needed.        No current facility-administered medications for this visit.    Allergies as of 07/07/2013 - Review Complete 07/07/2013  Allergen Reaction Noted  . Ampicillin    . Iodine Swelling    Past Medical History  Diagnosis Date  . Osteoarthritis   . Anxiety   . Diverticulosis of colon   . Osteoporosis   . Tachy-brady syndrome   . Paroxysmal atrial fibrillation     with SVT response  . Sinus node dysfunction    Past Surgical History  Procedure Laterality Date  . S/p hysterectomy    .  Cholecystectomy    . Appendectomy    . Cervical spine surgery      two  . Vocal cord polypectomy    . Breast biopsy      right, benign  . Colonoscopy  2007    sigmoid diverticula, few ulcerations of terminal ileum, biopsies of TI unremarkable, random colon bx neg for microscopic colitis.  . Sb capsule study  2009    Two single small nonbleeding AVMs in the  proximal small bowel, single lymphangiectasia in mid small bowel.  . Permanent pacemaker insertion  07/2011  . Esophagogastroduodenoscopy  06/04/11    Dr. Elmer Ramp esophagus, deffuse petechial and gastric submucosal petechiae- benign mucousa on bx. Small bowel biopsy benign. No H. pylori.  . Colonoscopy   06/04/11    Dr. Jena Gauss- pegmentation of the rectum, pancolonic diverticula, solitary ulcer in the mouth of the ileocecal valve, distal 15 cm of the terminal ileum appeared normal. Random colon biopsies were benign. Ileocecal valve ulcer or was benign without any supportive evidence of inflammatory bowel disease.   Family History  Problem Relation Age of Onset  . GI problems Sister     had colostomy (infection)  . Colon cancer Neg Hx   . Inflammatory bowel disease Neg Hx   . Stroke Father 43  . Heart attack Father 63  . COPD Mother 66  . Hypertension Brother   . Diabetes Brother    History   Social History  . Marital Status: Divorced    Spouse Name: N/A    Number of Children: N/A  . Years of Education: N/A   Occupational History  . [employer]    Social History Main Topics  . Smoking status: Never Smoker   . Smokeless tobacco: None  . Alcohol Use: No  . Drug Use: No  . Sexually Active: None   Other Topics Concern  . None   Social History Narrative   Has one son    ROS:  General: Negative for anorexia, weight loss, fever, chills, fatigue, weakness. ENT: Negative for hoarseness, difficulty swallowing , nasal congestion. CV: Negative for chest pain, angina, palpitations, dyspnea on exertion, peripheral edema.  Respiratory: Negative for dyspnea at rest, dyspnea on exertion, cough, sputum, wheezing.  GI: See history of present illness. GU:  Negative for dysuria, hematuria, urinary incontinence, urinary frequency, nocturnal urination.  Endo: Negative for unusual weight change.    Physical Examination:   BP 106/64  Pulse 77  Temp(Src) 98.7 F (37.1 C) (Oral)  Ht 5\' 2"  (1.575 m)  Wt 131 lb 9.6 oz (59.693 kg)  BMI 24.06 kg/m2  General: Well-nourished, well-developed in no acute distress. Tearful at times. Eyes: No icterus. Mouth: Oropharyngeal mucosa moist and pink , no lesions erythema or exudate. Lungs: Clear to auscultation bilaterally.  Heart: Regular rate and  rhythm, no murmurs rubs or gallops.  Abdomen: Bowel sounds are normal, nontender, nondistended, no hepatosplenomegaly or masses, no abdominal bruits or hernia , no rebound or guarding.   Rectal exam: Small external skin tag/hemorrhoid with mild erythema. Digital rectal exam otherwise unremarkable. Round stool Hemoccult negative. Extremities: No lower extremity edema. No clubbing or deformities. Neuro: Alert and oriented x 4   Skin: Warm and dry, no jaundice.   Psych: Alert and cooperative, normal mood and affect.

## 2013-07-07 NOTE — Progress Notes (Signed)
Cc PCP 

## 2013-07-07 NOTE — Assessment & Plan Note (Signed)
Patient presents with acute on chronic diarrhea associated with bloody stools. Diarrhea more persistent over the past one week. Started seeing blood yesterday. Seems to be lightening up at this time. On digital rectal exam she had a small erythematous skin tag/hemorrhoid, stool was brown and Hemoccult negative. Abdominal exam benign. Previous workup in 2007 showed a couple of terminal ileal ulcers with benign biopsies. Capsule endoscopy showed small bowel AVMs, nonbleeding. She had another colonoscopy and EGD in 2012 for ongoing symptoms and found to have an ileocecal valve ulcer with benign biopsy at that time. Terminal ileum appeared normal. Random colon biopsies were negative. It is felt that her findings could be due to aspirin use but involving Crohn's disease not excluded.  CBC. Stool culture, C. difficile PCR, lactoferrin, Giardia. Patient may need to have repeat colonoscopy but will discuss further with Dr. Jena Gauss. Due to her labs in insurance coverage after July 31, we had saved the date in case.

## 2013-07-08 ENCOUNTER — Other Ambulatory Visit: Payer: Self-pay | Admitting: Gastroenterology

## 2013-07-08 ENCOUNTER — Ambulatory Visit (HOSPITAL_COMMUNITY)
Admission: RE | Admit: 2013-07-08 | Discharge: 2013-07-08 | Disposition: A | Discharge status: Home / Self Care | Payer: Managed Care, Other (non HMO) | Source: Ambulatory Visit | Attending: Physician Assistant | Admitting: Physician Assistant

## 2013-07-08 ENCOUNTER — Telehealth: Payer: Self-pay | Admitting: Gastroenterology

## 2013-07-08 DIAGNOSIS — Z139 Encounter for screening, unspecified: Secondary | ICD-10-CM

## 2013-07-08 DIAGNOSIS — Z1231 Encounter for screening mammogram for malignant neoplasm of breast: Secondary | ICD-10-CM | POA: Insufficient documentation

## 2013-07-08 LAB — CBC WITH DIFFERENTIAL/PLATELET
Basophils Absolute: 0.1 10*3/uL (ref 0.0–0.1)
Basophils Relative: 1 % (ref 0–1)
Eosinophils Absolute: 0.1 10*3/uL (ref 0.0–0.7)
Eosinophils Relative: 2 % (ref 0–5)
HCT: 40.7 % (ref 36.0–46.0)
Hemoglobin: 13.7 g/dL (ref 12.0–15.0)
Lymphocytes Relative: 36 % (ref 12–46)
Lymphs Abs: 1.7 10*3/uL (ref 0.7–4.0)
MCH: 32.5 pg (ref 26.0–34.0)
MCHC: 33.7 g/dL (ref 30.0–36.0)
MCV: 96.7 fL (ref 78.0–100.0)
Monocytes Absolute: 0.4 10*3/uL (ref 0.1–1.0)
Monocytes Relative: 9 % (ref 3–12)
Neutro Abs: 2.5 10*3/uL (ref 1.7–7.7)
Neutrophils Relative %: 52 % (ref 43–77)
Platelets: 253 10*3/uL (ref 150–400)
RBC: 4.21 MIL/uL (ref 3.87–5.11)
RDW: 12.5 % (ref 11.5–15.5)
WBC: 4.7 10*3/uL (ref 4.0–10.5)

## 2013-07-08 LAB — CLOSTRIDIUM DIFFICILE BY PCR: Toxigenic C. Difficile by PCR: NOT DETECTED

## 2013-07-08 LAB — GIARDIA/CRYPTOSPORIDIUM (EIA)
Cryptosporidium Screen (EIA): NEGATIVE
Giardia Screen (EIA): NEGATIVE

## 2013-07-08 LAB — FECAL LACTOFERRIN, QUANT: Lactoferrin: NEGATIVE

## 2013-07-08 MED ORDER — DICYCLOMINE HCL 10 MG PO CAPS
10.0000 mg | ORAL_CAPSULE | Freq: Three times a day (TID) | ORAL | Status: DC | PRN
Start: 2013-07-08 — End: 2014-07-04

## 2013-07-08 NOTE — Progress Notes (Signed)
Quick Note:  Await stool culture ______

## 2013-07-08 NOTE — Progress Notes (Signed)
Quick Note:  CBC normal. Await stools.  Please let patient know, I discussed with Dr. Jena Gauss. He recommends plan as outlined to include lab, stool studies, treat hemorrhoids. He recommends adding antispasmotic, RX sent for Bentyl to take prn loose stool. He DOES NOT recommend colonoscopy at this time even if stool studies turn out negative. If no response to Bentyl and stools are negative, he would empirically treat for microscopic colitis/IBD based on prior TCSs WITH Uceris. She would need to decide this before 07/14/13 due to lapse in insurance coverage at that time.  After trying Bentyl, if she needs Uceris you can call in 9mg  po daily, #30, no refills. Give rebate card. You can also give 10 days of samples. OV in 8 weeks.   Routing to [place] as FYI in case any questions arise while I am gone next week. ______

## 2013-07-08 NOTE — Progress Notes (Signed)
If requires Entocort or Uceris, per RMR, would give 2 months then taper over 2 months. If no response or develops recurrence off of medication, THEN would offer colonoscopy.

## 2013-07-08 NOTE — Telephone Encounter (Signed)
Please see CBC result note. FYI, if insurance will not cover uceris as sent in (to fill only if does not respond to bentyl and stools are negative), then Entocort 9mg  po daily for 30 days with no refills OKAY.

## 2013-07-11 LAB — STOOL CULTURE

## 2013-07-11 NOTE — Telephone Encounter (Signed)
Pt stated she was doing well with Bentyl at this time.

## 2013-07-11 NOTE — Progress Notes (Signed)
Quick Note:  Let's see how patient is doing on Bentyl. Needs to call us tomorrow or Wednesday at the latest. If no improvement, we will proceed with Uceris. ______

## 2013-08-02 LAB — REMOTE PACEMAKER DEVICE
AL AMPLITUDE: 3.2 mv
AL IMPEDENCE PM: 450 Ohm
AL THRESHOLD: 0.625 V
ATRIAL PACING PM: 50
BAMS-0001: 170 {beats}/min
BAMS-0003: 70 {beats}/min
BATTERY VOLTAGE: 2.93 V
DEVICE MODEL PM: 7250678
RV LEAD AMPLITUDE: 4 mv
RV LEAD IMPEDENCE PM: 460 Ohm
RV LEAD THRESHOLD: 0.75 V
VENTRICULAR PACING PM: 1

## 2013-08-20 ENCOUNTER — Other Ambulatory Visit: Payer: Self-pay | Admitting: Cardiovascular Disease

## 2013-08-20 DIAGNOSIS — I495 Sick sinus syndrome: Secondary | ICD-10-CM

## 2013-08-20 LAB — PACEMAKER DEVICE OBSERVATION

## 2013-09-06 ENCOUNTER — Encounter: Payer: Self-pay | Admitting: *Deleted

## 2013-09-06 LAB — REMOTE PACEMAKER DEVICE
AL AMPLITUDE: 4.1 mv
AL IMPEDENCE PM: 450 Ohm
AL THRESHOLD: 0.625 V
ATRIAL PACING PM: 44
BAMS-0001: 170 {beats}/min
BAMS-0003: 70 {beats}/min
BATTERY VOLTAGE: 2.95 V
DEVICE MODEL PM: 7250678
RV LEAD AMPLITUDE: 4 mv
RV LEAD IMPEDENCE PM: 540 Ohm
RV LEAD THRESHOLD: 0.875 V
VENTRICULAR PACING PM: 1

## 2013-09-24 ENCOUNTER — Other Ambulatory Visit: Payer: Self-pay | Admitting: Cardiovascular Disease

## 2013-09-24 LAB — PACEMAKER DEVICE OBSERVATION

## 2013-09-27 LAB — REMOTE PACEMAKER DEVICE
AL AMPLITUDE: 2.3 mv
AL IMPEDENCE PM: 450 Ohm
AL THRESHOLD: 0.625 V
ATRIAL PACING PM: 46
BAMS-0001: 170 {beats}/min
BAMS-0003: 70 {beats}/min
BATTERY VOLTAGE: 2.95 V
DEVICE MODEL PM: 7250678
RV LEAD AMPLITUDE: 3.2 mv
RV LEAD IMPEDENCE PM: 460 Ohm
RV LEAD THRESHOLD: 0.875 V
VENTRICULAR PACING PM: 1

## 2013-12-14 ENCOUNTER — Other Ambulatory Visit (HOSPITAL_COMMUNITY): Payer: Self-pay | Admitting: Internal Medicine

## 2013-12-14 DIAGNOSIS — IMO0002 Reserved for concepts with insufficient information to code with codable children: Secondary | ICD-10-CM

## 2013-12-19 ENCOUNTER — Ambulatory Visit (HOSPITAL_COMMUNITY)
Admission: RE | Admit: 2013-12-19 | Discharge: 2013-12-19 | Disposition: A | Discharge status: Home / Self Care | Payer: BC Managed Care – PPO | Source: Ambulatory Visit | Attending: Internal Medicine | Admitting: Internal Medicine

## 2013-12-19 DIAGNOSIS — M5126 Other intervertebral disc displacement, lumbar region: Secondary | ICD-10-CM | POA: Insufficient documentation

## 2013-12-19 DIAGNOSIS — M51379 Other intervertebral disc degeneration, lumbosacral region without mention of lumbar back pain or lower extremity pain: Secondary | ICD-10-CM | POA: Insufficient documentation

## 2013-12-19 DIAGNOSIS — M545 Low back pain, unspecified: Secondary | ICD-10-CM | POA: Insufficient documentation

## 2013-12-19 DIAGNOSIS — M5137 Other intervertebral disc degeneration, lumbosacral region: Secondary | ICD-10-CM | POA: Insufficient documentation

## 2013-12-19 DIAGNOSIS — IMO0002 Reserved for concepts with insufficient information to code with codable children: Secondary | ICD-10-CM

## 2013-12-22 ENCOUNTER — Encounter: Payer: Managed Care, Other (non HMO) | Admitting: *Deleted

## 2013-12-28 ENCOUNTER — Encounter: Payer: Self-pay | Admitting: *Deleted

## 2014-02-08 ENCOUNTER — Ambulatory Visit (HOSPITAL_COMMUNITY): Payer: Managed Care, Other (non HMO) | Admitting: Physical Therapy

## 2014-02-22 ENCOUNTER — Ambulatory Visit (HOSPITAL_COMMUNITY)
Admission: RE | Admit: 2014-02-22 | Discharge: 2014-02-22 | Disposition: A | Discharge status: Home / Self Care | Payer: BC Managed Care – PPO | Source: Ambulatory Visit | Attending: Neurosurgery | Admitting: Neurosurgery

## 2014-02-22 DIAGNOSIS — M545 Low back pain, unspecified: Secondary | ICD-10-CM | POA: Insufficient documentation

## 2014-02-22 DIAGNOSIS — R262 Difficulty in walking, not elsewhere classified: Secondary | ICD-10-CM | POA: Insufficient documentation

## 2014-02-22 DIAGNOSIS — IMO0001 Reserved for inherently not codable concepts without codable children: Secondary | ICD-10-CM | POA: Insufficient documentation

## 2014-02-22 NOTE — Evaluation (Signed)
Physical Therapy Evaluation  Patient Details  Name: [name] MRN: [id number] Date of Birth: 19-Jan-1951  Today's Date: 02/22/2014 Time: 1100-1150 PT Time Calculation (min): 50 min Charges: 1 evaluation, Therapeutic Exercise 1135-1150              Visit#: 1 of 16  Re-eval: 03/24/14 Assessment Diagnosis: low back pain secondary to hip malalignment and limited abdominal bracing resulting in difficulty with prolonged sitting.  Next MD Visit: 03/08/14 Prior Therapy: no  Authorization: [employer]    Authorization Visit#: 1 of 16   Past Medical History:  Past Medical History  Diagnosis Date  . Osteoarthritis   . Anxiety   . Diverticulosis of colon   . Osteoporosis   . Tachy-brady syndrome   . Paroxysmal atrial fibrillation     with SVT response  . Sinus node dysfunction    Past Surgical History:  Past Surgical History  Procedure Laterality Date  . S/p hysterectomy    . Cholecystectomy    . Appendectomy    . Cervical spine surgery      two  . Vocal cord polypectomy    . Breast biopsy      right, benign  . Colonoscopy  2007    sigmoid diverticula, few ulcerations of terminal ileum, biopsies of TI unremarkable, random colon bx neg for microscopic colitis.  . Sb capsule study  2009    Two single small nonbleeding AVMs in the  proximal small bowel, single lymphangiectasia in mid small bowel.  . Permanent pacemaker insertion  07/2011  . Esophagogastroduodenoscopy  06/04/11    Dr. Vivi Ferns esophagus, deffuse petechial and gastric submucosal petechiae- benign mucousa on bx. Small bowel biopsy benign. No H. pylori.  . Colonoscopy  06/04/11    Dr. Gala Romney- pegmentation of the rectum, pancolonic diverticula, solitary ulcer in the mouth of the ileocecal valve, distal 15 cm of the terminal ileum appeared normal. Random colon biopsies were benign. Ileocecal valve ulcer or was benign without any supportive evidence of inflammatory bowel disease.    Subjective Symptoms/Limitations Symptoms: Low back pain secondary to prolonged sitting >62min. Pain described as a deep ache, soreness. Low back pain radiates into bilateral posterior hip and thigh; in her Rt LE it radiated to foot with N/T on Dorsum of Rt foot. Easing factors include walking and moving. Patient state3s no cervical pain or head aches.  Pertinent History: Patient is a 63 y/o Female who present to therapy with a long history of back pain (>3 years), with cuurent episdoe beginning 12/25/13, Patient recieved a cortizone injection 02/09/14 at a hospital in [place] since when she has noticed decreased LE pain but continued, though improved back pain.  Limitations: Sitting How long can you sit comfortably?: <37minutes How long can you stand comfortably?: WFL How long can you walk comfortably?: WFL Patient Stated Goals:  to be able to sleep through night with waking less than 1 time, and and to be able to do normal household ADL without pain Pain Assessment Currently in Pain?: Yes Pain Score: 5  Pain Location: Back Pain Type: Chronic pain Pain Radiating Towards: Bilateral LE (further raditating in R LE) Pain Onset: More than a month ago Pain Frequency: Intermittent Pain Relieving Factors: changing position Effect of Pain on Daily Activities: difficulty itting and sleeping.  Balance Screening Balance Screen Has the patient fallen in the past 6 months:  (patient fell 1x > 1 year ago)  Prior Functioning  Prior Function Level of Independence: Independent with basic ADLs  Vocation: Unemployed Leisure: Hobbies-yes (Comment) Comments: Patient gardens,cleans, amd fwalks  Cognition/Observation Observation/Other Assessments Observations: Gait: Patient ambulates with shortened stride length,, limited hip extension and abduction, and  limited pelvic rotation. (Gait is non-painful except with walk with toes pointed in.) Other Assessments: Hip alignment: R inominate anterioly tilted, L  inominate posteriorly tiled (Corrected with muscle energy technique)  Sensation/Coordination/Flexibility/Functional Tests Sensation Light Touch: Appears Intact Coordination Gross Motor Movements are Fluid and Coordinated: Yes Fine Motor Movements are Fluid and Coordinated: Yes  Assessment RLE AROM (degrees) Right Hip Extension:  (5 degrees from neutral) Right Hip Flexion: 90 Right Hip ABduction: 30 Right Knee Extension: 0 Right Knee Flexion: 130 RLE Strength Right Hip Flexion: 4/5 Right Hip Extension: 2/5 Right Hip ABduction: 3+/5 Right Knee Extension: 5/5 LLE AROM (degrees) Left Hip Extension: 0 Left Hip Flexion: 90 Left Hip ABduction: 30 Left Knee Extension: 0 Left Knee Flexion: 130 LLE Strength Left Hip Flexion: 4/5 Left Hip Extension: 2+/5 Left Hip ABduction: 3+/5 Left Knee Extension: 5/5 Lumbar AROM Overall Lumbar AROM: Deficits Lumbar Flexion: 50% (pain) Lumbar Extension: 50% (painful) Lumbar - Right Side Bend: WFL Lumbar - Left Side Bend: WFL Lumbar - Right Rotation: 40% (pain) Lumbar - Left Rotation: 60% (pain)  Exercise/Treatments Mobility/Balance  Posture/Postural Control Posture/Postural Control: Postural limitations Postural Limitations: decreased lumbar lordosis, forward rounded shoulders.  Supine Bent Knee Raise: 10 reps;2 seconds Bridge: 10 reps (Left foot forward)  Physical Therapy Assessment and Plan PT Assessment and Plan Clinical Impression Statement: Patient presents to therapy with low back pain that radiates into posterior hips. Patient's low back pain attributed to a multitude of factors: Patient's hips were misaligned with R inominate being anteriorly tilted and left inominate being posteriorly tilted resulting in L LE being longer than R, following muscle energy techinige  and bridiging with L foot forward right foot back  patient's hip alignment was corrected and length descrepancy no longer present. Patient's low back pain also attributed  to limited hip mobility (hip extension, internal rotation and frontal plane mobility) resulting in increased strain and ROM requirements on lumbar spine for required mobility with walking. patient's low back pain also attributed to poor abdominal activation resulting in decreased trunk stability  as indicated by bilateral positive SLR test and L3,L4,L5 hypermobility. Patient's posture of decreased lumbar lordosis,  Patient will benefit from skilled physical therapy to address above listed limitations and return to prior level of function.  Pt will benefit from skilled therapeutic intervention in order to improve on the following deficits: Abnormal gait;Decreased activity tolerance;Decreased mobility;Decreased range of motion;Decreased strength;Difficulty walking Rehab Potential: Good PT Frequency: Min 2X/week PT Duration: 8 weeks PT Treatment/Interventions: Gait training;Manual techniques;Functional mobility training;Therapeutic exercise;Therapeutic activities;Neuromuscular re-education;Modalities;Stair training PT Plan: Reassess hip alignment and correct as needed. Increase hip IR, hip Ext, and lumbar rotation ROM, Improve abdominal bracing with supine and standing exercise. Decrease forward rounded shoulder and throacic kyphosis with UE stretches and strengthening activities. Overall initial focus on increasing hip mobility and strength along with improving abdominal bracing  thrrough painfree ROM.    Goals Home Exercise Program Pt/caregiver will Perform Home Exercise Program: For increased strengthening;Independently;For increased ROM PT Goal: Perform Home Exercise Program - Progress: Goal set today PT Short Term Goals Time to Complete Short Term Goals: 4 weeks PT Short Term Goal 1: Patient will be independent with correct performance of HEP PT Short Term Goal 2: Patient will be able to sit for 60min without complain of pain.  PT Short Term Goal 3: Patient will  be able to bend forward to pick up  trash off floor with pain <2/10  PT Short Term Goal 4: Patient's hip extension will improve to 10 degrees bilaterally to improved stride length.  PT Short Term Goal 5: patient will able to perform straight leg raise for 10 seconds with abdominal bracing indicating improved core control to improve spine and hip stability during gait and standing activities PT Long Term Goals Time to Complete Long Term Goals: 8 weeks PT Long Term Goal 1: Patient will be able to bend forward to pick up trash off floor with no pain and perform repetitive trunk flexion and extension through full ROM so she may work in her garden PT Long Term Goal 2: Patient will be able to sit >2 hours without pain to be able to enjoy watching a movie Long Term Goal 3: Patient will be able to sleep without waking due to pain <2 x per night to improve sleep quality Long Term Goal 4: patient will be able to walk with equal stride length, with hip extension ROM 15 degrees  so she may quickly ambulate in grocery store.  PT Long Term Goal 5: Patient will be able to tolerate full spinal rotaton ROM without pain  Problem List Patient Active Problem List   Diagnosis Date Noted  . Low back pain 02/22/2014  . Rectal bleeding 07/07/2013  . Hemorrhoid 07/07/2013  . Paroxysmal SVT (supraventricular tachycardia) 05/11/2013  . Drug-induced bradycardia 05/11/2013  . Pacemaker 05/11/2013  . Vomiting 06/03/2011  . Abdominal pain, acute, generalized 06/03/2011  . ANXIETY 09/18/2008  . HEMORRHOIDS, INTERNAL 09/18/2008  . IRRITABLE BOWEL SYNDROME 09/18/2008  . RECTAL BLEEDING 09/18/2008  . OSTEOPOROSIS 09/18/2008  . ARTERIOVENOUS MALFORMATION 09/18/2008  . WEIGHT LOSS 09/18/2008  . Diarrhea 09/18/2008   PT - End of Session Activity Tolerance: Patient tolerated treatment well PT Plan of Care PT Home Exercise Plan: Bridging and Bent knee raises in hook laying  PT Patient Instructions: perform twice daily Consulted and Agree with Plan of  Care: Patient  GP    Leia Alf DPT 02/22/2014, 3:10 PM  Physician Documentation Your signature is required to indicate approval of the treatment plan as stated above.  Please sign and either send electronically or make a copy of this report for your files and return this physician signed original.   Please mark one 1.__approve of plan  2. ___approve of plan with the following conditions.   ______________________________                                                          _____________________ Physician Signature                                                                                                             Date

## 2014-02-24 ENCOUNTER — Ambulatory Visit (HOSPITAL_COMMUNITY)
Admission: RE | Admit: 2014-02-24 | Discharge: 2014-02-24 | Disposition: A | Discharge status: Home / Self Care | Payer: BC Managed Care – PPO | Source: Ambulatory Visit | Attending: Neurosurgery | Admitting: Neurosurgery

## 2014-02-24 DIAGNOSIS — M545 Low back pain, unspecified: Secondary | ICD-10-CM

## 2014-02-24 NOTE — Progress Notes (Signed)
Physical Therapy Treatment Patient Details  Name: [name] MRN: [id number] Date of Birth: 08/08/1951  Today's Date: 02/24/2014 Time: 1100-1150 PT Time Calculation (min): 50 min Charges: Manual 1100-1120, There ex 1121-1150  Visit#: 2 of 16  Re-eval: 03/24/14   Authorization: [employer] shield  Authorization Time Period:    Authorization Visit#: 2 of 16   Subjective: Symptoms/Limitations Symptoms: Patient notes continued back pain. States HEP is going well and has no questions on how to perform exercises Pertinent History: Patient is a 63 y/o Female who present to therapy with a long history of back pain (>3 years), with cuurent episdoe beginning 12/25/13, Patient recieved a cortizone injection 02/09/14 at a hospital in [place] since when she has noticed decreased LE pain but continued, though improved back pain.  Limitations: Sitting How long can you sit comfortably?: <47minutes Pain Assessment Pain Score: 0-No pain (during session, continued 5/10 pain at home)  Exercise/Treatments 3D hip excursion, 4x15sec Hip flexor stretch 4x15sec Groin stretch, 2 way 4x15sec Seated piriformis stretch 4x15sec Big reach walk 77ft Forward lunge 5x Lateral lunge 5x  Manual Therapy Manual Therapy: Joint mobilization Joint Mobilization: manual stretching of piriformis and iliopsoas with muscle energyu technique, hip ER,IR, extension mobilization/   Physical Therapy Assessment and Plan PT Assessment and Plan Clinical Impression Statement: patient conitnued to have low back pain that radiates into posterior spine; today patient clarrified with location pain being near SI joint; pait has history of radiating pain though none today. Patient's hips maintained good alignment after correction last session. Patient contuinues to display limited piriformis mobility and limited hip joint IR,ER and extension all of which improved following grade 3 and 4 joint mobs. Patient noted improved symptoms  with walking following manual therapy.  Patient demsonstrated improving ability to actively contract abdominal muscles for bracing with activity though she still requires verba;/tactile cuing. Patient continues to have limited piriformis mobility resulting in increased toe out with gait. Followign therapy patient noted her walking felt better displays increased hip abduction prior to heel off, increased arm swing, increased hip extesnion and decreased toe out.  Rehab Potential: Good PT Frequency: Min 2X/week PT Duration: 8 weeks PT Plan: Reassess hip alignment and correct as needed. Increase hip IR, hip Ext, and lumbar rotation ROM, Improve abdominal bracing with supine and standing exercise. Decrease forward rounded shoulder and throacic kyphosis with UE stretches and strengthening activities. Overall initial focus on increasing hip mobility and strength along with improving abdominal bracing  thrrough painfree ROM. Assess SI joint for pain referral     Goals    Problem List Patient Active Problem List   Diagnosis Date Noted  . Low back pain 02/22/2014  . Rectal bleeding 07/07/2013  . Hemorrhoid 07/07/2013  . Paroxysmal SVT (supraventricular tachycardia) 05/11/2013  . Drug-induced bradycardia 05/11/2013  . Pacemaker 05/11/2013  . Vomiting 06/03/2011  . Abdominal pain, acute, generalized 06/03/2011  . ANXIETY 09/18/2008  . HEMORRHOIDS, INTERNAL 09/18/2008  . IRRITABLE BOWEL SYNDROME 09/18/2008  . RECTAL BLEEDING 09/18/2008  . OSTEOPOROSIS 09/18/2008  . ARTERIOVENOUS MALFORMATION 09/18/2008  . WEIGHT LOSS 09/18/2008  . Diarrhea 09/18/2008    PT - End of Session Activity Tolerance: Patient tolerated treatment well PT Plan of Care PT Home Exercise Plan: in addition to current HEP of Bridging and Bent knee raises in hook laying, added hip flexor stretch, seated piriformis stretch, forward and lateral lunging.  PT Patient Instructions: perform twice daily Consulted and Agree with Plan  of Care: Patient  GP Functional  Assessment Tool Used: FOTO: 37% impaired physical funtional limitation  Kadin Bera R DPT 02/24/2014, 1:04 PM

## 2014-03-01 ENCOUNTER — Ambulatory Visit (HOSPITAL_COMMUNITY)
Admission: RE | Admit: 2014-03-01 | Discharge: 2014-03-01 | Disposition: A | Discharge status: Home / Self Care | Payer: BC Managed Care – PPO | Source: Ambulatory Visit | Attending: Neurosurgery | Admitting: Neurosurgery

## 2014-03-01 DIAGNOSIS — M545 Low back pain, unspecified: Secondary | ICD-10-CM

## 2014-03-01 NOTE — Progress Notes (Signed)
Physical Therapy Treatment Patient Details  Name: [name] MRN: [id number] Date of Birth: 1951/11/19  Today's Date: 03/01/2014 Time: 1102-1148 PT Time Calculation (min): 46 min Charge Manual [id number], TE [id number]  Visit#: 3 of 16  Re-eval: 03/24/14 Assessment Diagnosis: low back pain secondary to hip malalignment and limited abdominal bracing resulting in difficulty with prolonged sitting.  Next MD Visit: 03/01/2014  Authorization: [employer] cross blue shield  Authorization Time Period:    Authorization Visit#: 3 of 16   Subjective: Symptoms/Limitations Symptoms: Pt reported LBP scale is the same.  Stated compliance wtih HEP daily. Pain Assessment Currently in Pain?: Yes Pain Score: 4  Pain Location: Back Pain Orientation: Lower  Objective:   Exercise/Treatments Stretches Piriformis Stretch: 3 reps;30 seconds;Limitations (figure 4) Aerobic Tread Mill: Gait training x 5 min @ 1.5 mph incline 3, therapist facilitation to improve Lt LE rotation Seated Other Seated Lumbar Exercises: PFC with verbal and tactile cueing 5x 10" Other Seated Lumbar Exercises: Diaphragmatic breathing x 8 minutes focusing on deep breathing with multimodal cueing Supine Bridge: 10 reps  Manual Therapy Manual Therapy: Other (comment) Other Manual Therapy: Muscle energy technique for Rt anterior rotation f/b gait training on TM; Manual stretches to piriformis and hamstring.  Physical Therapy Assessment and Plan PT Assessment and Plan Clinical Impression Statement: Muscle energy technique complete for Rt SI anterior rotation f/b gait training to set SI alignment.  Pt educated on importance of core strenghtening to keep alignment.  Diaphragmatic breathing instructed to improve oxygenation for maximal benefits.  Pt stated pain reduced with increased soreness.  MD apt scheduled for later today.   PT Plan: F/U with MD.  Reassess hip alignment and correct as needed. Increase hip IR, hip Ext, and lumbar  rotation ROM, Improve abdominal bracing with supine and standing exercise. Decrease forward rounded shoulder and throacic kyphosis with UE stretches and strengthening activities. Overall initial focus on increasing hip mobility and strength along with improving abdominal bracing  thrrough painfree ROM. Assess SI joint for pain referral     Goals Home Exercise Program Pt/caregiver will Perform Home Exercise Program: For increased strengthening;Independently;For increased ROM PT Short Term Goals Time to Complete Short Term Goals: 4 weeks PT Short Term Goal 1: Patient will be independent with correct performance of HEP PT Short Term Goal 1 - Progress: Progressing toward goal PT Short Term Goal 2: Patient will be able to sit for 87min without complain of pain.  PT Short Term Goal 3: Patient will be able to bend forward to pick up trash off floor with pain <2/10  PT Short Term Goal 4: Patient's hip extension will improve to 10 degrees bilaterally to improved stride length.  PT Short Term Goal 4 - Progress: Progressing toward goal PT Short Term Goal 5: patient will able to perform straight leg raise for 10 seconds with abdominal bracing indicating improved core control to improve spine and hip stability during gait and standing activities PT Short Term Goal 5 - Progress: Progressing toward goal PT Long Term Goals Time to Complete Long Term Goals: 8 weeks PT Long Term Goal 1: Patient will be able to bend forward to pick up trash off floor with no pain and perform repetitive trunk flexion and extension through full ROM so she may work in her garden PT Long Term Goal 2: Patient will be able to sit >2 hours without pain to be able to enjoy watching a movie Long Term Goal 3: Patient will be able to sleep without waking due  to pain <2 x per night to improve sleep quality Long Term Goal 4: patient will be able to walk with equal stride length, with hip extension ROM 15 degrees  so she may quickly ambulate in  grocery store.  PT Long Term Goal 5: Patient will be able to tolerate full spinal rotaton ROM without pain  Problem List Patient Active Problem List   Diagnosis Date Noted  . Low back pain 02/22/2014  . Rectal bleeding 07/07/2013  . Hemorrhoid 07/07/2013  . Paroxysmal SVT (supraventricular tachycardia) 05/11/2013  . Drug-induced bradycardia 05/11/2013  . Pacemaker 05/11/2013  . Vomiting 06/03/2011  . Abdominal pain, acute, generalized 06/03/2011  . ANXIETY 09/18/2008  . HEMORRHOIDS, INTERNAL 09/18/2008  . IRRITABLE BOWEL SYNDROME 09/18/2008  . RECTAL BLEEDING 09/18/2008  . OSTEOPOROSIS 09/18/2008  . ARTERIOVENOUS MALFORMATION 09/18/2008  . WEIGHT LOSS 09/18/2008  . Diarrhea 09/18/2008    PT - End of Session Activity Tolerance: Patient tolerated treatment well General Behavior During Therapy: [facility] for tasks assessed/performed  GP    Aldona Lento 03/01/2014, 12:24 PM

## 2014-03-03 ENCOUNTER — Ambulatory Visit (HOSPITAL_COMMUNITY)
Admission: RE | Admit: 2014-03-03 | Discharge: 2014-03-03 | Disposition: A | Discharge status: Home / Self Care | Payer: BC Managed Care – PPO | Source: Ambulatory Visit | Attending: Neurosurgery | Admitting: Neurosurgery

## 2014-03-03 DIAGNOSIS — M545 Low back pain, unspecified: Secondary | ICD-10-CM

## 2014-03-03 NOTE — Progress Notes (Signed)
Physical Therapy Treatment Patient Details  Name: [name] MRN: [id number] Date of Birth: Mar 17, 1951  Today's Date: 03/03/2014 Time: 1102-1148 PT Time Calculation (min): 46 min Manual  X 28 min  There ex x 18 min    Visit#: 4 of 16  Re-eval: 03/24/14    Authorization: blue cross blue shield  Authorization Time Period:    Authorization Visit#: 4 of 16   Subjective: Symptoms/Limitations Symptoms: reports  legs are sore, denies pain   Precautions/Restrictions     Exercise/Treatments Mobility/Balance        Stretches Hip Flexor Stretch: 4 reps;10 seconds  Using stair  ITB Stretch: 2 reps;30 seconds;Limitations ITB Stretch Limitations: standing  Piriformis Stretch: 2 reps;30 seconds;Limitations Piriformis Stretch Limitations: seated  Standing Forward Lunge: 10 reps Other Standing Lumbar Exercises: 3 D standing hip excursion  Seated   Supine Bent Knee Raise: 10 reps;2 seconds Bridge: 10 reps  Manual Therapy Joint Mobilization: PROM B hip IR/ ER, left hip distractions  Other Manual Therapy: muscle energy for Rt anterior rotated innomate , manual stretch to piriformis and gluteals , IT band  Physical Therapy Assessment and Plan PT Assessment and Plan Clinical Impression Statement: pateint reports soreness in legs post prior treatment, patient requires further skilled rehab to attain improved SI alignment and improve functional abilites related to sittng and walking tolerance. She has notable decrease spinal AROM into flexion. good progression through hip and LE stretches this visit, hip ROM improving  PT Plan: F/U with MD.  Reassess hip alignment and correct as needed. Increase hip IR, hip Ext, and lumbar rotation ROM, Improve abdominal bracing with supine and standing exercise. Decrease forward rounded shoulder and throacic kyphosis with UE stretches and strengthening activities. Overall initial focus on increasing hip mobility and strength along with improving  abdominal bracing  thrrough painfree ROM. Assess SI joint for pain referral     Goals PT Short Term Goals Time to Complete Short Term Goals: 4 weeks PT Short Term Goal 1: Patient will be independent with correct performance of HEP PT Short Term Goal 1 - Progress: Progressing toward goal PT Short Term Goal 2: Patient will be able to sit for 64min without complain of pain.  PT Short Term Goal 2 - Progress: Progressing toward goal PT Short Term Goal 3: Patient will be able to bend forward to pick up trash off floor with pain <2/10  PT Short Term Goal 3 - Progress: Progressing toward goal PT Short Term Goal 4: Patient's hip extension will improve to 10 degrees bilaterally to improved stride length.  PT Short Term Goal 4 - Progress: Progressing toward goal PT Short Term Goal 5: patient will able to perform straight leg raise for 10 seconds with abdominal bracing indicating improved core control to improve spine and hip stability during gait and standing activities PT Short Term Goal 5 - Progress: Progressing toward goal PT Long Term Goals Time to Complete Long Term Goals: 8 weeks PT Long Term Goal 1: Patient will be able to bend forward to pick up trash off floor with no pain and perform repetitive trunk flexion and extension through full ROM so she may work in her garden PT Long Term Goal 1 - Progress: Progressing toward goal PT Long Term Goal 2: Patient will be able to sit >2 hours without pain to be able to enjoy watching a movie Long Term Goal 3: Patient will be able to sleep without waking due to pain <2 x per night to improve sleep quality  Long Term Goal 3 Progress: Progressing toward goal Long Term Goal 4: patient will be able to walk with equal stride length, with hip extension ROM 15 degrees  so she may quickly ambulate in grocery store.  Long Term Goal 4 Progress: Progressing toward goal PT Long Term Goal 5: Patient will be able to tolerate full spinal rotaton ROM without pain Long Term  Goal 5 Progress: Progressing toward goal  Problem List Patient Active Problem List   Diagnosis Date Noted  . Low back pain 02/22/2014  . Rectal bleeding 07/07/2013  . Hemorrhoid 07/07/2013  . Paroxysmal SVT (supraventricular tachycardia) 05/11/2013  . Drug-induced bradycardia 05/11/2013  . Pacemaker 05/11/2013  . Vomiting 06/03/2011  . Abdominal pain, acute, generalized 06/03/2011  . ANXIETY 09/18/2008  . HEMORRHOIDS, INTERNAL 09/18/2008  . IRRITABLE BOWEL SYNDROME 09/18/2008  . RECTAL BLEEDING 09/18/2008  . OSTEOPOROSIS 09/18/2008  . ARTERIOVENOUS MALFORMATION 09/18/2008  . WEIGHT LOSS 09/18/2008  . Diarrhea 09/18/2008    PT - End of Session Activity Tolerance: Patient tolerated treatment well  GP    Michaelina Blandino 03/03/2014, 11:55 AM

## 2014-03-08 ENCOUNTER — Ambulatory Visit (HOSPITAL_COMMUNITY)
Admission: RE | Admit: 2014-03-08 | Discharge: 2014-03-08 | Disposition: A | Discharge status: Home / Self Care | Payer: BC Managed Care – PPO | Source: Ambulatory Visit | Attending: Internal Medicine | Admitting: Internal Medicine

## 2014-03-08 NOTE — Progress Notes (Signed)
Physical Therapy Treatment Patient Details  Name: [name] MRN: [id number] Date of Birth: May 23, 1951  Today's Date: 03/08/2014 Time: 4034-7425 PT Time Calculation (min): 44 min Charges: Therapeutic exercise 20min, manual Therapy 34min  Visit#: 5 of 16  Re-eval: 03/24/14 Assessment Diagnosis: low back pain secondary to hip malalignment and limited abdominal bracing resulting in difficulty with prolonged sitting.  Next MD Visit: 03/01/2014  Authorization: [employer]  Authorization Time Period:    Authorization Visit#: 5 of 16   Subjective: Symptoms/Limitations Symptoms: Patient planted in garden yesterday and noted some ching and pain, but was able to tolerate the activity. Paient states that she believes she may have over done it as her back is tighened up today Pertinent History: Patient is a 63 y/o Female who present to therapy with a long history of back pain (>3 years), with cuurent episdoe beginning 12/25/13, Patient recieved a cortizone injection 02/09/14 at a hospital in [place] since when she has noticed decreased LE pain but continued, though improved back pain.  Limitations: Sitting How long can you sit comfortably?: <54minutes How long can you stand comfortably?: WFL How long can you walk comfortably?: WFL Patient Stated Goals:  to be able to sleep through night with waking less than 1 time, and and to be able to do normal household ADL without pain Pain Assessment Currently in Pain?: Yes Pain Score: 3   Exercise/Treatments Stretches Active Hamstring Stretch:  (10x 3seconds with rope, 3way) Piriformis Stretch: 2 reps;30 seconds;Limitations Standing Forward Lunge: 10 reps Other Standing Lumbar Exercises: 3 D standing hip excursion  Supine Bridge: 10 reps Straight Leg Raise: 10 reps;3 seconds (hooklying)  Manual Therapy Manual Therapy: Myofascial release Myofascial Release: quadratus lumborum, multifidis, and lumbar paraspinals.  Other Manual Therapy:  Manual Lumbar distarction.   Physical Therapy Assessment and Plan PT Assessment and Plan Clinical Impression Statement: Patient reports pain and soreness in back along with feeling os increased tightness following working in her garden all day yesterday though she states that she was happy to be able to tolerate the increased activtiy. Patient demosntrates improvign hip mobility with improved gain and increased ability to reach towards toes. Patient states that next session will be her last until she sees he doctor in early April . Patien SI alignment continues to demonstrate good stabilit patient tolerated progressed strengthening exercises well with no increase in smptoms follwoing myofascial release to lumbar paraspinals and  manual lumbar traction.  Patient demosntrated and stated understanding of education regard proper lifting and working techiques  to help patient work in her garden and place less stress on her back.  Pt will benefit from skilled therapeutic intervention in order to improve on the following deficits: Abnormal gait;Decreased activity tolerance;Decreased mobility;Decreased range of motion;Decreased strength;Difficulty walking PT Plan:  Reassess hip alignment and correct as needed. Increase hip IR, hip Ext, and lumbar rotation ROM. Progress abdominal bracing with supine and standing exercises. Initial activites to decrease forward rounded shoulder and throacic kyphosis with UE stretches and strengthening activities (specifically 3D UE pendulum activtiy).    Goals Home Exercise Program Pt/caregiver will Perform Home Exercise Program: For increased strengthening;Independently;For increased ROM PT Goal: Perform Home Exercise Program - Progress: Progressing toward goal PT Short Term Goals Time to Complete Short Term Goals: 4 weeks PT Short Term Goal 1: Patient will be independent with correct performance of HEP PT Short Term Goal 1 - Progress: Progressing toward goal PT Short Term  Goal 2: Patient will be able to sit for  16min without complain of pain.  PT Short Term Goal 2 - Progress: Progressing toward goal PT Short Term Goal 3: Patient will be able to bend forward to pick up trash off floor with pain <2/10  PT Short Term Goal 3 - Progress: Progressing toward goal PT Short Term Goal 4: Patient's hip extension will improve to 10 degrees bilaterally to improved stride length.  PT Short Term Goal 4 - Progress: Progressing toward goal PT Short Term Goal 5: patient will able to perform straight leg raise for 10 seconds with abdominal bracing indicating improved core control to improve spine and hip stability during gait and standing activities PT Short Term Goal 5 - Progress: Progressing toward goal PT Long Term Goals Time to Complete Long Term Goals: 8 weeks PT Long Term Goal 1: Patient will be able to bend forward to pick up trash off floor with no pain and perform repetitive trunk flexion and extension through full ROM so she may work in her garden PT Long Term Goal 2: Patient will be able to sit >2 hours without pain to be able to enjoy watching a movie Long Term Goal 3: Patient will be able to sleep without waking due to pain <2 x per night to improve sleep quality Long Term Goal 4: patient will be able to walk with equal stride length, with hip extension ROM 15 degrees  so she may quickly ambulate in grocery store.  PT Long Term Goal 5: Patient will be able to tolerate full spinal rotaton ROM without pain  Problem List Patient Active Problem List   Diagnosis Date Noted  . Low back pain 02/22/2014  . Rectal bleeding 07/07/2013  . Hemorrhoid 07/07/2013  . Paroxysmal SVT (supraventricular tachycardia) 05/11/2013  . Drug-induced bradycardia 05/11/2013  . Pacemaker 05/11/2013  . Vomiting 06/03/2011  . Abdominal pain, acute, generalized 06/03/2011  . ANXIETY 09/18/2008  . HEMORRHOIDS, INTERNAL 09/18/2008  . IRRITABLE BOWEL SYNDROME 09/18/2008  . RECTAL BLEEDING  09/18/2008  . OSTEOPOROSIS 09/18/2008  . ARTERIOVENOUS MALFORMATION 09/18/2008  . WEIGHT LOSS 09/18/2008  . Diarrhea 09/18/2008    PT - End of Session Activity Tolerance: Patient tolerated treatment well General Behavior During Therapy: WFL for tasks assessed/performed PT Plan of Care PT Home Exercise Plan: in addition to current HEP of Bridging and Bent knee raises in hook laying, continued hip flexor stretch, seated piriformis stretch, forward and lateral lunging.  PT Patient Instructions: perform twice daily Consulted and Agree with Plan of Care: Patient  GP Functional Assessment Tool Used: FOTO: 37% impaired physical funtional limitation  Darrin Apodaca R PT DPT 03/08/2014, 11:58 AM

## 2014-03-10 ENCOUNTER — Inpatient Hospital Stay (HOSPITAL_COMMUNITY)
Admission: RE | Admit: 2014-03-10 | Payer: BC Managed Care – PPO | Source: Ambulatory Visit | Admitting: Physical Therapy

## 2014-03-14 ENCOUNTER — Ambulatory Visit (HOSPITAL_COMMUNITY)
Admission: RE | Admit: 2014-03-14 | Discharge: 2014-03-14 | Disposition: A | Discharge status: Home / Self Care | Payer: BC Managed Care – PPO | Source: Ambulatory Visit | Attending: Internal Medicine | Admitting: Internal Medicine

## 2014-03-14 DIAGNOSIS — M545 Low back pain, unspecified: Secondary | ICD-10-CM

## 2014-03-14 NOTE — Progress Notes (Addendum)
Physical Therapy Treatment Patient Details  Name: [name] MRN: [id number] Date of Birth: November 07, 1951  Today's Date: 03/14/2014 Time: 1300-1355 PT Time Calculation (min): 55 min Charges: manual [id number], Therapeutic Exercise [id number]  Visit#: 6 of 16  Re-eval: 03/24/14 Assessment Diagnosis: low back pain secondary to hip malalignment and limited abdominal bracing resulting in difficulty with prolonged sitting.  Next MD Visit: 03/01/2014  Authorization: [employer]  Authorization Visit#: 6 of 16   Subjective: Symptoms/Limitations Symptoms: Patient states she has been working in her garden and has minor pain and soreness in her low back but over all feels much better. Pertinent History: Patient is a 63 y/o Female who present to therapy with a long history of back pain (>3 years), with cuurent episdoe beginning 12/25/13, Patient recieved a cortizone injection 02/09/14 at a hospital in [place] since when she has noticed decreased LE pain but continued, though improved back pain.  Limitations: Sitting How long can you sit comfortably?: 10mn though she believes it has improved.  How long can you stand comfortably?: WFL How long can you walk comfortably?: WFL Patient Stated Goals:  to be able to sleep through night with waking less than 1 time, and and to be able to do normal household ADL without pain Pain Assessment Currently in Pain?: Yes Pain Score: 3  Pain Location: Back Pain Onset: More than a month ago  Exercise/Treatments Stretches Active Hamstring Stretch:  (10x 3seconds with rope, 3way) Quad Stretch:  (10x 3seconds) Piriformis Stretch: 2 reps;30 seconds;Limitations Standing Forward Lunge: 10 reps Side Lunge: 10 reps Other Standing Lumbar Exercises: 3 D standing hip excursion  Supine Bent Knee Raise: 10 reps;2 seconds Bridge: 10 reps Straight Leg Raise: 10 reps;3 seconds (hooklying) Quadruped Single Arm Raises Limitations: 10x with each arm Other  Treatement Myofascial release of Lumbar paraspinal muscles, and therapist provided manual lumbar distraction.   Physical Therapy Assessment and Plan PT Assessment and Plan Clinical Impression Statement: Patient reports pain and soreness in back along with feeling of increased tightness following working in her gardening over the last several days though she states that she was happy to be able to tolerate the increased activity and believes she has been able to do more activity each week with less pain. Patient demonstrates improving hip mobility with improved gait and increased ability to reach towards toes. Patient states that today's session will be her last until she sees her doctor in early April due to her busy schedule though she will try to schedule a session next week if time becomes available. Patient SI alignment continues to demonstrate good stability and patient tolerated progressed strengthening exercises well with no increase in symptoms following myofascial release to lumbar paraspinals and manual lumbar traction. Patient demonstrated good understanding of HEP and proper body mechanics with lifting with no cues required.  Pt will benefit from skilled therapeutic intervention in order to improve on the following deficits: Abnormal gait;Decreased activity tolerance;Decreased mobility;Decreased range of motion;Decreased strength;Difficulty walking Rehab Potential: Good PT Frequency: Min 2X/week PT Duration: 8 weeks PT Treatment/Interventions: Gait training;Manual techniques;Functional mobility training;Therapeutic exercise;Therapeutic activities;Neuromuscular re-education;Modalities;Stair training PT Plan: Increase hip IR, hip Ext, and lumbar rotation ROM. Progress abdominal bracing with supine and standing exercisesContinue progresion of Thoracic spine mobility and begin introduscing Thoracic stabilization exercises through fuill ranges of motion. Progress squat depth and stride length with  lunging as able..     Goals Home Exercise Program Pt/caregiver will Perform Home Exercise Program: For increased strengthening;Independently;For increased ROM PT Goal:  Perform Home Exercise Program - Progress: Met PT Short Term Goals Time to Complete Short Term Goals: 4 weeks PT Short Term Goal 1: Patient will be independent with correct performance of HEP PT Short Term Goal 1 - Progress: Met PT Short Term Goal 2 - Progress: Progressing toward goal PT Short Term Goal 3: Patient will be able to bend forward to pick up trash off floor with pain <2/10  PT Short Term Goal 3 - Progress: Met PT Short Term Goal 4: Patient's hip extension will improve to 10 degrees bilaterally to improved stride length.  PT Short Term Goal 4 - Progress: Progressing toward goal PT Short Term Goal 5: patient will able to perform straight leg raise for 10 seconds with abdominal bracing indicating improved core control to improve spine and hip stability during gait and standing activities PT Short Term Goal 5 - Progress: Met PT Long Term Goals Time to Complete Long Term Goals: 8 weeks PT Long Term Goal 1: Patient will be able to bend forward to pick up trash off floor with no pain and perform repetitive trunk flexion and extension through full ROM so she may work in her garden PT Long Term Goal 1 - Progress: Met PT Long Term Goal 2: Patient will be able to sit >2 hours without pain to be able to enjoy watching a movie PT Long Term Goal 2 - Progress: Progressing toward goal Long Term Goal 3: Patient will be able to sleep without waking due to pain <2 x per night to improve sleep quality Long Term Goal 3 Progress: Progressing toward goal Long Term Goal 4: patient will be able to walk with equal stride length, with hip extension ROM 15 degrees  so she may quickly ambulate in grocery store.  Long Term Goal 4 Progress: Progressing toward goal PT Long Term Goal 5: Patient will be able to tolerate full spinal rotaton ROM  without pain Long Term Goal 5 Progress: Progressing toward goal  Problem List Patient Active Problem List   Diagnosis Date Noted  . Low back pain 02/22/2014  . Rectal bleeding 07/07/2013  . Hemorrhoid 07/07/2013  . Paroxysmal SVT (supraventricular tachycardia) 05/11/2013  . Drug-induced bradycardia 05/11/2013  . Pacemaker 05/11/2013  . Vomiting 06/03/2011  . Abdominal pain, acute, generalized 06/03/2011  . ANXIETY 09/18/2008  . HEMORRHOIDS, INTERNAL 09/18/2008  . IRRITABLE BOWEL SYNDROME 09/18/2008  . RECTAL BLEEDING 09/18/2008  . OSTEOPOROSIS 09/18/2008  . ARTERIOVENOUS MALFORMATION 09/18/2008  . WEIGHT LOSS 09/18/2008  . Diarrhea 09/18/2008    PT - End of Session Activity Tolerance: Patient tolerated treatment well General Behavior During Therapy: [facility] for tasks assessed/performed PT Plan of Care PT Patient Instructions: perform twice daily Consulted and Agree with Plan of Care: Patient  GP    Leia Alf PT DPT 03/14/2014, 2:07 PM

## 2014-03-27 ENCOUNTER — Ambulatory Visit (INDEPENDENT_AMBULATORY_CARE_PROVIDER_SITE_OTHER): Payer: BC Managed Care – PPO

## 2014-03-27 DIAGNOSIS — I471 Supraventricular tachycardia, unspecified: Secondary | ICD-10-CM

## 2014-03-27 DIAGNOSIS — I498 Other specified cardiac arrhythmias: Secondary | ICD-10-CM

## 2014-03-27 DIAGNOSIS — T50905A Adverse effect of unspecified drugs, medicaments and biological substances, initial encounter: Principal | ICD-10-CM

## 2014-03-27 DIAGNOSIS — R001 Bradycardia, unspecified: Secondary | ICD-10-CM

## 2014-03-27 DIAGNOSIS — I10 Essential (primary) hypertension: Secondary | ICD-10-CM

## 2014-03-29 ENCOUNTER — Telehealth (HOSPITAL_COMMUNITY): Payer: Self-pay

## 2014-03-29 ENCOUNTER — Ambulatory Visit (HOSPITAL_COMMUNITY): Payer: BC Managed Care – PPO | Admitting: Physical Therapy

## 2014-03-30 LAB — MDC_IDC_ENUM_SESS_TYPE_REMOTE
Battery Remaining Longevity: 96 mo
Battery Voltage: 2.95 V
Brady Statistic AP VP Percent: 1 %
Brady Statistic AP VS Percent: 44 %
Brady Statistic AS VP Percent: 1 %
Brady Statistic AS VS Percent: 55 %
Brady Statistic RA Percent Paced: 45 %
Brady Statistic RV Percent Paced: 1 %
Date Time Interrogation Session: 20150413072640
Implantable Pulse Generator Model: 2210
Implantable Pulse Generator Serial Number: 7250678
Lead Channel Impedance Value: 450 Ohm
Lead Channel Impedance Value: 490 Ohm
Lead Channel Pacing Threshold Amplitude: 0.625 V
Lead Channel Pacing Threshold Amplitude: 0.875 V
Lead Channel Pacing Threshold Pulse Width: 0.5 ms
Lead Channel Pacing Threshold Pulse Width: 0.5 ms
Lead Channel Sensing Intrinsic Amplitude: 2.2 mV
Lead Channel Sensing Intrinsic Amplitude: 2.6 mV
Lead Channel Setting Pacing Amplitude: 1.125
Lead Channel Setting Pacing Amplitude: 1.625
Lead Channel Setting Pacing Pulse Width: 0.5 ms
Lead Channel Setting Sensing Sensitivity: 2 mV

## 2014-04-04 ENCOUNTER — Ambulatory Visit (HOSPITAL_COMMUNITY)
Admission: RE | Admit: 2014-04-04 | Discharge: 2014-04-04 | Disposition: A | Discharge status: Home / Self Care | Payer: BC Managed Care – PPO | Source: Ambulatory Visit | Attending: Neurosurgery | Admitting: Neurosurgery

## 2014-04-04 DIAGNOSIS — M545 Low back pain, unspecified: Secondary | ICD-10-CM | POA: Insufficient documentation

## 2014-04-04 DIAGNOSIS — R262 Difficulty in walking, not elsewhere classified: Secondary | ICD-10-CM | POA: Insufficient documentation

## 2014-04-04 DIAGNOSIS — IMO0001 Reserved for inherently not codable concepts without codable children: Secondary | ICD-10-CM | POA: Insufficient documentation

## 2014-04-04 NOTE — Evaluation (Signed)
Physical Therapy Re-assessment  Patient Details  Name: [name] MRN: [id number] Date of Birth: 1951/10/02  Today's Date: 04/04/2014 Time: 1300-1345 PT Time Calculation (min): 45 min  Charges: Manual therapy 1315-1330 Therapeutic Exercise 1330-1345          Visit#: 7 of 16  Re-eval: 05/04/14 Assessment Diagnosis: low back pain secondary to hip malalignment and limited abdominal bracing resulting in difficulty with prolonged sitting.  Next MD Visit: Marcelline Mates end of May, 2015 Prior Therapy: no  Authorization: [employer] cross [employer] shield    Authorization Time Period:    Authorization Visit#: 7 of 16   Past Medical History:  Past Medical History  Diagnosis Date  . Osteoarthritis   . Anxiety   . Diverticulosis of colon   . Osteoporosis   . Tachy-brady syndrome   . Paroxysmal atrial fibrillation     with SVT response  . Sinus node dysfunction    Past Surgical History:  Past Surgical History  Procedure Laterality Date  . S/p hysterectomy    . Cholecystectomy    . Appendectomy    . Cervical spine surgery      two  . Vocal cord polypectomy    . Breast biopsy      right, benign  . Colonoscopy  2007    sigmoid diverticula, few ulcerations of terminal ileum, biopsies of TI unremarkable, random colon bx neg for microscopic colitis.  . Sb capsule study  2009    Two single small nonbleeding AVMs in the  proximal small bowel, single lymphangiectasia in mid small bowel.  . Permanent pacemaker insertion  07/2011  . Esophagogastroduodenoscopy  06/04/11    Dr. Vivi Ferns esophagus, deffuse petechial and gastric submucosal petechiae- benign mucousa on bx. Small bowel biopsy benign. No H. pylori.  . Colonoscopy  06/04/11    Dr. Gala Romney- pegmentation of the rectum, pancolonic diverticula, solitary ulcer in the mouth of the ileocecal valve, distal 15 cm of the terminal ileum appeared normal. Random colon biopsies were benign. Ileocecal valve ulcer or was benign without any supportive evidence  of inflammatory bowel disease.    Subjective Symptoms/Limitations Symptoms: Pt reported injection L5 on 03/28/14 with increased pain following 2 days.  Pt stated she felt really tight today. Pain Assessment Currently in Pain?: Yes Pain Score: 6  Pain Location: Back  Cognition/Observation Observation/Other Assessments Observations: Gait: equal stride length, minor increase in toe outo Rt foot during foot flat.  Other Assessments: Hip Alignment: WNL  Assessment RLE AROM (degrees) Right Hip Extension: 5 Right Hip Flexion: 130 RLE Strength Right Hip Flexion: 4/5 Right Hip Extension: 2+/5 Right Hip ABduction: 3+/5 LLE AROM (degrees) Left Hip Extension: 5 Left Hip Flexion: 130 LLE Strength Left Hip Flexion: 4/5 Left Hip Extension: 2+/5 Left Hip ABduction: 3+/5 Lumbar AROM Lumbar Flexion: 80% (from full AROm with end range pain. ) Lumbar Extension: 80% (from full ROM, no end range pain) Lumbar - Right Rotation: 60% (no pain) Lumbar - Left Rotation: 60% (no pain)  Exercise/Treatments Stretches Piriformis Stretch: Other (comment) (10x 3seconds 3 way seated) Standing Other Standing Lumbar Exercises: 2 way big reach walk 5f.  Supine Other Supine Lumbar Exercises: hooklaying hip rotation 10x  Physical Therapy Assessment and Plan PT Assessment and Plan Clinical Impression Statement: Patient displays an overall improvement in pain free hip and lumbar spine AROM with decreasing pain symptoms. patient conitnues to have difficulty with prolonged standing secondary to decreased lumbar and hip stablity and stiffness. Patient will continue to benefit from skilled physical therapy to  adresss hip mobility limitation and progress strengthenign of hip and trunk stabilizers.  Patient continues to demonstrate a good response to therapy this session with improved gait following hip mobility exercises noting that she feels better when she's walkign now. Patient will continue to benefit from  skilled PT to continue to progress to meeting all long term goals.  PT Plan: Continue current plan of care to increase hip IR, hip Ext, and lumbar rotation ROM. Progress abdominal bracing from supine to standing exercises and progress thoracic spine mobility and begin introducing Thoracic stabilization exercises through fuill ranges of motion. Progress squat depth and stride length with lunging as tolerated.     Goals PT Short Term Goals PT Short Term Goal 1: Patient will be independent with correct performance of HEP PT Short Term Goal 1 - Progress: Met PT Short Term Goal 2: Patient will be able to sit for 25mn without complain of pain.  PT Short Term Goal 2 - Progress: Progressing toward goal PT Short Term Goal 3: Patient will be able to bend forward to pick up trash off floor with pain <2/10  PT Short Term Goal 3 - Progress: Met PT Short Term Goal 4: Patient's hip extension will improve to 10 degrees bilaterally to improved stride length.  PT Short Term Goal 4 - Progress: Progressing toward goal PT Short Term Goal 5: patient will able to perform straight leg raise for 10 seconds with abdominal bracing indicating improved core control to improve spine and hip stability during gait and standing activities PT Short Term Goal 5 - Progress: Met PT Long Term Goals PT Long Term Goal 1: Patient will be able to bend forward to pick up trash off floor with no pain and perform repetitive trunk flexion and extension through full ROM so she may work in her garden PT Long Term Goal 1 - Progress: Met PT Long Term Goal 2: Patient will be able to sit >2 hours without pain to be able to enjoy watching a movie PT Long Term Goal 2 - Progress: Progressing toward goal Long Term Goal 3: Patient will be able to sleep without waking due to pain <2 x per night to improve sleep quality Long Term Goal 3 Progress: Progressing toward goal Long Term Goal 4: patient will be able to walk with equal stride length, with hip  extension ROM 15 degrees  so she may quickly ambulate in grocery store.  (Still limited b ROM, currently equal stride length) Long Term Goal 4 Progress: Progressing toward goal PT Long Term Goal 5: Patient will be able to tolerate full spinal rotaton ROM without pain (still limited by ROM, no pain.) Long Term Goal 5 Progress: Progressing toward goal  Problem List Patient Active Problem List   Diagnosis Date Noted  . Low back pain 02/22/2014  . Rectal bleeding 07/07/2013  . Hemorrhoid 07/07/2013  . Paroxysmal SVT (supraventricular tachycardia) 05/11/2013  . Drug-induced bradycardia 05/11/2013  . Pacemaker 05/11/2013  . Vomiting 06/03/2011  . Abdominal pain, acute, generalized 06/03/2011  . ANXIETY 09/18/2008  . HEMORRHOIDS, INTERNAL 09/18/2008  . IRRITABLE BOWEL SYNDROME 09/18/2008  . RECTAL BLEEDING 09/18/2008  . OSTEOPOROSIS 09/18/2008  . ARTERIOVENOUS MALFORMATION 09/18/2008  . WEIGHT LOSS 09/18/2008  . Diarrhea 09/18/2008    PT - End of Session Activity Tolerance: Patient tolerated treatment well General Behavior During Therapy: WFL for tasks assessed/performed PT Plan of Care PT Home Exercise Plan: in addition to current HEP of Bridging and Bent knee raises in hook laying, continued  hip flexor stretch, seated piriformis stretch, forward and lateral lunging. Introduced hooklaying rotation exercise to progress hip and lumbar rotation mobility PT Patient Instructions: perform twice daily Consulted and Agree with Plan of Care: Patient  GP    Suzette Battiest Zenon Leaf PT DPT 04/04/2014, 1:54 PM  Physician Documentation Your signature is required to indicate approval of the treatment plan as stated above.  Please sign and either send electronically or make a copy of this report for your files and return this physician signed original.   Please mark one 1.__approve of plan  2. ___approve of plan with the following conditions.   ______________________________                                                           _____________________ Physician Signature                                                                                                             Date

## 2014-04-07 ENCOUNTER — Encounter: Payer: Self-pay | Admitting: *Deleted

## 2014-04-11 ENCOUNTER — Ambulatory Visit (HOSPITAL_COMMUNITY): Payer: BC Managed Care – PPO

## 2014-04-18 ENCOUNTER — Ambulatory Visit (HOSPITAL_COMMUNITY)
Admission: RE | Admit: 2014-04-18 | Discharge: 2014-04-18 | Disposition: A | Discharge status: Home / Self Care | Payer: BC Managed Care – PPO | Source: Ambulatory Visit | Attending: Neurosurgery | Admitting: Neurosurgery

## 2014-04-18 DIAGNOSIS — M545 Low back pain, unspecified: Secondary | ICD-10-CM | POA: Insufficient documentation

## 2014-04-18 DIAGNOSIS — R262 Difficulty in walking, not elsewhere classified: Secondary | ICD-10-CM | POA: Insufficient documentation

## 2014-04-18 DIAGNOSIS — IMO0001 Reserved for inherently not codable concepts without codable children: Secondary | ICD-10-CM | POA: Insufficient documentation

## 2014-04-18 NOTE — Progress Notes (Signed)
Physical Therapy Treatment Patient Details  Name: CHANCEY CULLINANE MRN: [id number] Date of Birth: 1951/05/06  Today's Date: 04/18/2014 Time: 1105-1150 PT Time Calculation (min): 45 min`   Charges: Manual therapy: 1105-1130, TherEx 1130-1150 Visit#: 8 of 16  Re-eval: 05/04/14 Assessment Diagnosis: low back pain secondary to hip malalignment and limited abdominal bracing resulting in difficulty with prolonged sitting.  Next MD Visit: Marcelline Mates end of May, 2015 Prior Therapy: no  Authorization: blue cross blue shield  Authorization Visit#: 8 of 16   Subjective: Symptoms/Limitations Symptoms: Patient states that she is afraid to have another injection or back procedure. no pain through out session. Patient states that her ability to sit in a chair is still severly limited.  Pertinent History: Patient is a 63 y/o Female who present to therapy with a long history of back pain (>3 years), with cuurent episdoe beginning 12/25/13, Patient recieved a cortizone injection 02/09/14 at a hospital in [place] since when she has noticed decreased LE pain but continued, though improved back pain.  Limitations: Sitting How long can you sit comfortably?: 61min though she believes it has improved.  How long can you stand comfortably?: WFL How long can you walk comfortably?: WFL Patient Stated Goals:  to be able to sleep through night with waking less than 1 time, and and to be able to do normal household ADL without pain Pain Assessment Currently in Pain?: No/denies  Exercise/Treatments Stretches Active Hamstring Stretch:  (10x 3seconds with rope, 3way) Double Knee to Chest Stretch: 4 reps;20 seconds Hip Flexor Stretch: 4 reps;10 seconds Piriformis Stretch: Other (comment) (10x 3seconds 3 way seated) Standing Other Standing Lumbar Exercises: split stance 3D hip excursion 10x Supine Other Supine Lumbar Exercises: hooklaying hip rotation 10x  Manual Therapy Manual Therapy: Myofascial release Joint  Mobilization: PROM B hip IR/ ER, left hip distractions, piriformis stretch.  Myofascial Release: quadratus lumborum, multifidis, and lumbar paraspinals  Physical Therapy Assessment and Plan PT Assessment and Plan Clinical Impression Statement: Patient displays an overall improving hip and lumbar spine AROM with decreasing pain symptoms. Patient no longer has difficulty with prolonged standing but continued to have aching in back and op of posterior legs with prolonged sitting. Continue focus on improving hip mobility limitations and progress core strengthening of hip and trunk stabilizers. Patient continued demonstrating a good response to therapy this session with improved gait following hip mobility exercises noting that she feels better when she's walking now. Patient states that her MD wanted to cauterize the nerve to decrease the pain and burning in her back/hip/knee. Patient is afraid of this procedure. Patient has no pain with walking and states she feels good standing now too, patient notes that she continues to have pain and difficulty with prolonged sitting.  Seated posture assessed this session with patient demonstrating limited lumbar extension, during which she has increasing numbness and tingling in lumbar spine radiating into hips. Following seated extension exercises patient states decreased symptoms.  Patient to continue therapy through next 5 visits and then be reassessed by MD for possible nerve cauderization.  Pt will benefit from skilled therapeutic intervention in order to improve on the following deficits: Abnormal gait;Decreased activity tolerance;Decreased mobility;Decreased range of motion;Decreased strength;Difficulty walking Rehab Potential: Good PT Treatment/Interventions: Gait training;Manual techniques;Functional mobility training;Therapeutic exercise;Therapeutic activities;Neuromuscular re-education;Modalities;Stair training PT Plan: Current focus on improvin lumbar spine  extension with focus on increasing lumbar extension during sitting. next session introduce self lumbar mobilization exercise with towel and assess for improved symptoms. Introduse 3D Thoracic spine excursions and  quadraped lumbar extenison next session.     Goals PT Short Term Goals PT Short Term Goal 2: Patient will be able to sit for 19min without complain of pain.  PT Short Term Goal 2 - Progress: Progressing toward goal PT Short Term Goal 4: Patient's hip extension will improve to 10 degrees bilaterally to improved stride length.  PT Short Term Goal 4 - Progress: Progressing toward goal PT Long Term Goals PT Long Term Goal 2: Patient will be able to sit >2 hours without pain to be able to enjoy watching a movie PT Long Term Goal 2 - Progress: Progressing toward goal Long Term Goal 3: Patient will be able to sleep without waking due to pain <2 x per night to improve sleep quality Long Term Goal 3 Progress: Progressing toward goal Long Term Goal 4: patient will be able to walk with equal stride length, with hip extension ROM 15 degrees  so she may quickly ambulate in grocery store.  Long Term Goal 4 Progress: Progressing toward goal PT Long Term Goal 5: Patient will be able to tolerate full spinal rotaton ROM without pain Long Term Goal 5 Progress: Progressing toward goal  Problem List Patient Active Problem List   Diagnosis Date Noted  . Low back pain 02/22/2014  . Rectal bleeding 07/07/2013  . Hemorrhoid 07/07/2013  . Paroxysmal SVT (supraventricular tachycardia) 05/11/2013  . Drug-induced bradycardia 05/11/2013  . Pacemaker 05/11/2013  . Vomiting 06/03/2011  . Abdominal pain, acute, generalized 06/03/2011  . ANXIETY 09/18/2008  . HEMORRHOIDS, INTERNAL 09/18/2008  . IRRITABLE BOWEL SYNDROME 09/18/2008  . RECTAL BLEEDING 09/18/2008  . OSTEOPOROSIS 09/18/2008  . ARTERIOVENOUS MALFORMATION 09/18/2008  . WEIGHT LOSS 09/18/2008  . Diarrhea 09/18/2008    PT - End of  Session Activity Tolerance: Patient tolerated treatment well General Behavior During Therapy: WFL for tasks assessed/performed PT Plan of Care PT Home Exercise Plan: in addition to current HEP of Bridging and Bent knee raises in hook laying, continued hip flexor stretch, seated piriformis stretch, forward and lateral lunging. Introduced hooklaying rotation exercise to progress hip and lumbar rotation mobility. added seated extension PT Patient Instructions: perform twice daily Consulted and Agree with Plan of Care: Patient  GP    Suzette Battiest Abbie Jablon 04/18/2014, 12:08 PM

## 2014-04-19 ENCOUNTER — Ambulatory Visit (HOSPITAL_COMMUNITY): Payer: BC Managed Care – PPO | Admitting: Physical Therapy

## 2014-04-25 ENCOUNTER — Ambulatory Visit (HOSPITAL_COMMUNITY)
Admission: RE | Admit: 2014-04-25 | Discharge: 2014-04-25 | Disposition: A | Discharge status: Home / Self Care | Payer: BC Managed Care – PPO | Source: Ambulatory Visit | Attending: Internal Medicine | Admitting: Internal Medicine

## 2014-04-25 DIAGNOSIS — M545 Low back pain, unspecified: Secondary | ICD-10-CM

## 2014-04-25 NOTE — Progress Notes (Signed)
Physical Therapy Treatment Patient Details  Name: [name] MRN: [id number] Date of Birth: 22-Jan-1951  Today's Date: 04/25/2014 Time: 1110-1155 PT Time Calculation (min): 45 min  Charges: manual therapy: 1110-1130, TherEx 1130-1155 Visit#: 9 of 16  Re-eval: 05/04/14 Assessment Diagnosis: low back pain secondary to hip malalignment and limited abdominal bracing resulting in difficulty with prolonged sitting.  Next MD Visit: Marcelline Mates end of May, 2015 Prior Therapy: no  Authorization: blue cross blue shield   Authorization Visit#: 9 of 16   Subjective: Symptoms/Limitations Symptoms: Patient states that she had a bad flair up last night and had a lot of difficulty seeping due to the pain.  Pertinent History: Patient is a 63 y/o Female who present to therapy with a long history of back pain (>3 years), with cuurent episdoe beginning 12/25/13, Patient recieved a cortizone injection 02/09/14 at a hospital in [place] since when she has noticed decreased LE pain but continued, though improved back pain.  Pain Assessment Pain Score: 6   Precautions/Restrictions     Exercise/Treatments Mobility Seated self-lumbar mobilization with towel 2x10x with 3 second hold Stretches Prone on Elbows Stretch: 5 reps;10 seconds Press Ups: 5 reps (5sec) Piriformis Stretch: Other (comment) (10x 3seconds 3 way seated) Standing Other Standing Lumbar Exercises: 2 way pick up and reach with same side leg forward with 2lb dumbbell 10x Seated Other Seated Lumbar Exercises: seated self-lumbar mobilization with towel  Seated Sagittal plane thoracici spine excursion with overhead reach with flexion ROM limited 20x Supine Bridge: 10 reps Other Supine Lumbar Exercises: hooklaying hip rotation 10x Quadruped [place]cat/[place] Horse: 10 reps  Manual Therapy Joint Mobilization: Grade 1-2 central spinous process posterior to anterior mobilizzations of L1-L5 Myofascial Release: multifidis, and lumbar paraspinalstaken  58min  Physical Therapy Assessment and Plan PT Assessment and Plan Clinical Impression Statement: Patient arrives to therapy today with noted increased lumbar paraspinal muscle tenderness/stiffness and increased pain. Patient notes that she had a very busy weekend. Patient responded well to lumbar paraspinal muscle myofascial release with decreased pain and increased mobility. Throughout session patient demonstrated decreased pain with extension based exercises and demonstrated good understanding and responded well to seated self-lumbar mobilization with towel while noting improved low back pain. Patient's hip pain was improved following piriformis stretching.  PT Treatment/Interventions: Gait training;Manual techniques;Functional mobility training;Therapeutic exercise;Therapeutic activities;Neuromuscular re-education;Modalities;Stair training    Goals PT Short Term Goals PT Short Term Goal 2: Patient will be able to sit for 79min without complain of pain.  PT Short Term Goal 2 - Progress: Progressing toward goal PT Short Term Goal 4: Patient's hip extension will improve to 10 degrees bilaterally to improved stride length.  PT Short Term Goal 4 - Progress: Progressing toward goal PT Long Term Goals PT Long Term Goal 2: Patient will be able to sit >2 hours without pain to be able to enjoy watching a movie PT Long Term Goal 2 - Progress: Progressing toward goal Long Term Goal 3: Patient will be able to sleep without waking due to pain <2 x per night to improve sleep quality Long Term Goal 3 Progress: Progressing toward goal Long Term Goal 4: patient will be able to walk with equal stride length, with hip extension ROM 15 degrees  so she may quickly ambulate in grocery store.  Long Term Goal 4 Progress: Progressing toward goal PT Long Term Goal 5: Patient will be able to tolerate full spinal rotaton ROM without pain Long Term Goal 5 Progress: Progressing toward goal  Problem List Patient Active  Problem List   Diagnosis Date Noted  . Low back pain 02/22/2014  . Rectal bleeding 07/07/2013  . Hemorrhoid 07/07/2013  . Paroxysmal SVT (supraventricular tachycardia) 05/11/2013  . Drug-induced bradycardia 05/11/2013  . Pacemaker 05/11/2013  . Vomiting 06/03/2011  . Abdominal pain, acute, generalized 06/03/2011  . ANXIETY 09/18/2008  . HEMORRHOIDS, INTERNAL 09/18/2008  . IRRITABLE BOWEL SYNDROME 09/18/2008  . RECTAL BLEEDING 09/18/2008  . OSTEOPOROSIS 09/18/2008  . ARTERIOVENOUS MALFORMATION 09/18/2008  . WEIGHT LOSS 09/18/2008  . Diarrhea 09/18/2008    PT - End of Session Activity Tolerance: Patient tolerated treatment well General Behavior During Therapy: [facility] for tasks assessed/performed  GP    Avriana Joo R Saahas Hidrogo PT DPT 04/25/2014, 12:05 PM

## 2014-04-26 ENCOUNTER — Ambulatory Visit (HOSPITAL_COMMUNITY)
Admission: RE | Admit: 2014-04-26 | Discharge: 2014-04-26 | Disposition: A | Discharge status: Home / Self Care | Payer: BC Managed Care – PPO | Source: Ambulatory Visit | Attending: Internal Medicine | Admitting: Internal Medicine

## 2014-04-26 NOTE — Progress Notes (Signed)
Physical Therapy Treatment Patient Details  Name: [name] MRN: [id number] Date of Birth: 13-Feb-1951  Today's Date: 04/26/2014 Time: 3235-5732 PT Time Calculation (min): 40 min  Visit#: 10 of 16  Re-eval: 05/04/14 Authorization: blue cross blue shield  Authorization Visit#: 10 of 16  Charges:  Manual 1108-1122 (14'), [place] 1123-1131 (8'), therex 2025-4270 (16')  Subjective: Symptoms/Limitations Symptoms: Pt states her pain remains constant.  Reports compliance with HEP, however no lasting pain relief. States she has had 2 shots with pain relief only for 2 days.  States MD is trying to avoid surgery due to pacemaker.  Pain Assessment Currently in Pain?: Yes Pain Score: 6  Pain Location: Back Pain Orientation: Mid;Lower   Exercise/Treatments Stretches Active Hamstring Stretch: 3 reps;20 seconds Prone on Elbows Stretch: 5 reps;10 seconds Supine Bridge: 10 reps Straight Leg Raise: 10 reps Prone  Straight Leg Raise: 10 reps  Modalities Modalities: Ultrasound Manual Therapy Manual Therapy: Other (comment) Other Manual Therapy: prone lying myofascial techniques for bilateral lumbar musculature followed by [place]  Ultrasound Ultrasound Location: bilateral lower lumbar, 4' each  Ultrasound Parameters: 1.5 w/cm2 continuous 8' total in prone lying Ultrasound Goals: Pain  Physical Therapy Assessment and Plan PT Assessment and Plan Clinical Impression Statement: Manual techniques completed in prone lying to decrease spasm and pain prior to therex.  Noted large spasm in Rt lumbar musculature and able to decrease approx 25%.  Added [place] to further relax muscle tissue/spasms and decrease pain  Gentle stretches and hip extensor strengtheing exercises completed without complaint of pain.  Pt trying to avoid another shot (has had 2 shots) and utimately surgery.  Discussed trial of traction if no lasting pain relief as pt reported having a herniated disc/radiating pain. Marland Kitchen PT Plan: Current  focus on improvin lumbar spine extension with focus on increasing lumbar extension during sitting. progress to thoracic 3D excursions, quadruped lumbar extension per evaluating therapist.  Continue [place] if lasting pain relief; discuss trial of lumbar traction if no pain relief next session.     Problem List Patient Active Problem List   Diagnosis Date Noted  . Low back pain 02/22/2014  . Rectal bleeding 07/07/2013  . Hemorrhoid 07/07/2013  . Paroxysmal SVT (supraventricular tachycardia) 05/11/2013  . Drug-induced bradycardia 05/11/2013  . Pacemaker 05/11/2013  . Vomiting 06/03/2011  . Abdominal pain, acute, generalized 06/03/2011  . ANXIETY 09/18/2008  . HEMORRHOIDS, INTERNAL 09/18/2008  . IRRITABLE BOWEL SYNDROME 09/18/2008  . RECTAL BLEEDING 09/18/2008  . OSTEOPOROSIS 09/18/2008  . ARTERIOVENOUS MALFORMATION 09/18/2008  . WEIGHT LOSS 09/18/2008  . Diarrhea 09/18/2008    PT - End of Session Activity Tolerance: Patient tolerated treatment well General Behavior During Therapy: WFL for tasks assessed/performed   Teena Irani, PTA/CLT 04/26/2014, 12:08 PM

## 2014-05-02 ENCOUNTER — Ambulatory Visit (HOSPITAL_COMMUNITY)
Admission: RE | Admit: 2014-05-02 | Discharge: 2014-05-02 | Disposition: A | Discharge status: Home / Self Care | Payer: BC Managed Care – PPO | Source: Ambulatory Visit | Attending: Physical Therapy | Admitting: Physical Therapy

## 2014-05-02 DIAGNOSIS — M545 Low back pain, unspecified: Secondary | ICD-10-CM

## 2014-05-02 NOTE — Progress Notes (Signed)
Physical Therapy Treatment Patient Details  Name: [name] MRN: [id number] Date of Birth: 1951/06/11  Today's Date: 05/02/2014 Time: 1130-1215 PT Time Calculation (min): 45 min Charge: TE 1130-1153, Manual [id number], [place] 1207-1215  Visit#: 11 of 16  Re-eval: 05/04/14 Assessment Diagnosis: low back pain secondary to hip malalignment and limited abdominal bracing resulting in difficulty with prolonged sitting.  Next MD Visit: Marcelline Mates for injections 06/01/2014, Dr Carloyn Manner 05/09/2014  Authorization: blue cross blue shield  Authorization Time Period:    Authorization Visit#: 11 of 16   Subjective: Symptoms/Limitations Symptoms: Pt stated she feels she is getting better, decreased radicular symptoms.  Pt stated the [place] last session helped a lot.   Got new cream from MD apt yesterday and had a great night sleep.  Reports compliance with HEP.  States insurance is pending and wishes to continue for 1 more session following today.  Has apt for injection on 06/01/2014.   Pain Assessment Currently in Pain?: Yes Pain Score: 4  Pain Location: Back Pain Orientation: Mid;Lower  Objective:  Exercise/Treatments Stretches Prone on Elbows Stretch: 5 reps;10 seconds Standing Other Standing Lumbar Exercises: 3D hip excursion Seated Other Seated Lumbar Exercises: seated 3D thoracic excursion Prone  Straight Leg Raise: 10 reps   Manual Therapy Manual Therapy: Other (comment) Other Manual Therapy: prone lying myofascial techniques for bilateral lumbar musculature followed by [place]; lumbar extension with therapist facilitation Ultrasound Ultrasound Location: bilateral lower lumbar, 4' each Ultrasound Parameters: 1.5 w/cm2 continuous 8' total in prone lying Ultrasound Goals: Pain  Physical Therapy Assessment and Plan PT Assessment and Plan Clinical Impression Statement: Added thoracic and hip excursion exercises to improve mobilty per evaluating DPT.  Continued manual techniques in prone position to  reduce spasms in lumbar musculature and continuous [place] to further relax muscle tissues/spasms and decrease pain.  Pt reported pain reduced to 2/10 at end of session;   PT Plan: Continue 1 more session per pt request with financial issues.  Current focus on improvin lumbar spine extension with focus on increasing lumbar extension during sitting. progress to thoracic 3D excursions, quadruped lumbar extension per evaluating therapist.  Continue [place] if lasting pain relief; discuss trial of lumbar traction if no pain relief next session.    Goals PT Short Term Goals PT Short Term Goal 2: Patient will be able to sit for 61min without complain of pain.  PT Short Term Goal 2 - Progress: Progressing toward goal PT Short Term Goal 4: Patient's hip extension will improve to 10 degrees bilaterally to improved stride length.  PT Short Term Goal 4 - Progress: Progressing toward goal PT Long Term Goals PT Long Term Goal 2: Patient will be able to sit >2 hours without pain to be able to enjoy watching a movie Long Term Goal 3: Patient will be able to sleep without waking due to pain <2 x per night to improve sleep quality Long Term Goal 3 Progress: Progressing toward goal Long Term Goal 4: patient will be able to walk with equal stride length, with hip extension ROM 15 degrees  so she may quickly ambulate in grocery store.  Long Term Goal 4 Progress: Progressing toward goal PT Long Term Goal 5: Patient will be able to tolerate full spinal rotaton ROM without pain Long Term Goal 5 Progress: Progressing toward goal  Problem List Patient Active Problem List   Diagnosis Date Noted  . Low back pain 02/22/2014  . Rectal bleeding 07/07/2013  . Hemorrhoid 07/07/2013  . Paroxysmal SVT (supraventricular tachycardia)  05/11/2013  . Drug-induced bradycardia 05/11/2013  . Pacemaker 05/11/2013  . Vomiting 06/03/2011  . Abdominal pain, acute, generalized 06/03/2011  . ANXIETY 09/18/2008  . HEMORRHOIDS, INTERNAL  09/18/2008  . IRRITABLE BOWEL SYNDROME 09/18/2008  . RECTAL BLEEDING 09/18/2008  . OSTEOPOROSIS 09/18/2008  . ARTERIOVENOUS MALFORMATION 09/18/2008  . WEIGHT LOSS 09/18/2008  . Diarrhea 09/18/2008    PT - End of Session Activity Tolerance: Patient tolerated treatment well General Behavior During Therapy: [facility] for tasks assessed/performed  GP    Aldona Lento 05/02/2014, 12:53 PM

## 2014-05-03 ENCOUNTER — Ambulatory Visit (HOSPITAL_COMMUNITY)
Admission: RE | Admit: 2014-05-03 | Discharge: 2014-05-03 | Disposition: A | Discharge status: Home / Self Care | Payer: BC Managed Care – PPO | Source: Ambulatory Visit | Attending: Internal Medicine | Admitting: Internal Medicine

## 2014-05-03 DIAGNOSIS — M545 Low back pain, unspecified: Secondary | ICD-10-CM

## 2014-05-03 NOTE — Evaluation (Signed)
Physical Therapy Evaluation  Patient Details  Name: ALMA MOHIUDDIN MRN: [id number] Date of Birth: Jan 11, 1951  Today's Date: 05/03/2014 Time: 1100-1150 PT Time Calculation (min): 50 min     Charges: TherEx 1100-1146         Visit#: 12 of 16  Re-eval: 05/04/14 Assessment Diagnosis: low back pain secondary to hip malalignment and limited abdominal bracing resulting in difficulty with prolonged sitting.  Next MD Visit: Marcelline Mates for injections 06/01/2014, Dr Carloyn Manner 05/09/2014  Authorization: [employer]    Authorization Visit#: 12 of 16   Past Medical History:  Past Medical History  Diagnosis Date  . Osteoarthritis   . Anxiety   . Diverticulosis of colon   . Osteoporosis   . Tachy-brady syndrome   . Paroxysmal atrial fibrillation     with SVT response  . Sinus node dysfunction    Past Surgical History:  Past Surgical History  Procedure Laterality Date  . S/p hysterectomy    . Cholecystectomy    . Appendectomy    . Cervical spine surgery      two  . Vocal cord polypectomy    . Breast biopsy      right, benign  . Colonoscopy  2007    sigmoid diverticula, few ulcerations of terminal ileum, biopsies of TI unremarkable, random colon bx neg for microscopic colitis.  . Sb capsule study  2009    Two single small nonbleeding AVMs in the  proximal small bowel, single lymphangiectasia in mid small bowel.  . Permanent pacemaker insertion  07/2011  . Esophagogastroduodenoscopy  06/04/11    Dr. Vivi Ferns esophagus, deffuse petechial and gastric submucosal petechiae- benign mucousa on bx. Small bowel biopsy benign. No H. pylori.  . Colonoscopy  06/04/11    Dr. Gala Romney- pegmentation of the rectum, pancolonic diverticula, solitary ulcer in the mouth of the ileocecal valve, distal 15 cm of the terminal ileum appeared normal. Random colon biopsies were benign. Ileocecal valve ulcer or was benign without any supportive evidence of inflammatory bowel disease.     Subjective Symptoms/Limitations Symptoms: Patient states that sshe has no pain, only low back soreness following workign in garden Pertinent History: Patient is a 63 y/o Female who present to therapy with a long history of back pain (>3 years), with cuurent episdoe beginning 12/25/13, Patient recieved a cortizone injection 02/09/14 at a hospital in [place] since when she has noticed decreased LE pain but continued, though improved back pain.  How long can you sit comfortably?: no difficulty How long can you stand comfortably?: WFL How long can you walk comfortably?: WFL Patient Stated Goals: no longer having difficulty sleeping.  Pain Assessment Currently in Pain?: No/denies  Assessment RLE AROM (degrees) Right Hip Extension: 10 RLE Strength Right Hip Flexion: 5/5 Right Hip Extension: 4/5 Right Hip ABduction:  (4-/5) LLE AROM (degrees) Left Hip Extension: 10 LLE Strength Left Hip Flexion: 5/5 Left Hip Extension: 4/5 Left Hip ABduction: 4/5 Lumbar AROM Lumbar Flexion: 95% (5% limited) Lumbar Extension: 90% (10% limited) Lumbar - Right Rotation: 80% Lumbar - Left Rotation: 80%  Exercise/Treatments Stretches Active Hamstring Stretch: 3 reps;20 seconds Double Knee to Chest Stretch: 4 reps;20 seconds Hip Flexor Stretch: 4 reps;10 seconds Prone on Elbows Stretch: 5 reps;10 seconds Press Ups: 5 reps (5sec) Piriformis Stretch: Other (comment) (10x 3seconds 3 way seated) Standing Forward Lunge: 10 reps Side Lunge: 10 reps Other Standing Lumbar Exercises: 3D hip excursion Seated Other Seated Lumbar Exercises: seated 3D thoracic excursion Supine Bent Knee Raise: 10 reps;2  seconds Other Supine Lumbar Exercises: hooklaying hip rotation 10x  Physical Therapy Assessment and Plan PT Assessment and Plan Clinical Impression Statement: Patient has met all goals and is appropriate for discharge. This session focused on educating patient in strenghening exercises to progress strength  independently with HEP. Patient demonstrates good understanding of all exercises.  PT Plan: Patient discharged from physical therapy following meeting all functional goals.     Goals PT Short Term Goals PT Short Term Goal 1: Patient will be independent with correct performance of HEP PT Short Term Goal 1 - Progress: Met PT Short Term Goal 2: Patient will be able to sit for 64mn without complain of pain.  PT Short Term Goal 2 - Progress: Met PT Short Term Goal 3: Patient will be able to bend forward to pick up trash off floor with pain <2/10  PT Short Term Goal 3 - Progress: Met PT Short Term Goal 4: Patient's hip extension will improve to 10 degrees bilaterally to improved stride length.  PT Short Term Goal 4 - Progress: Met PT Short Term Goal 5: patient will able to perform straight leg raise for 10 seconds with abdominal bracing indicating improved core control to improve spine and hip stability during gait and standing activities PT Short Term Goal 5 - Progress: Met PT Long Term Goals PT Long Term Goal 1: Patient will be able to bend forward to pick up trash off floor with no pain and perform repetitive trunk flexion and extension through full ROM so she may work in her garden PT Long Term Goal 1 - Progress: Met PT Long Term Goal 2: Patient will be able to sit >2 hours without pain to be able to enjoy watching a movie PT Long Term Goal 2 - Progress: Met Long Term Goal 3: Patient will be able to sleep without waking due to pain <2 x per night to improve sleep quality Long Term Goal 3 Progress: Met Long Term Goal 4: patient will be able to walk with equal stride length, with hip extension ROM 15 degrees  so she may quickly ambulate in grocery store.  Long Term Goal 4 Progress: Met PT Long Term Goal 5: Patient will be able to tolerate full spinal rotaton ROM without pain  Problem List Patient Active Problem List   Diagnosis Date Noted  . Low back pain 02/22/2014  . Rectal bleeding  07/07/2013  . Hemorrhoid 07/07/2013  . Paroxysmal SVT (supraventricular tachycardia) 05/11/2013  . Drug-induced bradycardia 05/11/2013  . Pacemaker 05/11/2013  . Vomiting 06/03/2011  . Abdominal pain, acute, generalized 06/03/2011  . ANXIETY 09/18/2008  . HEMORRHOIDS, INTERNAL 09/18/2008  . IRRITABLE BOWEL SYNDROME 09/18/2008  . RECTAL BLEEDING 09/18/2008  . OSTEOPOROSIS 09/18/2008  . ARTERIOVENOUS MALFORMATION 09/18/2008  . WEIGHT LOSS 09/18/2008  . Diarrhea 09/18/2008    PT - End of Session Activity Tolerance: Patient tolerated treatment well General Behavior During Therapy: WFL for tasks assessed/performed PT Plan of Care PT Home Exercise Plan:  Bridging, Bent knee raises in hook laying, hip flexor stretch, seated piriformis stretch, forward and lateral lunging, hooklaying rotation exercise to progress hip and lumbar rotation mobility, seated extension PT Patient Instructions: perform twice daily Consulted and Agree with Plan of Care: Patient  GP Functional Assessment Tool Used: FOTO: 24% impaired was 37% impaired physical funtional limitation Functional Limitation: Changing and maintaining body position Changing and Maintaining Body Position Current Status ((C5852: At least 20 percent but less than 40 percent impaired, limited or restricted Changing and Maintaining  Body Position Goal Status [phone]): At least 20 percent but less than 40 percent impaired, limited or restricted Changing and Maintaining Body Position Discharge Status [phone]): At least 20 percent but less than 40 percent impaired, limited or restricted  Suzette Battiest Anna-Marie Coller PT DPT 05/03/2014, 12:03 PM  Physician Documentation Your signature is required to indicate approval of the treatment plan as stated above.  Please sign and either send electronically or make a copy of this report for your files and return this physician signed original.   Please mark one 1.__approve of plan  2. ___approve of plan with the following  conditions.   ______________________________                                                          _____________________ Physician Signature                                                                                                             Date

## 2014-06-30 ENCOUNTER — Other Ambulatory Visit (HOSPITAL_COMMUNITY): Payer: Self-pay | Admitting: Internal Medicine

## 2014-06-30 DIAGNOSIS — Z0000 Encounter for general adult medical examination without abnormal findings: Secondary | ICD-10-CM

## 2014-06-30 DIAGNOSIS — R3129 Other microscopic hematuria: Secondary | ICD-10-CM

## 2014-06-30 DIAGNOSIS — M810 Age-related osteoporosis without current pathological fracture: Secondary | ICD-10-CM

## 2014-07-04 ENCOUNTER — Encounter: Payer: Self-pay | Admitting: Cardiovascular Disease

## 2014-07-04 ENCOUNTER — Ambulatory Visit (HOSPITAL_COMMUNITY)
Admission: RE | Admit: 2014-07-04 | Discharge: 2014-07-04 | Disposition: A | Discharge status: Home / Self Care | Payer: BC Managed Care – PPO | Source: Ambulatory Visit | Attending: Internal Medicine | Admitting: Internal Medicine

## 2014-07-04 ENCOUNTER — Ambulatory Visit (INDEPENDENT_AMBULATORY_CARE_PROVIDER_SITE_OTHER): Payer: BC Managed Care – PPO | Admitting: Cardiovascular Disease

## 2014-07-04 ENCOUNTER — Other Ambulatory Visit (HOSPITAL_COMMUNITY): Payer: Self-pay | Admitting: Internal Medicine

## 2014-07-04 VITALS — BP 116/68 | HR 60 | Resp 16 | Ht 62.0 in | Wt 142.9 lb

## 2014-07-04 DIAGNOSIS — R3129 Other microscopic hematuria: Secondary | ICD-10-CM | POA: Insufficient documentation

## 2014-07-04 DIAGNOSIS — M129 Arthropathy, unspecified: Secondary | ICD-10-CM | POA: Insufficient documentation

## 2014-07-04 DIAGNOSIS — I471 Supraventricular tachycardia: Secondary | ICD-10-CM

## 2014-07-04 DIAGNOSIS — Z950 Presence of cardiac pacemaker: Secondary | ICD-10-CM

## 2014-07-04 DIAGNOSIS — I480 Paroxysmal atrial fibrillation: Secondary | ICD-10-CM

## 2014-07-04 DIAGNOSIS — I4891 Unspecified atrial fibrillation: Secondary | ICD-10-CM

## 2014-07-04 LAB — MDC_IDC_ENUM_SESS_TYPE_INCLINIC
Battery Remaining Longevity: 120 mo
Battery Voltage: 2.95 V
Brady Statistic RA Percent Paced: 46 %
Brady Statistic RV Percent Paced: 1.4 %
Date Time Interrogation Session: 20150721155351
Implantable Pulse Generator Model: 2210
Implantable Pulse Generator Serial Number: 7250678
Lead Channel Impedance Value: 450 Ohm
Lead Channel Impedance Value: 525 Ohm
Lead Channel Pacing Threshold Amplitude: 0.625 V
Lead Channel Pacing Threshold Amplitude: 0.875 V
Lead Channel Pacing Threshold Pulse Width: 0.5 ms
Lead Channel Pacing Threshold Pulse Width: 0.5 ms
Lead Channel Sensing Intrinsic Amplitude: 4.2 mV
Lead Channel Sensing Intrinsic Amplitude: 4.7 mV
Lead Channel Setting Pacing Amplitude: 1.125
Lead Channel Setting Pacing Amplitude: 1.625
Lead Channel Setting Pacing Pulse Width: 0.5 ms
Lead Channel Setting Sensing Sensitivity: 1 mV

## 2014-07-04 MED ORDER — SODIUM CHLORIDE 0.9 % IV SOLN
INTRAVENOUS | Status: AC
  Filled 2014-07-04: qty 250

## 2014-07-04 MED ORDER — IOHEXOL 300 MG/ML  SOLN
125.0000 mL | Freq: Once | INTRAMUSCULAR | Status: AC | PRN
  Administered 2014-07-04: 125 mL via INTRAVENOUS

## 2014-07-04 NOTE — Patient Instructions (Addendum)
Take Aspirin 81mg   two times a week.  Remote monitoring is used to monitor your Pacemaker from home. This monitoring reduces the number of office visits required to check your device to one time per year. It allows [place] to monitor the functioning of your device to ensure it is working properly. You are scheduled for a device check from home on October 05, 2014. You may send your transmission at any time that day. If you have a wireless device, the transmission will be sent automatically. After your physician reviews your transmission, you will receive a postcard with your next transmission date.  Dr. Sallyanne Kuster recommends that you schedule a follow-up appointment in: One year.

## 2014-07-05 ENCOUNTER — Encounter: Payer: Self-pay | Admitting: Cardiovascular Disease

## 2014-07-05 NOTE — Progress Notes (Signed)
Patient ID: [name], female   DOB: Aug 09, 1951, 63 y.o.   MRN: [id number] \     Reason for office visit Pacemaker follow-up, paroxysmal atrial tachycardia  [name] feels well and continues to remark on the major improvement in quality of life after pacemaker implantation. She is very rarely aware of isolated palpitations. Pacemaker interrogation shows about 50% A pacing, virtually no V pacing and occasional episodes of sustained but brief paroxysmal atrial tachycardia (longest 45 minutes, overall burden<1%).   Allergies  Allergen Reactions  . Ampicillin     REACTION: UNKNOWN REACTION  . Iodine Swelling    Tongue swelling. Difficulty breathing. Contrast for imaging study.    Current Outpatient Prescriptions  Medication Sig Dispense Refill  . ALPRAZolam (XANAX) 0.5 MG tablet Take 0.5 mg by mouth as needed.       Marland Kitchen aspirin 81 MG tablet Take 81 mg by mouth 2 (two) times a week.       . baclofen (LIORESAL) 10 MG tablet Take 5-10 mg by mouth 3 (three) times daily as needed for muscle spasms.      . Multiple Vitamin (MULTIVITAMIN) tablet Take 1 tablet by mouth daily.      . Omega-3 Fatty Acids (FISH OIL) 1200 MG CAPS Take 2 capsules by mouth daily.      Marland Kitchen oxyCODONE-acetaminophen (PERCOCET) 10-325 MG per tablet Take 1 tablet by mouth every 6 (six) hours as needed.      Marland Kitchen PRESCRIPTION MEDICATION Dic/Bac/Gab/Lid/Menth Cream - Apply 1-2 grams topically 3-4 times daily as needed.      . tobramycin-dexamethasone (TOBRADEX) ophthalmic solution Place 1 drop into both eyes daily as needed.      . vitamin B-12 (CYANOCOBALAMIN) 500 MCG tablet Take 500 mcg by mouth daily.      Marland Kitchen zolpidem (AMBIEN) 10 MG tablet 10 mg at bedtime.        No current facility-administered medications for this visit.    Past Medical History  Diagnosis Date  . Osteoarthritis   . Anxiety   . Diverticulosis of colon   . Osteoporosis   . Tachy-brady syndrome   . Paroxysmal atrial fibrillation     with SVT  response  . Sinus node dysfunction     Past Surgical History  Procedure Laterality Date  . S/p hysterectomy    . Cholecystectomy    . Appendectomy    . Cervical spine surgery      two  . Vocal cord polypectomy    . Breast biopsy      right, benign  . Colonoscopy  2007    sigmoid diverticula, few ulcerations of terminal ileum, biopsies of TI unremarkable, random colon bx neg for microscopic colitis.  . Sb capsule study  2009    Two single small nonbleeding AVMs in the  proximal small bowel, single lymphangiectasia in mid small bowel.  . Permanent pacemaker insertion  07/2011  . Esophagogastroduodenoscopy  06/04/11    Dr. Vivi Ferns esophagus, deffuse petechial and gastric submucosal petechiae- benign mucousa on bx. Small bowel biopsy benign. No H. pylori.  . Colonoscopy  06/04/11    Dr. Gala Romney- pegmentation of the rectum, pancolonic diverticula, solitary ulcer in the mouth of the ileocecal valve, distal 15 cm of the terminal ileum appeared normal. Random colon biopsies were benign. Ileocecal valve ulcer or was benign without any supportive evidence of inflammatory bowel disease.    Family History  Problem Relation Age of Onset  . GI problems Sister     had  colostomy (infection)  . Colon cancer Neg Hx   . Inflammatory bowel disease Neg Hx   . Stroke Father 32  . Heart attack Father 26  . COPD Mother 69  . Hypertension Brother   . Diabetes Brother     History   Social History  . Marital Status: Divorced    Spouse Name: N/A    Number of Children: N/A  . Years of Education: N/A   Occupational History  . [employer]    Social History Main Topics  . Smoking status: Never Smoker   . Smokeless tobacco: Not on file  . Alcohol Use: No  . Drug Use: No  . Sexual Activity: Not on file   Other Topics Concern  . Not on file   Social History Narrative   Has one son    Review of systems: The patient specifically denies any chest pain at rest or with exertion, dyspnea at rest  or with exertion, orthopnea, paroxysmal nocturnal dyspnea, syncope, palpitations, focal neurological deficits, intermittent claudication, lower extremity edema, unexplained weight gain, cough, hemoptysis or wheezing.  The patient also denies abdominal pain, nausea, vomiting, dysphagia, diarrhea, constipation, polyuria, polydipsia, dysuria, hematuria, frequency, urgency, abnormal bleeding or bruising, fever, chills, unexpected weight changes, mood swings, change in skin or hair texture, change in voice quality, auditory or visual problems, allergic reactions or rashes, new musculoskeletal complaints other than usual "aches and pains".   PHYSICAL EXAM BP 116/68  Pulse 60  Resp 16  Ht 5\' 2"  (1.575 m)  Wt 142 lb 14.4 oz (64.819 kg)  BMI 26.13 kg/m2  General: Alert, oriented x3, no distress Head: no evidence of trauma, PERRL, EOMI, no exophtalmos or lid lag, no myxedema, no xanthelasma; normal ears, nose and oropharynx Neck: normal jugular venous pulsations and no hepatojugular reflux; brisk carotid pulses without delay and no carotid bruits Chest: clear to auscultation, no signs of consolidation by percussion or palpation, normal fremitus, symmetrical and full respiratory excursions Cardiovascular: normal position and quality of the apical impulse, regular rhythm, normal first and second heart sounds, no murmurs, rubs or gallops Abdomen: no tenderness or distention, no masses by palpation, no abnormal pulsatility or arterial bruits, normal bowel sounds, no hepatosplenomegaly Extremities: no clubbing, cyanosis or edema; 2+ radial, ulnar and brachial pulses bilaterally; 2+ right femoral, posterior tibial and dorsalis pedis pulses; 2+ left femoral, posterior tibial and dorsalis pedis pulses; no subclavian or femoral bruits Neurological: grossly nonfocal   EKG: A paced V sensed  Lipid Panel     Component Value Date/Time   CHOL 156 07/17/2011 0605   TRIG 107 07/17/2011 0605   HDL 52 07/17/2011 0605    CHOLHDL 3.0 07/17/2011 0605   VLDL 21 07/17/2011 0605   LDLCALC 83 07/17/2011 0605    BMET    Component Value Date/Time   NA 142 07/17/2011 0605   K 4.0 07/17/2011 0605   CL 109 07/17/2011 0605   CO2 27 07/17/2011 0605   GLUCOSE 97 07/17/2011 0605   BUN 7 07/17/2011 0605   CREATININE 0.60 07/17/2011 0605   CREATININE 0.70 06/03/2011 0853   CALCIUM 8.8 07/17/2011 0605   GFRNONAA >60 07/17/2011 0605   GFRAA >60 07/17/2011 0605     ASSESSMENT AND PLAN  Paroxysmal SVT (supraventricular tachycardia)  Generally brief, she has had one sustained episode of SVT lasting about 45 minutes, probably ectopic atrial tachycardia, cannot exclude atrial flutter. No atrial fibrillation has ever been documented by her pacemaker. CHADSVasc score is zero. Aspirin is reasonable,  although not mandatory, but has caused her a lot of bruising. She is taking it only twice weekly. I do not think adjustment of meds is necessary based on the low symptom burden. If it becomes more of an issue , would increase beta blockers.  Pacemaker  [employer] DR RF, implanted August 2012. Normal device function   Patient Instructions  Take Aspirin 81mg   two times a week.  Remote monitoring is used to monitor your Pacemaker from home. This monitoring reduces the number of office visits required to check your device to one time per year. It allows [place] to monitor the functioning of your device to ensure it is working properly. You are scheduled for a device check from home on October 05, 2014. You may send your transmission at any time that day. If you have a wireless device, the transmission will be sent automatically. After your physician reviews your transmission, you will receive a postcard with your next transmission date.  Dr. Sallyanne Kuster recommends that you schedule a follow-up appointment in: One year.        Orders Placed This Encounter  Procedures  . Implantable device check  . EKG 12-Lead   Meds ordered this encounter  Medications    . oxyCODONE-acetaminophen (PERCOCET) 10-325 MG per tablet    Sig: Take 1 tablet by mouth every 6 (six) hours as needed.  . tobramycin-dexamethasone (TOBRADEX) ophthalmic solution    Sig: Place 1 drop into both eyes daily as needed.  . baclofen (LIORESAL) 10 MG tablet    Sig: Take 5-10 mg by mouth 3 (three) times daily as needed for muscle spasms.  Marland Kitchen PRESCRIPTION MEDICATION    Sig: Dic/Bac/Gab/Lid/Menth Cream - Apply 1-2 grams topically 3-4 times daily as needed.  . Omega-3 Fatty Acids (FISH OIL) 1200 MG CAPS    Sig: Take 2 capsules by mouth daily.  . vitamin B-12 (CYANOCOBALAMIN) 500 MCG tablet    Sig: Take 500 mcg by mouth daily.    Holli Humbles, MD, [place] [phone] office [phone] pager

## 2014-07-14 ENCOUNTER — Encounter: Payer: Self-pay | Admitting: Cardiovascular Disease

## 2014-07-18 ENCOUNTER — Ambulatory Visit (HOSPITAL_COMMUNITY): Payer: BC Managed Care – PPO

## 2014-07-18 ENCOUNTER — Other Ambulatory Visit (HOSPITAL_COMMUNITY): Payer: BC Managed Care – PPO

## 2014-07-20 ENCOUNTER — Ambulatory Visit (HOSPITAL_COMMUNITY)
Admission: RE | Admit: 2014-07-20 | Discharge: 2014-07-20 | Disposition: A | Discharge status: Home / Self Care | Payer: BC Managed Care – PPO | Source: Ambulatory Visit | Attending: Internal Medicine | Admitting: Internal Medicine

## 2014-07-20 DIAGNOSIS — Z0000 Encounter for general adult medical examination without abnormal findings: Secondary | ICD-10-CM

## 2014-07-20 DIAGNOSIS — M810 Age-related osteoporosis without current pathological fracture: Secondary | ICD-10-CM | POA: Insufficient documentation

## 2014-07-20 DIAGNOSIS — Z1231 Encounter for screening mammogram for malignant neoplasm of breast: Secondary | ICD-10-CM | POA: Insufficient documentation

## 2014-08-15 ENCOUNTER — Ambulatory Visit (INDEPENDENT_AMBULATORY_CARE_PROVIDER_SITE_OTHER): Payer: BC Managed Care – PPO | Admitting: Urology

## 2014-08-15 DIAGNOSIS — R3129 Other microscopic hematuria: Secondary | ICD-10-CM

## 2014-08-15 DIAGNOSIS — N281 Cyst of kidney, acquired: Secondary | ICD-10-CM

## 2014-10-05 ENCOUNTER — Encounter: Payer: BC Managed Care – PPO | Admitting: *Deleted

## 2014-10-05 ENCOUNTER — Telehealth: Payer: Self-pay | Admitting: Cardiology

## 2014-10-05 NOTE — Telephone Encounter (Signed)
LMOVM reminding pt to send remote transmission.   

## 2014-10-16 ENCOUNTER — Encounter: Payer: Self-pay | Admitting: Cardiology

## 2014-10-20 ENCOUNTER — Ambulatory Visit (INDEPENDENT_AMBULATORY_CARE_PROVIDER_SITE_OTHER): Payer: Commercial Managed Care - HMO | Admitting: *Deleted

## 2014-10-20 ENCOUNTER — Encounter: Payer: Self-pay | Admitting: Cardiovascular Disease

## 2014-10-20 DIAGNOSIS — I495 Sick sinus syndrome: Secondary | ICD-10-CM

## 2014-10-20 LAB — MDC_IDC_ENUM_SESS_TYPE_REMOTE
Battery Remaining Longevity: 88 mo
Battery Remaining Percentage: 68 %
Battery Voltage: 2.93 V
Brady Statistic AP VP Percent: 1 %
Brady Statistic AP VS Percent: 38 %
Brady Statistic AS VP Percent: 1 %
Brady Statistic AS VS Percent: 62 %
Brady Statistic RA Percent Paced: 38 %
Brady Statistic RV Percent Paced: 1 %
Date Time Interrogation Session: 20151106174751
Implantable Pulse Generator Model: 2210
Implantable Pulse Generator Serial Number: 7250678
Lead Channel Impedance Value: 450 Ohm
Lead Channel Impedance Value: 460 Ohm
Lead Channel Pacing Threshold Amplitude: 0.625 V
Lead Channel Pacing Threshold Amplitude: 0.875 V
Lead Channel Pacing Threshold Pulse Width: 0.5 ms
Lead Channel Pacing Threshold Pulse Width: 0.5 ms
Lead Channel Sensing Intrinsic Amplitude: 3.1 mV
Lead Channel Sensing Intrinsic Amplitude: 3.2 mV
Lead Channel Setting Pacing Amplitude: 1.125
Lead Channel Setting Pacing Amplitude: 1.625
Lead Channel Setting Pacing Pulse Width: 0.5 ms
Lead Channel Setting Sensing Sensitivity: 1 mV

## 2014-10-20 NOTE — Progress Notes (Signed)
Remote pacemaker transmission.   

## 2014-11-03 ENCOUNTER — Encounter: Payer: Self-pay | Admitting: Cardiology

## 2014-11-22 ENCOUNTER — Encounter: Payer: Self-pay | Admitting: Obstetrics & Gynecology

## 2015-01-15 DIAGNOSIS — J069 Acute upper respiratory infection, unspecified: Secondary | ICD-10-CM | POA: Diagnosis not present

## 2015-01-15 DIAGNOSIS — Z6826 Body mass index (BMI) 26.0-26.9, adult: Secondary | ICD-10-CM | POA: Diagnosis not present

## 2015-01-22 ENCOUNTER — Ambulatory Visit (INDEPENDENT_AMBULATORY_CARE_PROVIDER_SITE_OTHER): Payer: Commercial Managed Care - HMO | Admitting: *Deleted

## 2015-01-22 ENCOUNTER — Encounter: Payer: Self-pay | Admitting: Cardiovascular Disease

## 2015-01-22 DIAGNOSIS — I495 Sick sinus syndrome: Secondary | ICD-10-CM | POA: Diagnosis not present

## 2015-01-22 NOTE — Progress Notes (Signed)
Remote pacemaker transmission.   

## 2015-01-24 LAB — MDC_IDC_ENUM_SESS_TYPE_REMOTE
Battery Remaining Longevity: 87 mo
Battery Remaining Percentage: 66 %
Battery Voltage: 2.93 V
Brady Statistic AP VP Percent: 1 %
Brady Statistic AP VS Percent: 27 %
Brady Statistic AS VP Percent: 1 %
Brady Statistic AS VS Percent: 73 %
Brady Statistic RA Percent Paced: 27 %
Brady Statistic RV Percent Paced: 1 %
Date Time Interrogation Session: 20160208070020
Implantable Pulse Generator Model: 2210
Implantable Pulse Generator Serial Number: 7250678
Lead Channel Impedance Value: 460 Ohm
Lead Channel Impedance Value: 490 Ohm
Lead Channel Pacing Threshold Amplitude: 0.625 V
Lead Channel Pacing Threshold Amplitude: 0.875 V
Lead Channel Pacing Threshold Pulse Width: 0.5 ms
Lead Channel Pacing Threshold Pulse Width: 0.5 ms
Lead Channel Sensing Intrinsic Amplitude: 2.1 mV
Lead Channel Sensing Intrinsic Amplitude: 3.6 mV
Lead Channel Setting Pacing Amplitude: 1.125
Lead Channel Setting Pacing Amplitude: 1.625
Lead Channel Setting Pacing Pulse Width: 0.5 ms
Lead Channel Setting Sensing Sensitivity: 1 mV

## 2015-01-29 ENCOUNTER — Encounter: Payer: Self-pay | Admitting: Cardiology

## 2015-02-08 DIAGNOSIS — M47816 Spondylosis without myelopathy or radiculopathy, lumbar region: Secondary | ICD-10-CM | POA: Diagnosis not present

## 2015-02-08 DIAGNOSIS — M4302 Spondylolysis, cervical region: Secondary | ICD-10-CM | POA: Diagnosis not present

## 2015-03-06 ENCOUNTER — Encounter: Payer: Self-pay | Admitting: Cardiovascular Disease

## 2015-04-12 DIAGNOSIS — M4696 Unspecified inflammatory spondylopathy, lumbar region: Secondary | ICD-10-CM | POA: Diagnosis not present

## 2015-04-12 DIAGNOSIS — M47816 Spondylosis without myelopathy or radiculopathy, lumbar region: Secondary | ICD-10-CM | POA: Diagnosis not present

## 2015-04-23 ENCOUNTER — Ambulatory Visit (INDEPENDENT_AMBULATORY_CARE_PROVIDER_SITE_OTHER): Payer: Commercial Managed Care - HMO | Admitting: *Deleted

## 2015-04-23 ENCOUNTER — Encounter: Payer: Self-pay | Admitting: Cardiovascular Disease

## 2015-04-23 DIAGNOSIS — I495 Sick sinus syndrome: Secondary | ICD-10-CM | POA: Diagnosis not present

## 2015-04-23 NOTE — Progress Notes (Signed)
Remote pacemaker transmission.   

## 2015-04-27 LAB — CUP PACEART REMOTE DEVICE CHECK
Battery Remaining Longevity: 97 mo
Battery Remaining Percentage: 74 %
Battery Voltage: 2.95 V
Brady Statistic AP VP Percent: 1 %
Brady Statistic AP VS Percent: 26 %
Brady Statistic AS VP Percent: 1 %
Brady Statistic AS VS Percent: 74 %
Brady Statistic RA Percent Paced: 26 %
Brady Statistic RV Percent Paced: 1 %
Date Time Interrogation Session: 20160509074926
Lead Channel Impedance Value: 450 Ohm
Lead Channel Impedance Value: 490 Ohm
Lead Channel Pacing Threshold Amplitude: 0.625 V
Lead Channel Pacing Threshold Amplitude: 1 V
Lead Channel Pacing Threshold Pulse Width: 0.5 ms
Lead Channel Pacing Threshold Pulse Width: 0.5 ms
Lead Channel Sensing Intrinsic Amplitude: 3.7 mV
Lead Channel Sensing Intrinsic Amplitude: 4.7 mV
Lead Channel Setting Pacing Amplitude: 1.25 V
Lead Channel Setting Pacing Amplitude: 1.625
Lead Channel Setting Pacing Pulse Width: 0.5 ms
Lead Channel Setting Sensing Sensitivity: 1 mV
Pulse Gen Model: 2210
Pulse Gen Serial Number: 7250678

## 2015-05-01 ENCOUNTER — Encounter: Payer: Self-pay | Admitting: Cardiology

## 2015-05-16 DIAGNOSIS — M4696 Unspecified inflammatory spondylopathy, lumbar region: Secondary | ICD-10-CM | POA: Diagnosis not present

## 2015-05-16 DIAGNOSIS — M47816 Spondylosis without myelopathy or radiculopathy, lumbar region: Secondary | ICD-10-CM | POA: Diagnosis not present

## 2015-05-22 DIAGNOSIS — M47816 Spondylosis without myelopathy or radiculopathy, lumbar region: Secondary | ICD-10-CM | POA: Diagnosis not present

## 2015-05-22 DIAGNOSIS — M545 Low back pain: Secondary | ICD-10-CM | POA: Diagnosis not present

## 2015-05-29 DIAGNOSIS — M1991 Primary osteoarthritis, unspecified site: Secondary | ICD-10-CM | POA: Diagnosis not present

## 2015-05-29 DIAGNOSIS — F419 Anxiety disorder, unspecified: Secondary | ICD-10-CM | POA: Diagnosis not present

## 2015-05-29 DIAGNOSIS — G894 Chronic pain syndrome: Secondary | ICD-10-CM | POA: Diagnosis not present

## 2015-05-29 DIAGNOSIS — E663 Overweight: Secondary | ICD-10-CM | POA: Diagnosis not present

## 2015-05-29 DIAGNOSIS — G4700 Insomnia, unspecified: Secondary | ICD-10-CM | POA: Diagnosis not present

## 2015-05-30 DIAGNOSIS — M47816 Spondylosis without myelopathy or radiculopathy, lumbar region: Secondary | ICD-10-CM | POA: Diagnosis not present

## 2015-05-30 DIAGNOSIS — M545 Low back pain: Secondary | ICD-10-CM | POA: Diagnosis not present

## 2015-06-13 DIAGNOSIS — M47816 Spondylosis without myelopathy or radiculopathy, lumbar region: Secondary | ICD-10-CM | POA: Diagnosis not present

## 2015-06-13 DIAGNOSIS — M545 Low back pain: Secondary | ICD-10-CM | POA: Diagnosis not present

## 2015-06-25 ENCOUNTER — Other Ambulatory Visit (HOSPITAL_COMMUNITY): Payer: Self-pay | Admitting: Internal Medicine

## 2015-06-25 DIAGNOSIS — Z1231 Encounter for screening mammogram for malignant neoplasm of breast: Secondary | ICD-10-CM

## 2015-06-27 DIAGNOSIS — M47816 Spondylosis without myelopathy or radiculopathy, lumbar region: Secondary | ICD-10-CM | POA: Diagnosis not present

## 2015-06-27 DIAGNOSIS — M545 Low back pain: Secondary | ICD-10-CM | POA: Diagnosis not present

## 2015-07-12 DIAGNOSIS — Z0001 Encounter for general adult medical examination with abnormal findings: Secondary | ICD-10-CM | POA: Diagnosis not present

## 2015-07-12 DIAGNOSIS — E782 Mixed hyperlipidemia: Secondary | ICD-10-CM | POA: Diagnosis not present

## 2015-07-12 DIAGNOSIS — Z6825 Body mass index (BMI) 25.0-25.9, adult: Secondary | ICD-10-CM | POA: Diagnosis not present

## 2015-07-12 DIAGNOSIS — K219 Gastro-esophageal reflux disease without esophagitis: Secondary | ICD-10-CM | POA: Diagnosis not present

## 2015-07-13 DIAGNOSIS — E663 Overweight: Secondary | ICD-10-CM | POA: Diagnosis not present

## 2015-07-13 DIAGNOSIS — Z6825 Body mass index (BMI) 25.0-25.9, adult: Secondary | ICD-10-CM | POA: Diagnosis not present

## 2015-07-13 DIAGNOSIS — Z1389 Encounter for screening for other disorder: Secondary | ICD-10-CM | POA: Diagnosis not present

## 2015-07-23 ENCOUNTER — Ambulatory Visit (INDEPENDENT_AMBULATORY_CARE_PROVIDER_SITE_OTHER): Payer: Commercial Managed Care - HMO | Admitting: Cardiovascular Disease

## 2015-07-23 ENCOUNTER — Ambulatory Visit (HOSPITAL_COMMUNITY): Payer: Commercial Managed Care - HMO

## 2015-07-23 ENCOUNTER — Encounter: Payer: Self-pay | Admitting: Cardiovascular Disease

## 2015-07-23 VITALS — BP 126/66 | HR 60 | Resp 16 | Ht 62.0 in | Wt 148.1 lb

## 2015-07-23 DIAGNOSIS — Z950 Presence of cardiac pacemaker: Secondary | ICD-10-CM | POA: Diagnosis not present

## 2015-07-23 DIAGNOSIS — I471 Supraventricular tachycardia: Secondary | ICD-10-CM

## 2015-07-23 DIAGNOSIS — I495 Sick sinus syndrome: Secondary | ICD-10-CM | POA: Diagnosis not present

## 2015-07-23 NOTE — Patient Instructions (Addendum)
Remote monitoring is used to monitor your Pacemaker of ICD from home. This monitoring reduces the number of office visits required to check your device to one time per year. It allows [place] to keep an eye on the functioning of your device to ensure it is working properly. You are scheduled for a device check from home on October 23, 2015. You may send your transmission at any time that day. If you have a wireless device, the transmission will be sent automatically. After your physician reviews your transmission, you will receive a postcard with your next transmission date.  Dr Sallyanne Kuster recommends that you schedule a follow-up appointment in 1 year. You will receive a reminder letter in the mail two months in advance. If you don't receive a letter, please call our office to schedule the follow-up appointment.

## 2015-07-23 NOTE — Progress Notes (Signed)
Patient ID: [name], female   DOB: Nov 04, 1951, 64 y.o.   MRN: [id number]     Cardiology Office Note   Date:  07/23/2015   ID:  PRIMA RAYNER, DOB [birth year], MRN [id number]  PCP:  Glo Herring., MD  Cardiologist:   Sanda Klein, MD   Chief Complaint  Patient presents with  . Annual Exam    patient reports no complaints      History of Present Illness: [name] is a 64 y.o. female who presents for  Follow-up for tachycardia-bradycardia syndrome with symptomatic supraventricular tachycardia and drug-induced bradycardia requiring implantation of a dual-chamber permanent pacemaker. She feels well and has not had any palpitations. Interrogation of her pacemaker shows normal device function. Her dual-chamber St. Jude Accent DR device has an estimated generator longevity of another 8-9 years. There have been no episodes of SVT recorded recently. The longest spell lasted for only 10 seconds in early June. Last time she had an episode of tachycardia lasting more than minutes was in October 2015. Her underlying rhythm is mild sinus bradycardia at about 50 bpm. She has about 30% atrial pacing and never requires ventricular pacing.  Recently started on a very low dose of levothyroxine 25 g daily for an abnormal TSH level.    Past Medical History  Diagnosis Date  . Osteoarthritis   . Anxiety   . Diverticulosis of colon   . Osteoporosis   . Tachy-brady syndrome   . Paroxysmal atrial fibrillation     with SVT response  . Sinus node dysfunction     Past Surgical History  Procedure Laterality Date  . S/p hysterectomy    . Cholecystectomy    . Appendectomy    . Cervical spine surgery      two  . Vocal cord polypectomy    . Breast biopsy      right, benign  . Colonoscopy  2007    sigmoid diverticula, few ulcerations of terminal ileum, biopsies of TI unremarkable, random colon bx neg for microscopic colitis.  . Sb capsule study  2009    Two single small nonbleeding  AVMs in the  proximal small bowel, single lymphangiectasia in mid small bowel.  . Permanent pacemaker insertion  07/2011  . Esophagogastroduodenoscopy  06/04/11    Dr. Vivi Ferns esophagus, deffuse petechial and gastric submucosal petechiae- benign mucousa on bx. Small bowel biopsy benign. No H. pylori.  . Colonoscopy  06/04/11    Dr. Gala Romney- pegmentation of the rectum, pancolonic diverticula, solitary ulcer in the mouth of the ileocecal valve, distal 15 cm of the terminal ileum appeared normal. Random colon biopsies were benign. Ileocecal valve ulcer or was benign without any supportive evidence of inflammatory bowel disease.     Current Outpatient Prescriptions  Medication Sig Dispense Refill  . ALPRAZolam (XANAX) 0.5 MG tablet Take 0.5 mg by mouth as needed.     Marland Kitchen aspirin 81 MG tablet Take 81 mg by mouth 2 (two) times a week.     . Calcium Carbonate-Vitamin D (OSCAL 500/200 D-3 PO) Take 1 tablet by mouth daily.    . Cholecalciferol (VITAMIN D) 2000 UNITS CAPS Take 1 capsule by mouth daily.    Marland Kitchen FLUoxetine (PROZAC) 20 MG tablet Take 20 mg by mouth daily.    Marland Kitchen levothyroxine (SYNTHROID, LEVOTHROID) 25 MCG tablet Take 1 tablet by mouth daily.    . Multiple Vitamin (MULTIVITAMIN) tablet Take 1 tablet by mouth daily.    . Omega-3 Fatty Acids (FISH OIL) 1200 MG  CAPS Take 2 capsules by mouth daily.    . Oxycodone HCl 10 MG TABS Take 1 tablet by mouth 2 (two) times daily as needed.    . zolpidem (AMBIEN) 10 MG tablet 10 mg at bedtime.      No current facility-administered medications for this visit.    Allergies:   Ampicillin and Iodine    Social History:  The patient  reports that she has never smoked. She does not have any smokeless tobacco history on file. She reports that she does not drink alcohol or use illicit drugs.   Family History:  The patient's family history includes COPD (age of onset: 25) in her mother; Diabetes in her brother; GI problems in her sister; Heart attack (age of  onset: 74) in her father; Hypertension in her brother; Stroke (age of onset: 49) in her father. There is no history of Colon cancer or Inflammatory bowel disease.    ROS:  Please see the history of present illness.    Otherwise, review of systems positive for none.   All other systems are reviewed and negative.    PHYSICAL EXAM: VS:  BP 126/66 mmHg  Pulse 60  Resp 16  Ht 5\' 2"  (1.575 m)  Wt 148 lb 1.6 oz (67.178 kg)  BMI 27.08 kg/m2 , BMI Body mass index is 27.08 kg/(m^2).  General: Alert, oriented x3, no distress Head: no evidence of trauma, PERRL, EOMI, no exophtalmos or lid lag, no myxedema, no xanthelasma; normal ears, nose and oropharynx Neck: normal jugular venous pulsations and no hepatojugular reflux; brisk carotid pulses without delay and no carotid bruits Chest: clear to auscultation, no signs of consolidation by percussion or palpation, normal fremitus, symmetrical and full respiratory excursions,  Healthy left subclavian pacemaker site Cardiovascular: normal position and quality of the apical impulse, regular rhythm, normal first and second heart sounds, no murmurs, rubs or gallops Abdomen: no tenderness or distention, no masses by palpation, no abnormal pulsatility or arterial bruits, normal bowel sounds, no hepatosplenomegaly Extremities: no clubbing, cyanosis or edema; 2+ radial, ulnar and brachial pulses bilaterally; 2+ right femoral, posterior tibial and dorsalis pedis pulses; 2+ left femoral, posterior tibial and dorsalis pedis pulses; no subclavian or femoral bruits Neurological: grossly nonfocal Psych: euthymic mood, full affect   EKG:  EKG is ordered today. The ekg ordered today demonstrates  Atrial paced ventricular sensed otherwise normal   Recent Labs: No results found for requested labs within last 365 days.    Lipid Panel    Component Value Date/Time   CHOL 156 07/17/2011 0605   TRIG 107 07/17/2011 0605   HDL 52 07/17/2011 0605   CHOLHDL 3.0  07/17/2011 0605   VLDL 21 07/17/2011 0605   LDLCALC 83 07/17/2011 0605      Wt Readings from Last 3 Encounters:  07/23/15 148 lb 1.6 oz (67.178 kg)  07/04/14 142 lb 14.4 oz (64.819 kg)  07/07/13 131 lb 9.6 oz (59.693 kg)     .   ASSESSMENT AND PLAN:  1.  Supraventricular tachycardia with very infrequent episodes, asymptomatic 2.  Sinus node dysfunction with normal function of dual-chamber permanent pacemaker   Continue current medical therapy. Follow-up yearly in the office and every three-month remote pacemaker downloads.    Current medicines are reviewed at length with the patient today.  The patient does not have concerns regarding medicines.  The following changes have been made:  no change  Labs/ tests ordered today include:  No orders of the defined types were  placed in this encounter.     Patient Instructions  Remote monitoring is used to monitor your Pacemaker of ICD from home. This monitoring reduces the number of office visits required to check your device to one time per year. It allows [place] to keep an eye on the functioning of your device to ensure it is working properly. You are scheduled for a device check from home on October 23, 2015. You may send your transmission at any time that day. If you have a wireless device, the transmission will be sent automatically. After your physician reviews your transmission, you will receive a postcard with your next transmission date.  Dr Sallyanne Kuster recommends that you schedule a follow-up appointment in 1 year. You will receive a reminder letter in the mail two months in advance. If you don't receive a letter, please call our office to schedule the follow-up appointment.     Mikael Spray, MD  07/23/2015 5:32 PM    Sanda Klein, MD, [facility] [phone] office [phone] pager

## 2015-07-24 LAB — CUP PACEART INCLINIC DEVICE CHECK
Battery Remaining Percentage: 81 %
Battery Voltage: 2.93 V
Brady Statistic RA Percent Paced: 30 %
Brady Statistic RV Percent Paced: 1 % — CL
Date Time Interrogation Session: 20160809151300
Lead Channel Impedance Value: 450 Ohm
Lead Channel Impedance Value: 460 Ohm
Lead Channel Pacing Threshold Amplitude: 0.625 V
Lead Channel Pacing Threshold Amplitude: 1 V
Lead Channel Pacing Threshold Pulse Width: 0.4 ms
Lead Channel Pacing Threshold Pulse Width: 0.5 ms
Lead Channel Sensing Intrinsic Amplitude: 4 mV
Lead Channel Sensing Intrinsic Amplitude: 4.2 mV
Lead Channel Setting Pacing Amplitude: 1.25 V
Lead Channel Setting Pacing Amplitude: 1.75 V
Lead Channel Setting Pacing Pulse Width: 0.5 ms
Lead Channel Setting Sensing Sensitivity: 1 mV
Pulse Gen Model: 2210
Pulse Gen Serial Number: 7250678

## 2015-07-25 ENCOUNTER — Ambulatory Visit (HOSPITAL_COMMUNITY): Payer: Commercial Managed Care - HMO

## 2015-07-25 ENCOUNTER — Ambulatory Visit (HOSPITAL_COMMUNITY)
Admission: RE | Admit: 2015-07-25 | Discharge: 2015-07-25 | Disposition: A | Discharge status: Home / Self Care | Payer: Commercial Managed Care - HMO | Source: Ambulatory Visit | Attending: Internal Medicine | Admitting: Internal Medicine

## 2015-07-25 DIAGNOSIS — Z1231 Encounter for screening mammogram for malignant neoplasm of breast: Secondary | ICD-10-CM | POA: Diagnosis not present

## 2015-07-25 DIAGNOSIS — M545 Low back pain: Secondary | ICD-10-CM | POA: Diagnosis not present

## 2015-07-25 DIAGNOSIS — M47816 Spondylosis without myelopathy or radiculopathy, lumbar region: Secondary | ICD-10-CM | POA: Diagnosis not present

## 2015-08-02 ENCOUNTER — Encounter: Payer: Self-pay | Admitting: Cardiovascular Disease

## 2015-08-08 DIAGNOSIS — M545 Low back pain: Secondary | ICD-10-CM | POA: Diagnosis not present

## 2015-08-08 DIAGNOSIS — M47816 Spondylosis without myelopathy or radiculopathy, lumbar region: Secondary | ICD-10-CM | POA: Diagnosis not present

## 2015-08-21 DIAGNOSIS — Z1389 Encounter for screening for other disorder: Secondary | ICD-10-CM | POA: Diagnosis not present

## 2015-08-21 DIAGNOSIS — Z6826 Body mass index (BMI) 26.0-26.9, adult: Secondary | ICD-10-CM | POA: Diagnosis not present

## 2015-08-21 DIAGNOSIS — K219 Gastro-esophageal reflux disease without esophagitis: Secondary | ICD-10-CM | POA: Diagnosis not present

## 2015-08-21 DIAGNOSIS — E039 Hypothyroidism, unspecified: Secondary | ICD-10-CM | POA: Diagnosis not present

## 2015-08-21 DIAGNOSIS — E663 Overweight: Secondary | ICD-10-CM | POA: Diagnosis not present

## 2015-08-21 DIAGNOSIS — G894 Chronic pain syndrome: Secondary | ICD-10-CM | POA: Diagnosis not present

## 2015-08-21 DIAGNOSIS — E782 Mixed hyperlipidemia: Secondary | ICD-10-CM | POA: Diagnosis not present

## 2015-08-21 DIAGNOSIS — E559 Vitamin D deficiency, unspecified: Secondary | ICD-10-CM | POA: Diagnosis not present

## 2015-08-22 DIAGNOSIS — M47816 Spondylosis without myelopathy or radiculopathy, lumbar region: Secondary | ICD-10-CM | POA: Diagnosis not present

## 2015-08-22 DIAGNOSIS — M545 Low back pain: Secondary | ICD-10-CM | POA: Diagnosis not present

## 2015-08-30 DIAGNOSIS — M4696 Unspecified inflammatory spondylopathy, lumbar region: Secondary | ICD-10-CM | POA: Diagnosis not present

## 2015-10-22 ENCOUNTER — Ambulatory Visit (INDEPENDENT_AMBULATORY_CARE_PROVIDER_SITE_OTHER): Payer: Commercial Managed Care - HMO | Admitting: *Deleted

## 2015-10-22 ENCOUNTER — Telehealth: Payer: Self-pay | Admitting: Cardiology

## 2015-10-22 DIAGNOSIS — I495 Sick sinus syndrome: Secondary | ICD-10-CM | POA: Diagnosis not present

## 2015-10-22 NOTE — Telephone Encounter (Signed)
LMOVM reminding pt to send remote transmission.   

## 2015-10-23 NOTE — Progress Notes (Signed)
Remote pacemaker transmission.   

## 2015-10-24 ENCOUNTER — Encounter: Payer: Self-pay | Admitting: Cardiology

## 2015-10-24 LAB — CUP PACEART REMOTE DEVICE CHECK
Battery Remaining Longevity: 92 mo
Battery Remaining Percentage: 73 %
Battery Voltage: 2.92 V
Brady Statistic AP VP Percent: 1 %
Brady Statistic AP VS Percent: 57 %
Brady Statistic AS VP Percent: 1 %
Brady Statistic AS VS Percent: 43 %
Brady Statistic RA Percent Paced: 57 %
Brady Statistic RV Percent Paced: 1 %
Date Time Interrogation Session: 20161107192704
Implantable Lead Implant Date: 20120802
Implantable Lead Implant Date: 20120802
Implantable Lead Location: 753859
Implantable Lead Location: 753860
Lead Channel Impedance Value: 450 Ohm
Lead Channel Impedance Value: 450 Ohm
Lead Channel Pacing Threshold Amplitude: 0.625 V
Lead Channel Pacing Threshold Amplitude: 1 V
Lead Channel Pacing Threshold Pulse Width: 0.5 ms
Lead Channel Pacing Threshold Pulse Width: 0.5 ms
Lead Channel Sensing Intrinsic Amplitude: 3.7 mV
Lead Channel Sensing Intrinsic Amplitude: 4.1 mV
Lead Channel Setting Pacing Amplitude: 1.25 V
Lead Channel Setting Pacing Amplitude: 1.625
Lead Channel Setting Pacing Pulse Width: 0.5 ms
Lead Channel Setting Sensing Sensitivity: 1 mV
Pulse Gen Model: 2210
Pulse Gen Serial Number: 7250678

## 2015-12-04 DIAGNOSIS — M47816 Spondylosis without myelopathy or radiculopathy, lumbar region: Secondary | ICD-10-CM | POA: Diagnosis not present

## 2015-12-04 DIAGNOSIS — M4302 Spondylolysis, cervical region: Secondary | ICD-10-CM | POA: Diagnosis not present

## 2015-12-04 DIAGNOSIS — M4696 Unspecified inflammatory spondylopathy, lumbar region: Secondary | ICD-10-CM | POA: Diagnosis not present

## 2015-12-27 DIAGNOSIS — Z1389 Encounter for screening for other disorder: Secondary | ICD-10-CM | POA: Diagnosis not present

## 2015-12-27 DIAGNOSIS — G4700 Insomnia, unspecified: Secondary | ICD-10-CM | POA: Diagnosis not present

## 2015-12-27 DIAGNOSIS — M810 Age-related osteoporosis without current pathological fracture: Secondary | ICD-10-CM | POA: Diagnosis not present

## 2015-12-27 DIAGNOSIS — E782 Mixed hyperlipidemia: Secondary | ICD-10-CM | POA: Diagnosis not present

## 2015-12-27 DIAGNOSIS — Z6827 Body mass index (BMI) 27.0-27.9, adult: Secondary | ICD-10-CM | POA: Diagnosis not present

## 2015-12-27 DIAGNOSIS — E063 Autoimmune thyroiditis: Secondary | ICD-10-CM | POA: Diagnosis not present

## 2015-12-27 DIAGNOSIS — I4891 Unspecified atrial fibrillation: Secondary | ICD-10-CM | POA: Diagnosis not present

## 2015-12-27 DIAGNOSIS — E663 Overweight: Secondary | ICD-10-CM | POA: Diagnosis not present

## 2015-12-27 DIAGNOSIS — F329 Major depressive disorder, single episode, unspecified: Secondary | ICD-10-CM | POA: Diagnosis not present

## 2016-01-21 ENCOUNTER — Encounter: Payer: Medicare PPO | Admitting: *Deleted

## 2016-01-21 ENCOUNTER — Telehealth: Payer: Self-pay | Admitting: Cardiology

## 2016-01-21 NOTE — Telephone Encounter (Signed)
LMOVM reminding pt to send remote transmission.   

## 2016-01-22 ENCOUNTER — Encounter: Payer: Self-pay | Admitting: Cardiology

## 2016-01-29 ENCOUNTER — Ambulatory Visit (INDEPENDENT_AMBULATORY_CARE_PROVIDER_SITE_OTHER): Payer: Medicare PPO | Admitting: *Deleted

## 2016-01-29 DIAGNOSIS — I495 Sick sinus syndrome: Secondary | ICD-10-CM | POA: Diagnosis not present

## 2016-01-29 NOTE — Progress Notes (Signed)
Remote pacemaker transmission.   

## 2016-01-30 ENCOUNTER — Encounter: Payer: Self-pay | Admitting: Cardiology

## 2016-01-30 LAB — CUP PACEART REMOTE DEVICE CHECK
Battery Remaining Longevity: 92 mo
Battery Remaining Percentage: 73 %
Battery Voltage: 2.92 V
Brady Statistic AP VP Percent: 1 %
Brady Statistic AP VS Percent: 58 %
Brady Statistic AS VP Percent: 1 %
Brady Statistic AS VS Percent: 42 %
Brady Statistic RA Percent Paced: 57 %
Brady Statistic RV Percent Paced: 1 %
Date Time Interrogation Session: 20170214160402
Implantable Lead Implant Date: 20120802
Implantable Lead Implant Date: 20120802
Implantable Lead Location: 753859
Implantable Lead Location: 753860
Lead Channel Impedance Value: 450 Ohm
Lead Channel Impedance Value: 460 Ohm
Lead Channel Pacing Threshold Amplitude: 0.625 V
Lead Channel Pacing Threshold Amplitude: 1 V
Lead Channel Pacing Threshold Pulse Width: 0.5 ms
Lead Channel Pacing Threshold Pulse Width: 0.5 ms
Lead Channel Sensing Intrinsic Amplitude: 2 mV
Lead Channel Sensing Intrinsic Amplitude: 3.6 mV
Lead Channel Setting Pacing Amplitude: 1.25 V
Lead Channel Setting Pacing Amplitude: 1.625
Lead Channel Setting Pacing Pulse Width: 0.5 ms
Lead Channel Setting Sensing Sensitivity: 1 mV
Pulse Gen Model: 2210
Pulse Gen Serial Number: 7250678

## 2016-04-16 DIAGNOSIS — Z1389 Encounter for screening for other disorder: Secondary | ICD-10-CM | POA: Diagnosis not present

## 2016-04-16 DIAGNOSIS — Z6827 Body mass index (BMI) 27.0-27.9, adult: Secondary | ICD-10-CM | POA: Diagnosis not present

## 2016-04-16 DIAGNOSIS — G894 Chronic pain syndrome: Secondary | ICD-10-CM | POA: Diagnosis not present

## 2016-04-16 DIAGNOSIS — I480 Paroxysmal atrial fibrillation: Secondary | ICD-10-CM | POA: Diagnosis not present

## 2016-04-16 DIAGNOSIS — I2510 Atherosclerotic heart disease of native coronary artery without angina pectoris: Secondary | ICD-10-CM | POA: Diagnosis not present

## 2016-04-16 DIAGNOSIS — M1991 Primary osteoarthritis, unspecified site: Secondary | ICD-10-CM | POA: Diagnosis not present

## 2016-04-28 ENCOUNTER — Encounter: Payer: Self-pay | Admitting: Internal Medicine

## 2016-04-29 ENCOUNTER — Telehealth: Payer: Self-pay | Admitting: Cardiovascular Disease

## 2016-04-29 ENCOUNTER — Ambulatory Visit (INDEPENDENT_AMBULATORY_CARE_PROVIDER_SITE_OTHER): Payer: Commercial Managed Care - HMO | Admitting: *Deleted

## 2016-04-29 ENCOUNTER — Telehealth: Payer: Self-pay | Admitting: Cardiology

## 2016-04-29 DIAGNOSIS — I495 Sick sinus syndrome: Secondary | ICD-10-CM

## 2016-04-29 NOTE — Progress Notes (Signed)
Remote pacemaker transmission.   

## 2016-04-29 NOTE — Telephone Encounter (Signed)
Spoke w/ pt and informed her that we did receive her remote transmission on 04-28-16. She verbalized understanding and scheduled an appt w/ MD on 07-23-16 at 11:00 AM.

## 2016-04-29 NOTE — Telephone Encounter (Signed)
New Message  Pt called states that she was called to send another remote transmission. She states that she sent one in 04/28/2016. She states that if she sends another one she will be charged twice.   4. Are you calling to see if we received your device transmission? Yes

## 2016-04-29 NOTE — Telephone Encounter (Signed)
LMOVM reminding pt to send remote transmission.   

## 2016-05-16 DIAGNOSIS — J301 Allergic rhinitis due to pollen: Secondary | ICD-10-CM | POA: Diagnosis not present

## 2016-05-16 DIAGNOSIS — Z1389 Encounter for screening for other disorder: Secondary | ICD-10-CM | POA: Diagnosis not present

## 2016-05-16 DIAGNOSIS — J180 Bronchopneumonia, unspecified organism: Secondary | ICD-10-CM | POA: Diagnosis not present

## 2016-05-16 DIAGNOSIS — Z6827 Body mass index (BMI) 27.0-27.9, adult: Secondary | ICD-10-CM | POA: Diagnosis not present

## 2016-05-22 LAB — CUP PACEART REMOTE DEVICE CHECK
Battery Remaining Longevity: 92 mo
Battery Remaining Percentage: 73 %
Battery Voltage: 2.92 V
Brady Statistic AP VP Percent: 1 %
Brady Statistic AP VS Percent: 53 %
Brady Statistic AS VP Percent: 1 %
Brady Statistic AS VS Percent: 47 %
Brady Statistic RA Percent Paced: 53 %
Brady Statistic RV Percent Paced: 1 %
Date Time Interrogation Session: 20170515140534
Implantable Lead Implant Date: 20120802
Implantable Lead Implant Date: 20120802
Implantable Lead Location: 753859
Implantable Lead Location: 753860
Lead Channel Impedance Value: 450 Ohm
Lead Channel Impedance Value: 450 Ohm
Lead Channel Pacing Threshold Amplitude: 0.625 V
Lead Channel Pacing Threshold Amplitude: 1 V
Lead Channel Pacing Threshold Pulse Width: 0.5 ms
Lead Channel Pacing Threshold Pulse Width: 0.5 ms
Lead Channel Sensing Intrinsic Amplitude: 3.3 mV
Lead Channel Sensing Intrinsic Amplitude: 4.1 mV
Lead Channel Setting Pacing Amplitude: 1.25 V
Lead Channel Setting Pacing Amplitude: 1.625
Lead Channel Setting Pacing Pulse Width: 0.5 ms
Lead Channel Setting Sensing Sensitivity: 1 mV
Pulse Gen Model: 2210
Pulse Gen Serial Number: 7250678

## 2016-05-29 ENCOUNTER — Encounter: Payer: Self-pay | Admitting: Cardiology

## 2016-06-17 DIAGNOSIS — R278 Other lack of coordination: Secondary | ICD-10-CM | POA: Diagnosis not present

## 2016-06-17 DIAGNOSIS — Z01810 Encounter for preprocedural cardiovascular examination: Secondary | ICD-10-CM | POA: Diagnosis not present

## 2016-06-17 DIAGNOSIS — I495 Sick sinus syndrome: Secondary | ICD-10-CM | POA: Diagnosis not present

## 2016-06-17 DIAGNOSIS — R269 Unspecified abnormalities of gait and mobility: Secondary | ICD-10-CM | POA: Diagnosis not present

## 2016-06-17 DIAGNOSIS — S72142A Displaced intertrochanteric fracture of left femur, initial encounter for closed fracture: Secondary | ICD-10-CM | POA: Diagnosis not present

## 2016-06-17 DIAGNOSIS — R41841 Cognitive communication deficit: Secondary | ICD-10-CM | POA: Diagnosis not present

## 2016-06-17 DIAGNOSIS — M25552 Pain in left hip: Secondary | ICD-10-CM | POA: Diagnosis not present

## 2016-06-17 DIAGNOSIS — M160 Bilateral primary osteoarthritis of hip: Secondary | ICD-10-CM | POA: Diagnosis not present

## 2016-06-17 DIAGNOSIS — W19XXXA Unspecified fall, initial encounter: Secondary | ICD-10-CM | POA: Diagnosis not present

## 2016-06-17 DIAGNOSIS — G8911 Acute pain due to trauma: Secondary | ICD-10-CM | POA: Diagnosis not present

## 2016-06-17 DIAGNOSIS — Z950 Presence of cardiac pacemaker: Secondary | ICD-10-CM | POA: Diagnosis not present

## 2016-06-17 DIAGNOSIS — Z9181 History of falling: Secondary | ICD-10-CM | POA: Diagnosis not present

## 2016-06-17 DIAGNOSIS — M4120 Other idiopathic scoliosis, site unspecified: Secondary | ICD-10-CM | POA: Diagnosis not present

## 2016-06-17 DIAGNOSIS — M545 Low back pain: Secondary | ICD-10-CM | POA: Diagnosis not present

## 2016-06-17 DIAGNOSIS — G8929 Other chronic pain: Secondary | ICD-10-CM | POA: Diagnosis not present

## 2016-06-17 DIAGNOSIS — S72002A Fracture of unspecified part of neck of left femur, initial encounter for closed fracture: Secondary | ICD-10-CM | POA: Diagnosis not present

## 2016-06-17 DIAGNOSIS — T1490 Injury, unspecified: Secondary | ICD-10-CM | POA: Diagnosis not present

## 2016-06-17 DIAGNOSIS — S7290XA Unspecified fracture of unspecified femur, initial encounter for closed fracture: Secondary | ICD-10-CM | POA: Diagnosis not present

## 2016-06-17 DIAGNOSIS — W1830XA Fall on same level, unspecified, initial encounter: Secondary | ICD-10-CM | POA: Diagnosis not present

## 2016-06-17 DIAGNOSIS — S72102D Unspecified trochanteric fracture of left femur, subsequent encounter for closed fracture with routine healing: Secondary | ICD-10-CM | POA: Diagnosis not present

## 2016-06-17 DIAGNOSIS — E039 Hypothyroidism, unspecified: Secondary | ICD-10-CM | POA: Diagnosis not present

## 2016-06-17 DIAGNOSIS — R262 Difficulty in walking, not elsewhere classified: Secondary | ICD-10-CM | POA: Diagnosis not present

## 2016-06-17 DIAGNOSIS — M810 Age-related osteoporosis without current pathological fracture: Secondary | ICD-10-CM | POA: Diagnosis not present

## 2016-06-17 DIAGNOSIS — D62 Acute posthemorrhagic anemia: Secondary | ICD-10-CM | POA: Diagnosis not present

## 2016-06-22 DIAGNOSIS — R269 Unspecified abnormalities of gait and mobility: Secondary | ICD-10-CM | POA: Diagnosis not present

## 2016-06-22 DIAGNOSIS — D649 Anemia, unspecified: Secondary | ICD-10-CM | POA: Diagnosis not present

## 2016-06-22 DIAGNOSIS — F418 Other specified anxiety disorders: Secondary | ICD-10-CM | POA: Diagnosis not present

## 2016-06-22 DIAGNOSIS — Z471 Aftercare following joint replacement surgery: Secondary | ICD-10-CM | POA: Diagnosis not present

## 2016-06-22 DIAGNOSIS — E039 Hypothyroidism, unspecified: Secondary | ICD-10-CM | POA: Diagnosis not present

## 2016-06-22 DIAGNOSIS — S72102D Unspecified trochanteric fracture of left femur, subsequent encounter for closed fracture with routine healing: Secondary | ICD-10-CM | POA: Diagnosis not present

## 2016-06-22 DIAGNOSIS — R41841 Cognitive communication deficit: Secondary | ICD-10-CM | POA: Diagnosis not present

## 2016-06-22 DIAGNOSIS — M810 Age-related osteoporosis without current pathological fracture: Secondary | ICD-10-CM | POA: Diagnosis not present

## 2016-06-22 DIAGNOSIS — M159 Polyosteoarthritis, unspecified: Secondary | ICD-10-CM | POA: Diagnosis not present

## 2016-06-22 DIAGNOSIS — S7225XD Nondisplaced subtrochanteric fracture of left femur, subsequent encounter for closed fracture with routine healing: Secondary | ICD-10-CM | POA: Diagnosis not present

## 2016-06-22 DIAGNOSIS — G8911 Acute pain due to trauma: Secondary | ICD-10-CM | POA: Diagnosis not present

## 2016-06-22 DIAGNOSIS — G4709 Other insomnia: Secondary | ICD-10-CM | POA: Diagnosis not present

## 2016-06-22 DIAGNOSIS — M4120 Other idiopathic scoliosis, site unspecified: Secondary | ICD-10-CM | POA: Diagnosis not present

## 2016-06-22 DIAGNOSIS — T1490 Injury, unspecified: Secondary | ICD-10-CM | POA: Diagnosis not present

## 2016-06-22 DIAGNOSIS — R278 Other lack of coordination: Secondary | ICD-10-CM | POA: Diagnosis not present

## 2016-06-22 DIAGNOSIS — R262 Difficulty in walking, not elsewhere classified: Secondary | ICD-10-CM | POA: Diagnosis not present

## 2016-06-22 DIAGNOSIS — M25552 Pain in left hip: Secondary | ICD-10-CM | POA: Diagnosis not present

## 2016-06-22 DIAGNOSIS — S7290XA Unspecified fracture of unspecified femur, initial encounter for closed fracture: Secondary | ICD-10-CM | POA: Diagnosis not present

## 2016-06-23 DIAGNOSIS — F418 Other specified anxiety disorders: Secondary | ICD-10-CM | POA: Diagnosis not present

## 2016-06-23 DIAGNOSIS — G4709 Other insomnia: Secondary | ICD-10-CM | POA: Diagnosis not present

## 2016-06-23 DIAGNOSIS — M25552 Pain in left hip: Secondary | ICD-10-CM | POA: Diagnosis not present

## 2016-06-23 DIAGNOSIS — Z471 Aftercare following joint replacement surgery: Secondary | ICD-10-CM | POA: Diagnosis not present

## 2016-07-02 DIAGNOSIS — S7225XD Nondisplaced subtrochanteric fracture of left femur, subsequent encounter for closed fracture with routine healing: Secondary | ICD-10-CM | POA: Diagnosis not present

## 2016-07-07 DIAGNOSIS — M25552 Pain in left hip: Secondary | ICD-10-CM | POA: Diagnosis not present

## 2016-07-07 DIAGNOSIS — Z471 Aftercare following joint replacement surgery: Secondary | ICD-10-CM | POA: Diagnosis not present

## 2016-07-07 DIAGNOSIS — D649 Anemia, unspecified: Secondary | ICD-10-CM | POA: Diagnosis not present

## 2016-07-10 DIAGNOSIS — M159 Polyosteoarthritis, unspecified: Secondary | ICD-10-CM | POA: Diagnosis not present

## 2016-07-10 DIAGNOSIS — Z471 Aftercare following joint replacement surgery: Secondary | ICD-10-CM | POA: Diagnosis not present

## 2016-07-10 DIAGNOSIS — G4709 Other insomnia: Secondary | ICD-10-CM | POA: Diagnosis not present

## 2016-07-10 DIAGNOSIS — F418 Other specified anxiety disorders: Secondary | ICD-10-CM | POA: Diagnosis not present

## 2016-07-13 DIAGNOSIS — F418 Other specified anxiety disorders: Secondary | ICD-10-CM | POA: Diagnosis not present

## 2016-07-13 DIAGNOSIS — W109XXD Fall (on) (from) unspecified stairs and steps, subsequent encounter: Secondary | ICD-10-CM | POA: Diagnosis not present

## 2016-07-13 DIAGNOSIS — S72002D Fracture of unspecified part of neck of left femur, subsequent encounter for closed fracture with routine healing: Secondary | ICD-10-CM | POA: Diagnosis not present

## 2016-07-13 DIAGNOSIS — M1990 Unspecified osteoarthritis, unspecified site: Secondary | ICD-10-CM | POA: Diagnosis not present

## 2016-07-13 DIAGNOSIS — M419 Scoliosis, unspecified: Secondary | ICD-10-CM | POA: Diagnosis not present

## 2016-07-13 DIAGNOSIS — M810 Age-related osteoporosis without current pathological fracture: Secondary | ICD-10-CM | POA: Diagnosis not present

## 2016-07-23 ENCOUNTER — Encounter: Payer: Self-pay | Admitting: Cardiovascular Disease

## 2016-07-23 ENCOUNTER — Ambulatory Visit (INDEPENDENT_AMBULATORY_CARE_PROVIDER_SITE_OTHER): Payer: Commercial Managed Care - HMO | Admitting: Cardiovascular Disease

## 2016-07-23 ENCOUNTER — Encounter (INDEPENDENT_AMBULATORY_CARE_PROVIDER_SITE_OTHER): Payer: Self-pay

## 2016-07-23 VITALS — BP 118/66 | HR 60 | Ht 62.0 in | Wt 148.0 lb

## 2016-07-23 DIAGNOSIS — I471 Supraventricular tachycardia: Secondary | ICD-10-CM | POA: Diagnosis not present

## 2016-07-23 DIAGNOSIS — I495 Sick sinus syndrome: Secondary | ICD-10-CM | POA: Diagnosis not present

## 2016-07-23 DIAGNOSIS — Z950 Presence of cardiac pacemaker: Secondary | ICD-10-CM | POA: Diagnosis not present

## 2016-07-23 NOTE — Patient Instructions (Signed)
Dr Sallyanne Kuster recommends that you continue on your current medications as directed. Please refer to the Current Medication list given to you today.  Remote monitoring is used to monitor your Pacemaker of ICD from home. This monitoring reduces the number of office visits required to check your device to one time per year. It allows [place] to keep an eye on the functioning of your device to ensure it is working properly. You are scheduled for a device check from home on Wednesday, November 8th, 2017. You may send your transmission at any time that day. If you have a wireless device, the transmission will be sent automatically. After your physician reviews your transmission, you will receive a postcard with your next transmission date.  Dr Sallyanne Kuster recommends that you schedule a follow-up appointment in 12 months with a pacemaker check. You will receive a reminder letter in the mail two months in advance. If you don't receive a letter, please call our office to schedule the follow-up appointment.  If you need a refill on your cardiac medications before your next appointment, please call your pharmacy.
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
## 2016-07-25 DIAGNOSIS — E663 Overweight: Secondary | ICD-10-CM | POA: Diagnosis not present

## 2016-07-25 DIAGNOSIS — Z6825 Body mass index (BMI) 25.0-25.9, adult: Secondary | ICD-10-CM | POA: Diagnosis not present

## 2016-07-25 DIAGNOSIS — S72002A Fracture of unspecified part of neck of left femur, initial encounter for closed fracture: Secondary | ICD-10-CM | POA: Diagnosis not present

## 2016-07-25 DIAGNOSIS — R252 Cramp and spasm: Secondary | ICD-10-CM | POA: Diagnosis not present

## 2016-07-25 DIAGNOSIS — K219 Gastro-esophageal reflux disease without esophagitis: Secondary | ICD-10-CM | POA: Diagnosis not present

## 2016-07-25 DIAGNOSIS — M1991 Primary osteoarthritis, unspecified site: Secondary | ICD-10-CM | POA: Diagnosis not present

## 2016-07-25 DIAGNOSIS — G4709 Other insomnia: Secondary | ICD-10-CM | POA: Diagnosis not present

## 2016-07-25 DIAGNOSIS — E063 Autoimmune thyroiditis: Secondary | ICD-10-CM | POA: Diagnosis not present

## 2016-07-29 LAB — CUP PACEART INCLINIC DEVICE CHECK
Date Time Interrogation Session: 20170815093025
Implantable Lead Implant Date: 20120802
Implantable Lead Implant Date: 20120802
Implantable Lead Location: 753859
Implantable Lead Location: 753860
Lead Channel Setting Pacing Amplitude: 1.25 V
Lead Channel Setting Pacing Amplitude: 1.625
Lead Channel Setting Pacing Pulse Width: 0.5 ms
Lead Channel Setting Sensing Sensitivity: 1 mV
Pulse Gen Model: 2210
Pulse Gen Serial Number: 7250678

## 2016-08-01 ENCOUNTER — Encounter: Payer: Self-pay | Admitting: Cardiovascular Disease

## 2016-08-05 ENCOUNTER — Telehealth: Payer: Self-pay | Admitting: Orthopedic Surgery

## 2016-08-05 DIAGNOSIS — E039 Hypothyroidism, unspecified: Secondary | ICD-10-CM | POA: Diagnosis not present

## 2016-08-05 DIAGNOSIS — E782 Mixed hyperlipidemia: Secondary | ICD-10-CM | POA: Diagnosis not present

## 2016-08-05 DIAGNOSIS — Z0000 Encounter for general adult medical examination without abnormal findings: Secondary | ICD-10-CM | POA: Diagnosis not present

## 2016-08-05 DIAGNOSIS — Z6826 Body mass index (BMI) 26.0-26.9, adult: Secondary | ICD-10-CM | POA: Diagnosis not present

## 2016-08-05 DIAGNOSIS — Z1389 Encounter for screening for other disorder: Secondary | ICD-10-CM | POA: Diagnosis not present

## 2016-08-05 DIAGNOSIS — E559 Vitamin D deficiency, unspecified: Secondary | ICD-10-CM | POA: Diagnosis not present

## 2016-08-05 DIAGNOSIS — E663 Overweight: Secondary | ICD-10-CM | POA: Diagnosis not present

## 2016-08-05 NOTE — Telephone Encounter (Signed)
Regarding appointment for this patient per referral from Dr Gerarda Fraction, [place], for post-operative hip fracture done 06/18/16 at [facility], status "Approved" per Dr Ruthe Mannan review 08/05/16.  Upon scheduling patient for approved date 08/13/16 for evaluation and Xrays, open slots are now filled.  Please advise.

## 2016-08-06 NOTE — Telephone Encounter (Signed)
Okay go to the next open appt

## 2016-08-07 NOTE — Telephone Encounter (Signed)
As of 08/06/16, appointment scheduled per Rhae Hammock; patient and referring primary care, Dr Gerarda Fraction, notified.

## 2016-08-13 ENCOUNTER — Ambulatory Visit (INDEPENDENT_AMBULATORY_CARE_PROVIDER_SITE_OTHER): Payer: Commercial Managed Care - HMO

## 2016-08-13 ENCOUNTER — Ambulatory Visit (INDEPENDENT_AMBULATORY_CARE_PROVIDER_SITE_OTHER): Payer: Commercial Managed Care - HMO | Admitting: Orthopedic Surgery

## 2016-08-13 ENCOUNTER — Encounter: Payer: Self-pay | Admitting: Orthopedic Surgery

## 2016-08-13 VITALS — BP 138/78 | HR 60 | Ht 63.0 in | Wt 143.0 lb

## 2016-08-13 DIAGNOSIS — S72002A Fracture of unspecified part of neck of left femur, initial encounter for closed fracture: Secondary | ICD-10-CM | POA: Diagnosis not present
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
## 2016-09-09 ENCOUNTER — Ambulatory Visit (INDEPENDENT_AMBULATORY_CARE_PROVIDER_SITE_OTHER): Payer: Commercial Managed Care - HMO | Admitting: Orthopedic Surgery

## 2016-09-09 ENCOUNTER — Ambulatory Visit (INDEPENDENT_AMBULATORY_CARE_PROVIDER_SITE_OTHER): Payer: Commercial Managed Care - HMO

## 2016-09-09 ENCOUNTER — Encounter: Payer: Self-pay | Admitting: Orthopedic Surgery

## 2016-09-09 VITALS — BP 112/66 | HR 73 | Wt 137.0 lb

## 2016-09-09 DIAGNOSIS — S72142D Displaced intertrochanteric fracture of left femur, subsequent encounter for closed fracture with routine healing: Secondary | ICD-10-CM

## 2016-09-09 NOTE — Progress Notes (Signed)
Patient ID: [name], female   DOB: 02-Oct-1951, 65 y.o.   MRN: [id number]  Chief Complaint  Patient presents with  . Follow-up    left hip fracture, DOS 06/18/16 [place]    HPI [name] is a 65 y.o. female.   HPI  She is in the postoperative period from surgery and [place] she had an internal fixation with gamma nail left hip the fractures healing well. The nails in good position. She complains of some pain along the lateral side of her hip which is expected. She also complains of bilateral buttock pain occasional pain running into her left leg status post lumbar fusion a year prior to hip fracture surgery.  Review of Systems Review of Systems  Soreness aching nothing specific  Physical Exam BP 112/66   Pulse 73   Wt 137 lb (62.1 kg)   BMI 24.27 kg/m    Gait is supported by a cane, she has excellent hip flexion against resistance  X-rays show fracture healing nailing good position  Recommend three-month follow-up x-ray.

## 2016-10-07 DIAGNOSIS — Z6827 Body mass index (BMI) 27.0-27.9, adult: Secondary | ICD-10-CM | POA: Diagnosis not present

## 2016-10-07 DIAGNOSIS — Z79891 Long term (current) use of opiate analgesic: Secondary | ICD-10-CM | POA: Diagnosis not present

## 2016-10-07 DIAGNOSIS — G894 Chronic pain syndrome: Secondary | ICD-10-CM | POA: Diagnosis not present

## 2016-10-07 DIAGNOSIS — Z23 Encounter for immunization: Secondary | ICD-10-CM | POA: Diagnosis not present

## 2016-10-07 DIAGNOSIS — G4700 Insomnia, unspecified: Secondary | ICD-10-CM | POA: Diagnosis not present

## 2016-10-22 ENCOUNTER — Encounter: Payer: Commercial Managed Care - HMO | Admitting: *Deleted

## 2016-10-22 ENCOUNTER — Telehealth: Payer: Self-pay | Admitting: Cardiology

## 2016-10-22 NOTE — Telephone Encounter (Signed)
Attempted to confirm remote transmission with pt. No answer and was unable to leave a message.   

## 2016-10-24 ENCOUNTER — Encounter: Payer: Self-pay | Admitting: Cardiology

## 2016-10-31 ENCOUNTER — Ambulatory Visit (INDEPENDENT_AMBULATORY_CARE_PROVIDER_SITE_OTHER): Payer: Commercial Managed Care - HMO | Admitting: *Deleted

## 2016-10-31 DIAGNOSIS — I495 Sick sinus syndrome: Secondary | ICD-10-CM | POA: Diagnosis not present

## 2016-11-04 DIAGNOSIS — F419 Anxiety disorder, unspecified: Secondary | ICD-10-CM | POA: Diagnosis not present

## 2016-11-04 DIAGNOSIS — M1991 Primary osteoarthritis, unspecified site: Secondary | ICD-10-CM | POA: Diagnosis not present

## 2016-11-04 DIAGNOSIS — G894 Chronic pain syndrome: Secondary | ICD-10-CM | POA: Diagnosis not present

## 2016-11-04 DIAGNOSIS — Z6827 Body mass index (BMI) 27.0-27.9, adult: Secondary | ICD-10-CM | POA: Diagnosis not present

## 2016-11-04 DIAGNOSIS — Z1389 Encounter for screening for other disorder: Secondary | ICD-10-CM | POA: Diagnosis not present

## 2016-11-04 DIAGNOSIS — Z23 Encounter for immunization: Secondary | ICD-10-CM | POA: Diagnosis not present

## 2016-11-04 DIAGNOSIS — I4891 Unspecified atrial fibrillation: Secondary | ICD-10-CM | POA: Diagnosis not present

## 2016-11-04 NOTE — Progress Notes (Signed)
Remote pacemaker transmission.   

## 2016-11-05 ENCOUNTER — Encounter: Payer: Self-pay | Admitting: Cardiology

## 2016-11-08 LAB — CUP PACEART REMOTE DEVICE CHECK
Battery Remaining Longevity: 103 mo
Battery Remaining Percentage: 81 %
Battery Voltage: 2.93 V
Brady Statistic AP VP Percent: 1 %
Brady Statistic AP VS Percent: 52 %
Brady Statistic AS VP Percent: 1 %
Brady Statistic AS VS Percent: 48 %
Brady Statistic RA Percent Paced: 52 %
Brady Statistic RV Percent Paced: 1 %
Date Time Interrogation Session: 20171117155526
Implantable Lead Implant Date: 20120802
Implantable Lead Implant Date: 20120802
Implantable Lead Location: 753859
Implantable Lead Location: 753860
Implantable Pulse Generator Implant Date: 20120802
Lead Channel Impedance Value: 450 Ohm
Lead Channel Impedance Value: 450 Ohm
Lead Channel Pacing Threshold Amplitude: 0.625 V
Lead Channel Pacing Threshold Amplitude: 1.125 V
Lead Channel Pacing Threshold Pulse Width: 0.5 ms
Lead Channel Pacing Threshold Pulse Width: 0.5 ms
Lead Channel Sensing Intrinsic Amplitude: 1.5 mV
Lead Channel Sensing Intrinsic Amplitude: 4.2 mV
Lead Channel Setting Pacing Amplitude: 1.375
Lead Channel Setting Pacing Amplitude: 1.625
Lead Channel Setting Pacing Pulse Width: 0.5 ms
Lead Channel Setting Sensing Sensitivity: 1 mV
Pulse Gen Model: 2210
Pulse Gen Serial Number: 7250678

## 2016-12-16 ENCOUNTER — Ambulatory Visit (INDEPENDENT_AMBULATORY_CARE_PROVIDER_SITE_OTHER): Payer: Commercial Managed Care - HMO

## 2016-12-16 ENCOUNTER — Ambulatory Visit (INDEPENDENT_AMBULATORY_CARE_PROVIDER_SITE_OTHER): Payer: Commercial Managed Care - HMO | Admitting: Orthopedic Surgery

## 2016-12-16 DIAGNOSIS — S72142D Displaced intertrochanteric fracture of left femur, subsequent encounter for closed fracture with routine healing: Secondary | ICD-10-CM

## 2016-12-16 NOTE — Progress Notes (Signed)
Patient ID: JOSE KAUBLE, female   DOB: 06/10/1951, 66 y.o.   MRN: [id number]  Chief Complaint  Patient presents with  . Follow-up    LEFT HIP FRACTURE, DOI 06/18/16    HPI JACOBY BEIRNE is a 66 y.o. female.   HPI  Patient had surgery in [place] had a short gamma nail placed for peritrochanteric fracture she is doing well has some occasional groin pain. She had a back fusion so she also has some occasional back pain but nothing major  Review of Systems Review of Systems   As far as spine there is no radicular symptoms  Physical Exam  No tenderness over the left or right greater trochanter. Incisions from the gamma nail 3 healed well without any abnormality. Range of motion of the hip return to normal hip is stable hip flexion strength is normal. Distal pulses are intact sensation is normal she is walking without a cane and no significant limping is noted   MEDICAL DECISION MAKING  DATA   X-rays 2 views left hip fracture looks healed with some iliopsoas ossification in the muscle  DIAGNOSIS  Encounter Diagnosis  Name Primary?  . Intertrochanteric fracture of left hip, closed, with routine healing, subsequent encounter Yes     PLAN(RISK)    Follow-up with [place] as needed related to left hip

## 2017-02-03 ENCOUNTER — Ambulatory Visit (INDEPENDENT_AMBULATORY_CARE_PROVIDER_SITE_OTHER): Payer: Medicare HMO | Admitting: *Deleted

## 2017-02-03 DIAGNOSIS — I495 Sick sinus syndrome: Secondary | ICD-10-CM

## 2017-02-03 NOTE — Progress Notes (Signed)
Remote pacemaker transmission.   

## 2017-02-04 ENCOUNTER — Encounter: Payer: Self-pay | Admitting: Cardiology

## 2017-02-04 LAB — CUP PACEART REMOTE DEVICE CHECK
Battery Remaining Longevity: 92 mo
Battery Remaining Percentage: 73 %
Battery Voltage: 2.92 V
Brady Statistic AP VP Percent: 1 %
Brady Statistic AP VS Percent: 50 %
Brady Statistic AS VP Percent: 1 %
Brady Statistic AS VS Percent: 50 %
Brady Statistic RA Percent Paced: 50 %
Brady Statistic RV Percent Paced: 1 %
Date Time Interrogation Session: 20180220072718
Implantable Lead Implant Date: 20120802
Implantable Lead Implant Date: 20120802
Implantable Lead Location: 753859
Implantable Lead Location: 753860
Implantable Pulse Generator Implant Date: 20120802
Lead Channel Impedance Value: 450 Ohm
Lead Channel Impedance Value: 450 Ohm
Lead Channel Pacing Threshold Amplitude: 0.625 V
Lead Channel Pacing Threshold Amplitude: 1.125 V
Lead Channel Pacing Threshold Pulse Width: 0.5 ms
Lead Channel Pacing Threshold Pulse Width: 0.5 ms
Lead Channel Sensing Intrinsic Amplitude: 2.1 mV
Lead Channel Sensing Intrinsic Amplitude: 4.2 mV
Lead Channel Setting Pacing Amplitude: 1.375
Lead Channel Setting Pacing Amplitude: 1.625
Lead Channel Setting Pacing Pulse Width: 0.5 ms
Lead Channel Setting Sensing Sensitivity: 1 mV
Pulse Gen Model: 2210
Pulse Gen Serial Number: 7250678

## 2017-04-08 DIAGNOSIS — N951 Menopausal and female climacteric states: Secondary | ICD-10-CM | POA: Diagnosis not present

## 2017-04-08 DIAGNOSIS — F419 Anxiety disorder, unspecified: Secondary | ICD-10-CM | POA: Diagnosis not present

## 2017-04-08 DIAGNOSIS — M2550 Pain in unspecified joint: Secondary | ICD-10-CM | POA: Diagnosis not present

## 2017-04-08 DIAGNOSIS — M1991 Primary osteoarthritis, unspecified site: Secondary | ICD-10-CM | POA: Diagnosis not present

## 2017-04-08 DIAGNOSIS — G894 Chronic pain syndrome: Secondary | ICD-10-CM | POA: Diagnosis not present

## 2017-04-08 DIAGNOSIS — M810 Age-related osteoporosis without current pathological fracture: Secondary | ICD-10-CM | POA: Diagnosis not present

## 2017-04-08 DIAGNOSIS — Z6826 Body mass index (BMI) 26.0-26.9, adult: Secondary | ICD-10-CM | POA: Diagnosis not present

## 2017-04-08 DIAGNOSIS — E063 Autoimmune thyroiditis: Secondary | ICD-10-CM | POA: Diagnosis not present

## 2017-04-08 DIAGNOSIS — E663 Overweight: Secondary | ICD-10-CM | POA: Diagnosis not present

## 2017-05-05 ENCOUNTER — Ambulatory Visit (INDEPENDENT_AMBULATORY_CARE_PROVIDER_SITE_OTHER): Payer: Medicare HMO | Admitting: *Deleted

## 2017-05-05 DIAGNOSIS — I495 Sick sinus syndrome: Secondary | ICD-10-CM | POA: Diagnosis not present

## 2017-05-05 NOTE — Progress Notes (Signed)
Remote pacemaker transmission.   

## 2017-05-06 ENCOUNTER — Encounter: Payer: Self-pay | Admitting: Cardiology

## 2017-05-07 LAB — CUP PACEART REMOTE DEVICE CHECK
Battery Remaining Longevity: 92 mo
Battery Remaining Percentage: 73 %
Battery Voltage: 2.92 V
Brady Statistic AP VP Percent: 1 %
Brady Statistic AP VS Percent: 49 %
Brady Statistic AS VP Percent: 1 %
Brady Statistic AS VS Percent: 51 %
Brady Statistic RA Percent Paced: 49 %
Brady Statistic RV Percent Paced: 1 %
Date Time Interrogation Session: 20180522075755
Implantable Lead Implant Date: 20120802
Implantable Lead Implant Date: 20120802
Implantable Lead Location: 753859
Implantable Lead Location: 753860
Implantable Pulse Generator Implant Date: 20120802
Lead Channel Impedance Value: 430 Ohm
Lead Channel Impedance Value: 450 Ohm
Lead Channel Pacing Threshold Amplitude: 0.625 V
Lead Channel Pacing Threshold Amplitude: 1 V
Lead Channel Pacing Threshold Pulse Width: 0.5 ms
Lead Channel Pacing Threshold Pulse Width: 0.5 ms
Lead Channel Sensing Intrinsic Amplitude: 2.9 mV
Lead Channel Sensing Intrinsic Amplitude: 4.1 mV
Lead Channel Setting Pacing Amplitude: 1.25 V
Lead Channel Setting Pacing Amplitude: 1.625
Lead Channel Setting Pacing Pulse Width: 0.5 ms
Lead Channel Setting Sensing Sensitivity: 1 mV
Pulse Gen Model: 2210
Pulse Gen Serial Number: 7250678

## 2017-07-01 ENCOUNTER — Other Ambulatory Visit (HOSPITAL_COMMUNITY): Payer: Self-pay | Admitting: Internal Medicine

## 2017-07-01 DIAGNOSIS — Z1231 Encounter for screening mammogram for malignant neoplasm of breast: Secondary | ICD-10-CM

## 2017-07-02 ENCOUNTER — Ambulatory Visit (HOSPITAL_COMMUNITY)
Admission: RE | Admit: 2017-07-02 | Discharge: 2017-07-02 | Disposition: A | Discharge status: Home / Self Care | Payer: Medicare HMO | Source: Ambulatory Visit | Attending: Internal Medicine | Admitting: Internal Medicine

## 2017-07-02 DIAGNOSIS — Z1231 Encounter for screening mammogram for malignant neoplasm of breast: Secondary | ICD-10-CM | POA: Diagnosis not present

## 2017-07-22 DIAGNOSIS — F419 Anxiety disorder, unspecified: Secondary | ICD-10-CM | POA: Diagnosis not present

## 2017-07-22 DIAGNOSIS — E559 Vitamin D deficiency, unspecified: Secondary | ICD-10-CM | POA: Diagnosis not present

## 2017-07-22 DIAGNOSIS — E039 Hypothyroidism, unspecified: Secondary | ICD-10-CM | POA: Diagnosis not present

## 2017-07-25 ENCOUNTER — Emergency Department (HOSPITAL_COMMUNITY): Payer: Medicare HMO

## 2017-07-25 ENCOUNTER — Emergency Department (HOSPITAL_COMMUNITY)
Admission: EM | Admit: 2017-07-25 | Discharge: 2017-07-25 | Disposition: A | Discharge status: Home / Self Care | Payer: Medicare HMO | Attending: Emergency Medicine | Admitting: Emergency Medicine

## 2017-07-25 ENCOUNTER — Encounter (HOSPITAL_COMMUNITY): Payer: Self-pay | Admitting: Emergency Medicine

## 2017-07-25 DIAGNOSIS — Z7982 Long term (current) use of aspirin: Secondary | ICD-10-CM | POA: Insufficient documentation

## 2017-07-25 DIAGNOSIS — R1084 Generalized abdominal pain: Secondary | ICD-10-CM | POA: Diagnosis present

## 2017-07-25 DIAGNOSIS — K5793 Diverticulitis of intestine, part unspecified, without perforation or abscess with bleeding: Secondary | ICD-10-CM | POA: Insufficient documentation

## 2017-07-25 DIAGNOSIS — K5732 Diverticulitis of large intestine without perforation or abscess without bleeding: Secondary | ICD-10-CM | POA: Diagnosis not present

## 2017-07-25 DIAGNOSIS — K5792 Diverticulitis of intestine, part unspecified, without perforation or abscess without bleeding: Secondary | ICD-10-CM | POA: Diagnosis not present

## 2017-07-25 DIAGNOSIS — Z79899 Other long term (current) drug therapy: Secondary | ICD-10-CM | POA: Diagnosis not present

## 2017-07-25 DIAGNOSIS — Z950 Presence of cardiac pacemaker: Secondary | ICD-10-CM | POA: Diagnosis not present

## 2017-07-25 LAB — COMPREHENSIVE METABOLIC PANEL
ALT: 16 U/L (ref 14–54)
AST: 19 U/L (ref 15–41)
Albumin: 4 g/dL (ref 3.5–5.0)
Alkaline Phosphatase: 79 U/L (ref 38–126)
Anion gap: 13 (ref 5–15)
BUN: 9 mg/dL (ref 6–20)
CO2: 26 mmol/L (ref 22–32)
Calcium: 9.1 mg/dL (ref 8.9–10.3)
Chloride: 99 mmol/L — ABNORMAL LOW (ref 101–111)
Creatinine, Ser: 0.84 mg/dL (ref 0.44–1.00)
GFR calc Af Amer: >60 mL/min (ref >60)
GFR calc non Af Amer: >60 mL/min (ref >60)
Glucose, Bld: 103 mg/dL — ABNORMAL HIGH (ref 65–99)
Potassium: 4.2 mmol/L (ref 3.5–5.1)
Sodium: 138 mmol/L (ref 135–145)
Total Bilirubin: 0.6 mg/dL (ref 0.3–1.2)
Total Protein: 7.2 g/dL (ref 6.5–8.1)

## 2017-07-25 LAB — URINALYSIS, ROUTINE W REFLEX MICROSCOPIC
Bilirubin Urine: NEGATIVE
Glucose, UA: NEGATIVE mg/dL
Ketones, ur: NEGATIVE mg/dL
Leukocytes, UA: NEGATIVE
Nitrite: NEGATIVE
Protein, ur: NEGATIVE mg/dL
Specific Gravity, Urine: 1.013 (ref 1.005–1.030)
pH: 6 (ref 5.0–8.0)

## 2017-07-25 LAB — CBC
HCT: 40.8 % (ref 36.0–46.0)
Hemoglobin: 14 g/dL (ref 12.0–15.0)
MCH: 32.6 pg (ref 26.0–34.0)
MCHC: 34.3 g/dL (ref 30.0–36.0)
MCV: 94.9 fL (ref 78.0–100.0)
Platelets: 238 10*3/uL (ref 150–400)
RBC: 4.3 MIL/uL (ref 3.87–5.11)
RDW: 12.1 % (ref 11.5–15.5)
WBC: 9.6 10*3/uL (ref 4.0–10.5)

## 2017-07-25 LAB — LIPASE, BLOOD: Lipase: 22 U/L (ref 11–51)

## 2017-07-25 MED ORDER — SODIUM CHLORIDE 0.9 % IV BOLUS (SEPSIS)
1000.0000 mL | Freq: Once | INTRAVENOUS | Status: AC
  Administered 2017-07-25: 1000 mL via INTRAVENOUS

## 2017-07-25 MED ORDER — ONDANSETRON HCL 4 MG/2ML IJ SOLN
4.0000 mg | Freq: Once | INTRAMUSCULAR | Status: AC
  Administered 2017-07-25: 4 mg via INTRAVENOUS
  Filled 2017-07-25: qty 2

## 2017-07-25 MED ORDER — CIPROFLOXACIN HCL 250 MG PO TABS
500.0000 mg | ORAL_TABLET | Freq: Once | ORAL | Status: AC
  Administered 2017-07-25: 500 mg via ORAL
  Filled 2017-07-25: qty 2

## 2017-07-25 MED ORDER — METRONIDAZOLE 500 MG PO TABS
500.0000 mg | ORAL_TABLET | Freq: Once | ORAL | Status: AC
  Administered 2017-07-25: 500 mg via ORAL
  Filled 2017-07-25: qty 1

## 2017-07-25 MED ORDER — HYDROCODONE-ACETAMINOPHEN 5-325 MG PO TABS: 1.0000 | ORAL_TABLET | ORAL | 0 refills | Status: DC | PRN

## 2017-07-25 MED ORDER — METRONIDAZOLE 500 MG PO TABS: 500.0000 mg | ORAL_TABLET | Freq: Two times a day (BID) | ORAL | 0 refills | Status: DC

## 2017-07-25 MED ORDER — FENTANYL CITRATE (PF) 100 MCG/2ML IJ SOLN
25.0000 ug | Freq: Once | INTRAMUSCULAR | Status: AC
  Administered 2017-07-25: 25 ug via INTRAVENOUS
  Filled 2017-07-25: qty 2

## 2017-07-25 MED ORDER — MORPHINE SULFATE (PF) 4 MG/ML IV SOLN
4.0000 mg | Freq: Once | INTRAVENOUS | Status: AC
  Administered 2017-07-25: 4 mg via INTRAVENOUS
  Filled 2017-07-25: qty 1

## 2017-07-25 MED ORDER — CIPROFLOXACIN HCL 500 MG PO TABS: 500.0000 mg | ORAL_TABLET | Freq: Two times a day (BID) | ORAL | 0 refills | Status: AC

## 2017-07-25 NOTE — ED Provider Notes (Signed)
[facility] DEPT Provider Note   CSN: [id number] Arrival date & time: 07/25/17  1238     History   Chief Complaint Chief Complaint  Patient presents with  . Abdominal Pain    HPI [name] is a 66 y.o. female.   Abdominal Pain   Associated symptoms include diarrhea. Pertinent negatives include fever, nausea, vomiting and dysuria.    [name] is a 66 y.o. female who presents to the Emergency Department complaining of crampy abdominal pain and persistent diarrhea for 2 months. She describes watery brown stool multiple times throughout the day associated with diffuse pain that has been gradually worsening in severity.  She has tried [employer] without relief.  She denies fever, vomiting, melena, and recent antibiotics.  She reports hx of diverticulitis and symptoms are somewhat similar.  She has an appointment with her PCP, Dr. Gerarda Fraction for 8/26.  Also denies chest pain, shortness of breath.      Past Medical History:  Diagnosis Date  . Anxiety   . Diverticulosis of colon   . Osteoarthritis   . Osteoporosis   . Paroxysmal atrial fibrillation (HCC)    with SVT response  . Sinus node dysfunction (HCC)   . Tachy-brady syndrome [facility])     Patient Active Problem List   Diagnosis Date Noted  . Tachy-brady syndrome ([place]) 07/23/2015  . Low back pain 02/22/2014  . Rectal bleeding 07/07/2013  . Hemorrhoid 07/07/2013  . Paroxysmal SVT (supraventricular tachycardia) ([place]) 05/11/2013  . Drug-induced bradycardia 05/11/2013  . Pacemaker 05/11/2013  . Vomiting 06/03/2011  . Abdominal pain, acute, generalized 06/03/2011  . ANXIETY 09/18/2008  . HEMORRHOIDS, INTERNAL 09/18/2008  . IRRITABLE BOWEL SYNDROME 09/18/2008  . RECTAL BLEEDING 09/18/2008  . OSTEOPOROSIS 09/18/2008  . ARTERIOVENOUS MALFORMATION 09/18/2008  . WEIGHT LOSS 09/18/2008  . Diarrhea 09/18/2008    Past Surgical History:  Procedure Laterality Date  . APPENDECTOMY    . BREAST BIOPSY     right,  benign  . CERVICAL SPINE SURGERY     two  . CHOLECYSTECTOMY    . COLONOSCOPY  2007   sigmoid diverticula, few ulcerations of terminal ileum, biopsies of TI unremarkable, random colon bx neg for microscopic colitis.  . COLONOSCOPY  06/04/11   Dr. Gala Romney- pegmentation of the rectum, pancolonic diverticula, solitary ulcer in the mouth of the ileocecal valve, distal 15 cm of the terminal ileum appeared normal. Random colon biopsies were benign. Ileocecal valve ulcer or was benign without any supportive evidence of inflammatory bowel disease.  . ESOPHAGOGASTRODUODENOSCOPY  06/04/11   Dr. Vivi Ferns esophagus, deffuse petechial and gastric submucosal petechiae- benign mucousa on bx. Small bowel biopsy benign. No H. pylori.  Marland Kitchen PERMANENT PACEMAKER INSERTION  07/2011  . s/p hysterectomy    . sb capsule study  2009   Two single small nonbleeding AVMs in the  proximal small bowel, single lymphangiectasia in mid small bowel.  . vocal cord polypectomy      OB History    No data available       Home Medications    Prior to Admission medications   Medication Sig Start Date End Date Taking? Authorizing Provider  alendronate (FOSAMAX) 70 MG tablet Take 70 mg by mouth once a week. Take with a full glass of water on an empty stomach.    [provider]  ALPRAZolam Duanne Moron) 0.5 MG tablet Take 0.5 mg by mouth as needed.  04/22/11   [provider]  aspirin 81 MG tablet Take 81  mg by mouth 2 (two) times a week.     [provider]  Biotin 5000 MCG TABS Take 5,000 mcg by mouth daily.    [provider]  Calcium Carbonate-Vitamin D (OSCAL 500/200 D-3 PO) Take 2 tablets by mouth daily.     [provider]  Cholecalciferol (VITAMIN D) 2000 UNITS CAPS Take 1 capsule by mouth daily.    [provider]  Cyanocobalamin (B-12) 3000 MCG CAPS Take 3,000 mcg by mouth daily.    [provider]  FLUoxetine (PROZAC) 20 MG tablet Take 40 mg by mouth daily.      [provider]  IRON PO Take 65 mg by mouth daily.    [provider]  levothyroxine (SYNTHROID, LEVOTHROID) 25 MCG tablet Take 1 tablet by mouth daily. 07/16/15   [provider]  Multiple Vitamin (MULTIVITAMIN) tablet Take 1 tablet by mouth daily.    [provider]  Omega-3 Fatty Acids (FISH OIL) 1200 MG CAPS Take 2 capsules by mouth daily.    [provider]  Oxycodone HCl 10 MG TABS Take 1 tablet by mouth 2 (two) times daily as needed. 07/12/15   [provider]  zolpidem (AMBIEN) 10 MG tablet 10 mg at bedtime.  04/29/11   [provider]    Family History Family History  Problem Relation Age of Onset  . GI problems Sister        had colostomy (infection)  . Stroke Father 56  . Heart attack Father 70  . COPD Mother 68  . Hypertension Brother   . Diabetes Brother   . Colon cancer Neg Hx   . Inflammatory bowel disease Neg Hx     Social History Social History  Substance Use Topics  . Smoking status: Never Smoker  . Smokeless tobacco: Never Used  . Alcohol use No     Allergies   Ampicillin and Iodine   Review of Systems Review of Systems  Constitutional: Negative for appetite change, chills and fever.  HENT: Negative for sore throat.   Respiratory: Negative for shortness of breath.   Cardiovascular: Negative for chest pain.  Gastrointestinal: Positive for abdominal pain and diarrhea. Negative for blood in stool, nausea and vomiting.  Genitourinary: Negative for decreased urine volume, difficulty urinating, dysuria and flank pain.  Musculoskeletal: Negative for back pain.  Skin: Negative for color change and rash.  Neurological: Negative for dizziness, weakness and numbness.  Hematological: Negative for adenopathy.  All other systems reviewed and are negative.    Physical Exam Updated Vital Signs BP (!) 112/57 (BP Location: Right Arm)   Pulse 64   Temp 98.5 F (36.9 C) (Oral)   Resp 18   Ht 5' 2.5"  (1.588 m)   Wt 62.1 kg (137 lb)   SpO2 94%   BMI 24.66 kg/m   Physical Exam  Constitutional: She is oriented to person, place, and time. She appears well-developed and well-nourished. No distress.  HENT:  Head: Normocephalic and atraumatic.  Mouth/Throat: Oropharynx is clear and moist.  Cardiovascular: Normal rate, regular rhythm, normal heart sounds and intact distal pulses.   No murmur heard. Pulmonary/Chest: Effort normal and breath sounds normal. No respiratory distress.  Abdominal: Soft. Normal appearance and bowel sounds are normal. She exhibits no distension and no mass. There is generalized tenderness. There is no rebound, no guarding and no CVA tenderness.  Diffuse tenderness to palpation, slightly worse on left.  No guarding.  Abdomen is soft.   Musculoskeletal: Normal  range of motion. She exhibits no edema.  Neurological: She is alert and oriented to person, place, and time. She exhibits normal muscle tone. Coordination normal.  Skin: Skin is warm and dry.  Psychiatric: She has a normal mood and affect.  Nursing note and vitals reviewed.    ED Treatments / Results  Labs (all labs ordered are listed, but only abnormal results are displayed) Labs Reviewed  COMPREHENSIVE METABOLIC PANEL - Abnormal; Notable for the following:       Result Value   Chloride 99 (*)    Glucose, Bld 103 (*)    All other components within normal limits  URINALYSIS, ROUTINE W REFLEX MICROSCOPIC - Abnormal; Notable for the following:    APPearance HAZY (*)    Hgb urine dipstick SMALL (*)    Bacteria, UA RARE (*)    Squamous Epithelial / LPF 0-5 (*)    All other components within normal limits  CBC  LIPASE, BLOOD    EKG  EKG Interpretation None       Radiology No results found.  Procedures Procedures (including critical care time)  Medications Ordered in ED Medications  fentaNYL (SUBLIMAZE) injection 25 mcg (not administered)  sodium chloride 0.9 % bolus 1,000 mL (0 mLs  Intravenous Stopped 07/25/17 1540)  morphine 4 MG/ML injection 4 mg (4 mg Intravenous Given 07/25/17 1433)  ondansetron (ZOFRAN) injection 4 mg (4 mg Intravenous Given 07/25/17 1431)     Initial Impression / Assessment and Plan / ED Course  I have reviewed the triage vital signs and the nursing notes.  Pertinent labs & imaging results that were available during my care of the patient were reviewed by me and considered in my medical decision making (see chart for details).     Pt is feeling better after medications,  IVF's.  No diarrhea since ER arrival.  Has hx of diverticulitis, will CT her abdomen.    1725  End of shift, patient signed out to Evalee Jefferson, PA-C at end of shift who agrees to review CT finding and arrange dispo   Final Clinical Impressions(s) / ED Diagnoses   Final diagnoses:  None    New Prescriptions New Prescriptions   No medications on file     Kem Parkinson, Hershal Coria 07/25/17 1732    Elnora Morrison, MD 07/26/17 [phone]

## 2017-07-25 NOTE — ED Provider Notes (Signed)
Pt signed out to me by Kem Parkinson, PA-C, pending Ct imaging which is significant for distal descending and sigmoid colon diverticulitis without perforation or obvious abscess.  She was placed on cipro and flagyl, hydrcodone for pain, advised sparing use to avoid constipation, discussed increased fluid intake, clear liquids for 24 hours, then advance to low residue diet if tolerated. Discussed strict return precautions here and/or f/u with pcp.   She felt improved at time of dc.    Evalee Jefferson, PA-C 07/25/17 Pollyann Samples, MD 07/25/17 [phone]

## 2017-07-25 NOTE — ED Notes (Signed)
Warm blankets provided for pt and visitor

## 2017-07-25 NOTE — Discharge Instructions (Signed)
Use only a clear liquid diet for at least the next 24 hours.  If your symptoms are improving, you may slowly increase your diet to a low residue diet as we discussed (low fiber, no seeds or nuts).  Return here or see your doctor for any worsened symptoms.  You may take the hydrocodone prescribed for pain relief.  This will make you drowsy - do not drive within 4 hours of taking this medication.  Also be aware that this can cause constipation, so use this medicine sparingly.  You could add a stool softener such as colace to help prevent your stools from getting hard.

## 2017-07-25 NOTE — ED Notes (Signed)
Call from Radiology - CT is backed up and it will be about an hour before pt is scanned

## 2017-07-25 NOTE — ED Notes (Signed)
TT in to assess pt

## 2017-07-25 NOTE — ED Triage Notes (Addendum)
Pt reports diarrhea, abd pain, headache, weakness x2 months.

## 2017-07-25 NOTE — ED Notes (Signed)
Pt informed of wait for CT

## 2017-07-25 NOTE — ED Notes (Signed)
Pt reports abd pain and D for the last 2 months- She reports that she has an appt with Dr Gerarda Fraction on the 26 th of this month

## 2017-08-04 ENCOUNTER — Ambulatory Visit (INDEPENDENT_AMBULATORY_CARE_PROVIDER_SITE_OTHER): Payer: Medicare HMO | Admitting: *Deleted

## 2017-08-04 DIAGNOSIS — I495 Sick sinus syndrome: Secondary | ICD-10-CM

## 2017-08-05 NOTE — Progress Notes (Signed)
Remote pacemaker transmission.   

## 2017-08-07 DIAGNOSIS — K5792 Diverticulitis of intestine, part unspecified, without perforation or abscess without bleeding: Secondary | ICD-10-CM | POA: Diagnosis not present

## 2017-08-07 DIAGNOSIS — Z6826 Body mass index (BMI) 26.0-26.9, adult: Secondary | ICD-10-CM | POA: Diagnosis not present

## 2017-08-07 DIAGNOSIS — Z1389 Encounter for screening for other disorder: Secondary | ICD-10-CM | POA: Diagnosis not present

## 2017-08-07 DIAGNOSIS — E559 Vitamin D deficiency, unspecified: Secondary | ICD-10-CM | POA: Diagnosis not present

## 2017-08-07 DIAGNOSIS — G894 Chronic pain syndrome: Secondary | ICD-10-CM | POA: Diagnosis not present

## 2017-08-07 DIAGNOSIS — F419 Anxiety disorder, unspecified: Secondary | ICD-10-CM | POA: Diagnosis not present

## 2017-08-07 DIAGNOSIS — M1991 Primary osteoarthritis, unspecified site: Secondary | ICD-10-CM | POA: Diagnosis not present

## 2017-08-07 DIAGNOSIS — E663 Overweight: Secondary | ICD-10-CM | POA: Diagnosis not present

## 2017-08-12 LAB — CUP PACEART REMOTE DEVICE CHECK
Battery Remaining Longevity: 82 mo
Battery Remaining Percentage: 65 %
Battery Voltage: 2.9 V
Brady Statistic AP VP Percent: 1 %
Brady Statistic AP VS Percent: 49 %
Brady Statistic AS VP Percent: 1 %
Brady Statistic AS VS Percent: 50 %
Brady Statistic RA Percent Paced: 49 %
Brady Statistic RV Percent Paced: 1 %
Date Time Interrogation Session: 20180821062840
Implantable Lead Implant Date: 20120802
Implantable Lead Implant Date: 20120802
Implantable Lead Location: 753859
Implantable Lead Location: 753860
Implantable Pulse Generator Implant Date: 20120802
Lead Channel Impedance Value: 450 Ohm
Lead Channel Impedance Value: 450 Ohm
Lead Channel Pacing Threshold Amplitude: 0.625 V
Lead Channel Pacing Threshold Amplitude: 1.25 V
Lead Channel Pacing Threshold Pulse Width: 0.5 ms
Lead Channel Pacing Threshold Pulse Width: 0.5 ms
Lead Channel Sensing Intrinsic Amplitude: 2.7 mV
Lead Channel Sensing Intrinsic Amplitude: 4.1 mV
Lead Channel Setting Pacing Amplitude: 1.5 V
Lead Channel Setting Pacing Amplitude: 1.625
Lead Channel Setting Pacing Pulse Width: 0.5 ms
Lead Channel Setting Sensing Sensitivity: 1 mV
Pulse Gen Model: 2210
Pulse Gen Serial Number: 7250678

## 2017-08-14 ENCOUNTER — Encounter: Payer: Self-pay | Admitting: Cardiology

## 2017-10-21 ENCOUNTER — Ambulatory Visit: Payer: Medicare HMO | Admitting: Nurse Practitioner

## 2017-10-21 ENCOUNTER — Encounter: Payer: Self-pay | Admitting: Nurse Practitioner

## 2017-10-21 ENCOUNTER — Ambulatory Visit: Payer: Self-pay | Admitting: Nurse Practitioner

## 2017-10-21 ENCOUNTER — Other Ambulatory Visit: Payer: Self-pay

## 2017-10-21 VITALS — BP 115/56 | HR 83 | Temp 98.8°F

## 2017-10-21 DIAGNOSIS — K5792 Diverticulitis of intestine, part unspecified, without perforation or abscess without bleeding: Secondary | ICD-10-CM | POA: Diagnosis not present

## 2017-10-21 DIAGNOSIS — R197 Diarrhea, unspecified: Secondary | ICD-10-CM | POA: Diagnosis not present

## 2017-10-21 DIAGNOSIS — K5732 Diverticulitis of large intestine without perforation or abscess without bleeding: Secondary | ICD-10-CM

## 2017-10-21 MED ORDER — PEG 3350-KCL-NA BICARB-NACL 420 G PO SOLR: 4000.0000 mL | ORAL | 0 refills | Status: DC

## 2017-10-21 NOTE — Patient Instructions (Addendum)
1. I am recommending that you have a lab test and stool studies to help figure out your diarrhea. 2. I am giving you samples of probiotic that she can take according to the package direction.  I have enough samples to last 2 weeks.  I am providing a discount card to help with the cost of an additional 2 weeks for a total of 30 days of treatment. 3. We will provide a work note for your sister for the colonoscopy day. 4. We will schedule your colonoscopy for you. 5. Further recommendations will be made based on the results of your colonoscopy. 6. Return for follow-up in 3 months. 7. Call us if you have any questions or concerns.

## 2017-10-21 NOTE — Assessment & Plan Note (Signed)
The patient was seen in the emergency department for acute diverticulitis and treated with Cipro and Flagyl.  She states her symptoms have resolved.  Only residual symptoms are diarrhea which started shortly after her diverticulitis and is persistent today.  She has not been evaluated for her diarrhea as of yet.  Given her acute diverticulitis flare, we will plan for colonoscopy as her last colonoscopy was 5 years ago.  This will ensure there is no other confounding factors such is early CRC.  Return for follow-up in 3 months.  Proceed with TCS on propofol/MAC with Dr. Gala Romney in near future: the risks, benefits, and alternatives have been discussed with the patient in detail. The patient states understanding and desires to proceed.  The patient is currently on Xanax, Prozac, oxycodone, Ambien.  He also seems to be high anxiety.  No other anticoagulants, anxiolytics, chronic pain medications, or antidepressants.  We will plan for the procedure on propofol/MAC to promote adequate sedation.

## 2017-10-21 NOTE — Assessment & Plan Note (Signed)
Patient notes new onset of diarrhea which started about the time that she was being treated for diverticulitis.  She was treated with Cipro and Flagyl.  Unlikely C. difficile infection given these antibiotic choices, but we will check stool studies.  I will also have her start a probiotic sample in case it is antibiotic associated diarrhea.  Colonoscopy will help further evaluate as well, as per below.  Return for follow-up in 3 months.

## 2017-10-21 NOTE — Progress Notes (Signed)
CC'ED TO PCP 

## 2017-10-21 NOTE — Progress Notes (Signed)
Primary Care Physician:  Redmond School, MD Primary Gastroenterologist:  Dr. Gala Romney  Chief Complaint  Patient presents with  . Diarrhea    once daily    HPI:   [name] is a 66 y.o. female who presents follow-up on acute diverticulitis and consider scheduling repeat colonoscopy.  The patient was last seen in our office 07/07/2013 for diarrhea, rectal bleeding, hemorrhoids.  EGD and colonoscopy in 2012 found diffuse petechiae and gastric submucosal petechiae with unremarkable biopsies and small bowel negative for celiac disease.  Colonoscopy with pancolonic diverticula and a single ulcer at the ileocecal valve with a benign biopsy and 15 cm of the distal terminal ileum appearing normal.  Random colon biopsies were negative and it was felt the ulcer may be secondary to aspirin use which resolved with Entocort.  She had been doing well until a couple months prior to her previous office visit when she noted some intermittent diarrhea which is getting progressively worse as well as hematochezia.  Takes aspirin 81 mg intermittently.  No other NSAIDs.  8 pound weight loss noted.  Recommended labs and stool studies.  Anusol cream prescribed.  Bentyl was also sent.  Did not recommend repeat colonoscopy.  If no response to Bentyl, recommend treating for microscopic colitis with Uceris.  She ended up doing well on Bentyl and no colonoscopy was scheduled.  She was recently seen in the emergency department 07/25/2017 for abdominal pain.  So with persistent diarrhea for 2 months.  CBC and lipase were essentially normal.  CT found acute diverticulitis in the distal descending colon and proximal sigmoid colon.  She was prescribed Cipro and Flagyl, hydrocodone for pain, recommended increased fluid with clear liquids for 24 hours then advance to low residue as tolerated.  Strict return precautions were given.  Last colonoscopy was pleaded 06/05/2011 and results as outlined above.  Recommendation found for  repeat colonoscopy interval given no family history of colon cancer and no polyps removed, presumed 10-year repeat and she will not be due until 2022.  Today she states she's doing better. LLQ abdominal pain is better, no further pain. She has diarrhea which has been intermittent for the past few months. Stools vary from loose to diarrhea. Denies N/V, fever, chills, hematochezia, melena, unintentional weight loss. Occasional right-sided ache, thinks it may be due to having to sleep on that side all the time. Denies chest pain, dyspnea, dizziness, lightheadedness, syncope, near syncope. Denies any other upper or lower GI symptoms.  NOTE: Patient PMH and [place] incomplete in this note due to computer issues. See history tab for complete information. History tab information was reviewed with the patient and found to be correct.  Past Medical History:  Diagnosis Date  . Anxiety   . Diverticulosis of colon   . Osteoarthritis   . Osteoporosis   . Paroxysmal atrial fibrillation (HCC)    with SVT response  . Sinus node dysfunction (HCC)   . Tachy-brady syndrome [facility])     Past Surgical History:  Procedure Laterality Date  . APPENDECTOMY    . BREAST BIOPSY     right, benign  . CERVICAL SPINE SURGERY     two  . CHOLECYSTECTOMY    . COLONOSCOPY  2007   sigmoid diverticula, few ulcerations of terminal ileum, biopsies of TI unremarkable, random colon bx neg for microscopic colitis.  . COLONOSCOPY  06/04/11   Dr. Gala Romney- pegmentation of the rectum, pancolonic diverticula, solitary ulcer in the mouth of the ileocecal valve, distal  15 cm of the terminal ileum appeared normal. Random colon biopsies were benign. Ileocecal valve ulcer or was benign without any supportive evidence of inflammatory bowel disease.  . ESOPHAGOGASTRODUODENOSCOPY  06/04/11   Dr. Vivi Ferns esophagus, deffuse petechial and gastric submucosal petechiae- benign mucousa on bx. Small bowel biopsy benign. No H. pylori.  Marland Kitchen PERMANENT  PACEMAKER INSERTION  07/2011  . s/p hysterectomy    . sb capsule study  2009   Two single small nonbleeding AVMs in the  proximal small bowel, single lymphangiectasia in mid small bowel.  . vocal cord polypectomy      Current Outpatient Medications  Medication Sig Dispense Refill  . alendronate (FOSAMAX) 70 MG tablet Take 70 mg by mouth once a week. Take with a full glass of water on an empty stomach.    . ALPRAZolam (XANAX) 0.5 MG tablet Take 0.5 mg by mouth as needed.     Marland Kitchen aspirin 81 MG tablet Take 81 mg by mouth 2 (two) times a week.     . Biotin 5000 MCG TABS Take 5,000 mcg by mouth daily.    . Cholecalciferol (VITAMIN D) 2000 UNITS CAPS Take daily by mouth.     . Cyanocobalamin (B-12) 3000 MCG CAPS Take 500 mcg daily by mouth.     . estradiol (ESTRACE) 0.5 MG tablet Take 0.5 mg daily by mouth.    Marland Kitchen FLUoxetine (PROZAC) 20 MG tablet Take 10 mg daily by mouth.     . IRON PO Take 65 mg daily as needed by mouth.     . Multiple Vitamin (MULTIVITAMIN) tablet Take 1 tablet by mouth daily.    . Omega-3 Fatty Acids (FISH OIL) 1200 MG CAPS Take 2 capsules by mouth daily.    . Oxycodone HCl 10 MG TABS Take 1 tablet 2 (two) times daily as needed by mouth (PT is taking 15 mg daily).     Marland Kitchen zolpidem (AMBIEN) 10 MG tablet 10 mg at bedtime.     Marland Kitchen HYDROcodone-acetaminophen (NORCO/VICODIN) 5-325 MG tablet Take 1 tablet by mouth every 4 (four) hours as needed. (Patient not taking: Reported on 10/21/2017) 20 tablet 0  . levothyroxine (SYNTHROID, LEVOTHROID) 25 MCG tablet Take 1 tablet by mouth daily.    . metroNIDAZOLE (FLAGYL) 500 MG tablet Take 1 tablet (500 mg total) by mouth 2 (two) times daily. (Patient not taking: Reported on 10/21/2017) 14 tablet 0   No current facility-administered medications for this visit.     Allergies as of 10/21/2017 - Review Complete 10/21/2017  Allergen Reaction Noted  . Ampicillin    . Iodine Swelling     Family History  Problem Relation Age of Onset  . GI  problems Sister        had colostomy (infection)  . Stroke Father 48  . Heart attack Father 53  . COPD Mother 70  . Hypertension Brother   . Diabetes Brother   . Colon cancer Neg Hx   . Inflammatory bowel disease Neg Hx     Social History   Socioeconomic History  . Marital status: Divorced    Spouse name: Not on file  . Number of children: Not on file  . Years of education: Not on file  . Highest education level: Not on file  Social Needs  . Financial resource strain: Not on file  . Food insecurity - worry: Not on file  . Food insecurity - inability: Not on file  . Transportation needs - medical: Not on file  .  Transportation needs - non-medical: Not on file  Occupational History  . Occupation: [employer]  Tobacco Use  . Smoking status: Never Smoker  . Smokeless tobacco: Never Used  Substance and Sexual Activity  . Alcohol use: No  . Drug use: No  . Sexual activity: Not on file  Other Topics Concern  . Not on file  Social History Narrative   Has one son    Review of Systems: General: Negative for anorexia, weight loss, fever, chills, fatigue, weakness. ENT: Negative for hoarseness, difficulty swallowing. CV: Negative for chest pain, angina, palpitations, peripheral edema.  Respiratory: Negative for dyspnea at rest, cough, sputum, wheezing.  GI: See history of present illness. MS: Chronic hip pain.  Derm: Negative for rash or itching.  Endo: Negative for unusual weight change.  Heme: Negative for bruising or bleeding. Allergy: Negative for rash or hives.    Physical Exam: BP (!) 115/56   Pulse 83   Temp 98.8 F (37.1 C) (Oral)  General:   Alert and oriented. Pleasant and cooperative. Well-nourished and well-developed.  Eyes:  Without icterus, sclera clear and conjunctiva pink.  Ears:  Normal auditory acuity. Cardiovascular:  S1, S2 present without murmurs appreciated. Extremities without clubbing or edema. Respiratory:  Clear to auscultation bilaterally. No  wheezes, rales, or rhonchi. No distress.  Gastrointestinal:  +BS, soft, non-tender and non-distended. No HSM noted. No guarding or rebound. No masses appreciated.  Rectal:  Deferred  Musculoskalatal:  Symmetrical without gross deformities. Neurologic:  Alert and oriented x4;  grossly normal neurologically. Psych:  Alert and cooperative. Normal mood and affect. Heme/Lymph/Immune: No excessive bruising noted.    10/21/2017 11:20 AM   Disclaimer: This note was dictated with voice recognition software. Similar sounding words can inadvertently be transcribed and may not be corrected upon review.

## 2017-10-22 ENCOUNTER — Telehealth: Payer: Self-pay

## 2017-10-22 DIAGNOSIS — R197 Diarrhea, unspecified: Secondary | ICD-10-CM | POA: Diagnosis not present

## 2017-10-22 DIAGNOSIS — K5792 Diverticulitis of intestine, part unspecified, without perforation or abscess without bleeding: Secondary | ICD-10-CM | POA: Diagnosis not present

## 2017-10-22 NOTE — Telephone Encounter (Signed)
Tried to call pt to inform of pre-op appt 12/09/17 at 10:00am, no answer, LMOAM. Letter mailed.

## 2017-10-23 LAB — CLOSTRIDIUM DIFFICILE BY PCR: Toxigenic C. Difficile by PCR: NOT DETECTED

## 2017-10-27 DIAGNOSIS — G4700 Insomnia, unspecified: Secondary | ICD-10-CM | POA: Diagnosis not present

## 2017-10-27 DIAGNOSIS — Z6826 Body mass index (BMI) 26.0-26.9, adult: Secondary | ICD-10-CM | POA: Diagnosis not present

## 2017-10-27 DIAGNOSIS — E663 Overweight: Secondary | ICD-10-CM | POA: Diagnosis not present

## 2017-10-27 DIAGNOSIS — Z23 Encounter for immunization: Secondary | ICD-10-CM | POA: Diagnosis not present

## 2017-10-27 DIAGNOSIS — Z0001 Encounter for general adult medical examination with abnormal findings: Secondary | ICD-10-CM | POA: Diagnosis not present

## 2017-10-27 DIAGNOSIS — F419 Anxiety disorder, unspecified: Secondary | ICD-10-CM | POA: Diagnosis not present

## 2017-10-27 DIAGNOSIS — G894 Chronic pain syndrome: Secondary | ICD-10-CM | POA: Diagnosis not present

## 2017-10-28 DIAGNOSIS — Z1389 Encounter for screening for other disorder: Secondary | ICD-10-CM | POA: Diagnosis not present

## 2017-10-28 DIAGNOSIS — Z0001 Encounter for general adult medical examination with abnormal findings: Secondary | ICD-10-CM | POA: Diagnosis not present

## 2017-10-28 DIAGNOSIS — Z23 Encounter for immunization: Secondary | ICD-10-CM | POA: Diagnosis not present

## 2017-10-28 DIAGNOSIS — E782 Mixed hyperlipidemia: Secondary | ICD-10-CM | POA: Diagnosis not present

## 2017-10-28 DIAGNOSIS — E663 Overweight: Secondary | ICD-10-CM | POA: Diagnosis not present

## 2017-10-29 LAB — GASTROINTESTINAL PATHOGEN PANEL PCR
C. difficile Tox A/B, PCR: NOT DETECTED
Campylobacter, PCR: NOT DETECTED
Cryptosporidium, PCR: NOT DETECTED
E coli (ETEC) LT/ST PCR: NOT DETECTED
E coli (STEC) stx1/stx2, PCR: NOT DETECTED
E coli 0157, PCR: NOT DETECTED
Giardia lamblia, PCR: NOT DETECTED
Norovirus, PCR: NOT DETECTED
Rotavirus A, PCR: NOT DETECTED
Salmonella, PCR: NOT DETECTED
Shigella, PCR: NOT DETECTED

## 2017-10-29 LAB — CBC WITH DIFFERENTIAL/PLATELET
Basophils Absolute: 83 cells/uL (ref 0–200)
Basophils Relative: 1.2 %
Eosinophils Absolute: 104 cells/uL (ref 15–500)
Eosinophils Relative: 1.5 %
HCT: 40.1 % (ref 35.0–45.0)
Hemoglobin: 13.7 g/dL (ref 11.7–15.5)
Lymphs Abs: 1484 cells/uL (ref 850–3900)
MCH: 32.2 pg (ref 27.0–33.0)
MCHC: 34.2 g/dL (ref 32.0–36.0)
MCV: 94.1 fL (ref 80.0–100.0)
MPV: 9.5 fL (ref 7.5–12.5)
Monocytes Relative: 6.4 %
Neutro Abs: 4789 cells/uL (ref 1500–7800)
Neutrophils Relative %: 69.4 %
Platelets: 236 10*3/uL (ref 140–400)
RBC: 4.26 10*6/uL (ref 3.80–5.10)
RDW: 11.7 % (ref 11.0–15.0)
Total Lymphocyte: 21.5 %
WBC mixed population: 442 cells/uL (ref 200–950)
WBC: 6.9 10*3/uL (ref 3.8–10.8)

## 2017-11-03 ENCOUNTER — Ambulatory Visit: Payer: Medicare HMO | Admitting: *Deleted

## 2017-11-03 ENCOUNTER — Telehealth: Payer: Self-pay | Admitting: Cardiology

## 2017-11-03 NOTE — Telephone Encounter (Signed)
LMOVM reminding pt to send remote transmission.   

## 2017-11-04 ENCOUNTER — Telehealth: Payer: Self-pay | Admitting: *Deleted

## 2017-11-04 NOTE — Telephone Encounter (Addendum)
LMOVM--need to r/s TCS that is scheduled for 12/14/17 as RMR is not in that day.

## 2017-11-08 LAB — CUP PACEART REMOTE DEVICE CHECK
Battery Remaining Longevity: 83 mo
Battery Remaining Percentage: 65 %
Battery Voltage: 2.9 V
Brady Statistic AP VP Percent: 1 %
Brady Statistic AP VS Percent: 49 %
Brady Statistic AS VP Percent: 1 %
Brady Statistic AS VS Percent: 50 %
Brady Statistic RA Percent Paced: 49 %
Brady Statistic RV Percent Paced: 1 %
Date Time Interrogation Session: 20181125195248
Implantable Lead Implant Date: 20120802
Implantable Lead Implant Date: 20120802
Implantable Lead Location: 753859
Implantable Lead Location: 753860
Implantable Pulse Generator Implant Date: 20120802
Lead Channel Impedance Value: 450 Ohm
Lead Channel Impedance Value: 450 Ohm
Lead Channel Pacing Threshold Amplitude: 0.625 V
Lead Channel Pacing Threshold Amplitude: 1 V
Lead Channel Pacing Threshold Pulse Width: 0.5 ms
Lead Channel Pacing Threshold Pulse Width: 0.5 ms
Lead Channel Sensing Intrinsic Amplitude: 3.5 mV
Lead Channel Sensing Intrinsic Amplitude: 5.5 mV
Lead Channel Setting Pacing Amplitude: 1.25 V
Lead Channel Setting Pacing Amplitude: 1.625
Lead Channel Setting Pacing Pulse Width: 0.5 ms
Lead Channel Setting Sensing Sensitivity: 1 mV
Pulse Gen Model: 2210
Pulse Gen Serial Number: 7250678

## 2017-11-09 ENCOUNTER — Encounter: Payer: Self-pay | Admitting: *Deleted

## 2017-11-09 NOTE — Telephone Encounter (Signed)
LMOVM. Letter mailed to advise to contact office to r/s tcs w/ propofol

## 2017-11-16 ENCOUNTER — Encounter: Payer: Self-pay | Admitting: Cardiovascular Disease

## 2017-11-16 ENCOUNTER — Ambulatory Visit: Payer: Medicare HMO | Admitting: Cardiovascular Disease

## 2017-11-16 VITALS — BP 108/68 | HR 64 | Ht 62.0 in | Wt 143.4 lb

## 2017-11-16 DIAGNOSIS — I471 Supraventricular tachycardia: Secondary | ICD-10-CM

## 2017-11-16 DIAGNOSIS — Z950 Presence of cardiac pacemaker: Secondary | ICD-10-CM | POA: Diagnosis not present

## 2017-11-16 DIAGNOSIS — I495 Sick sinus syndrome: Secondary | ICD-10-CM

## 2017-11-16 NOTE — Progress Notes (Signed)
Cardiology Office Note    Date:  11/17/2017   ID:  [name], DOB 1951/02/08, MRN [id number]  PCP:  Redmond School, MD  Cardiologist:   Sanda Klein, MD   Chief complaint: pacemaker check  History of Present Illness:  [name] is a 66 y.o. female with tachycardia-bradycardia syndrome (sinus bradycardia and paroxysmal atrial tachycardia) with a dual-chamber permanent pacemaker ([employer], implanted 2012) returning for pacemaker follow-up.   She has not had any serious cardiac or other health issues since her last appointment.  She has not had any new falls.  She has recovered very well from her hip surgery and is walking without assistance.  She lives independently.  The patient specifically denies any chest pain at rest exertion, dyspnea at rest or with exertion, orthopnea, paroxysmal nocturnal dyspnea, syncope, palpitations, focal neurological deficits, intermittent claudication, lower extremity edema, unexplained weight gain, cough, hemoptysis or wheezing.  Interrogation of her pacemaker today shows normal device function. The estimated generator longevity is 7 years. There is a 49% atrial pacing and <1% ventricular pacing.  She has a handful of episodes of atrial mode switch, most recently a 6-second episode in November and a 10-second episode in September, both probably representing paroxysmal atrial tachycardia.    Past Medical History:  Diagnosis Date  . Anxiety   . Diverticulosis of colon   . Osteoarthritis   . Osteoporosis   . Paroxysmal atrial fibrillation (HCC)    with SVT response  . Sinus node dysfunction (HCC)   . Tachy-brady syndrome [facility])     Past Surgical History:  Procedure Laterality Date  . APPENDECTOMY    . BREAST BIOPSY     right, benign  . CERVICAL SPINE SURGERY     two  . CHOLECYSTECTOMY    . COLONOSCOPY  2007   sigmoid diverticula, few ulcerations of terminal ileum, biopsies of TI unremarkable, random colon bx neg for  microscopic colitis.  . COLONOSCOPY  06/04/11   Dr. Gala Romney- pegmentation of the rectum, pancolonic diverticula, solitary ulcer in the mouth of the ileocecal valve, distal 15 cm of the terminal ileum appeared normal. Random colon biopsies were benign. Ileocecal valve ulcer or was benign without any supportive evidence of inflammatory bowel disease.  . ESOPHAGOGASTRODUODENOSCOPY  06/04/11   Dr. Vivi Ferns esophagus, deffuse petechial and gastric submucosal petechiae- benign mucousa on bx. Small bowel biopsy benign. No H. pylori.  Marland Kitchen PERMANENT PACEMAKER INSERTION  07/2011  . s/p hysterectomy    . sb capsule study  2009   Two single small nonbleeding AVMs in the  proximal small bowel, single lymphangiectasia in mid small bowel.  . vocal cord polypectomy      Current Medications: Outpatient Medications Prior to Visit  Medication Sig Dispense Refill  . alendronate (FOSAMAX) 70 MG tablet Take 70 mg by mouth once a week. Take with a full glass of water on an empty stomach.    . ALPRAZolam (XANAX) 0.5 MG tablet Take 0.5 mg by mouth as needed.     Marland Kitchen aspirin 81 MG tablet Take 81 mg by mouth 2 (two) times a week.     . Biotin 5000 MCG TABS Take 5,000 mcg by mouth daily.    . Cholecalciferol (VITAMIN D) 2000 UNITS CAPS Take daily by mouth.     . Cyanocobalamin (B-12) 3000 MCG CAPS Take 500 mcg daily by mouth.     Marland Kitchen FLUoxetine (PROZAC) 20 MG tablet Take 10 mg daily by mouth.     Marland Kitchen  IRON PO Take 65 mg daily as needed by mouth.     . Multiple Vitamin (MULTIVITAMIN) tablet Take 1 tablet by mouth daily.    . Omega-3 Fatty Acids (FISH OIL) 1200 MG CAPS Take 2 capsules by mouth daily.    . Oxycodone HCl 10 MG TABS Take 1 tablet 2 (two) times daily as needed by mouth (PT is taking 15 mg daily).     Marland Kitchen zolpidem (AMBIEN) 10 MG tablet 10 mg at bedtime.     Marland Kitchen estradiol (ESTRACE) 0.5 MG tablet Take 0.5 mg daily by mouth.    . polyethylene glycol-electrolytes (TRILYTE) 420 g solution Take 4,000 mLs as directed by  mouth. 4000 mL 0   No facility-administered medications prior to visit.      Allergies:   Ampicillin and Iodine   Social History   Socioeconomic History  . Marital status: Divorced    Spouse name: None  . Number of children: None  . Years of education: None  . Highest education level: None  Social Needs  . Financial resource strain: None  . Food insecurity - worry: None  . Food insecurity - inability: None  . Transportation needs - medical: None  . Transportation needs - non-medical: None  Occupational History  . Occupation: [employer]  Tobacco Use  . Smoking status: Never Smoker  . Smokeless tobacco: Never Used  Substance and Sexual Activity  . Alcohol use: No  . Drug use: No  . Sexual activity: None  Other Topics Concern  . None  Social History Narrative   Has one son     Family History:  The patient's family history includes COPD (age of onset: 69) in her mother; Diabetes in her brother; GI problems in her sister; Heart attack (age of onset: 30) in her father; Hypertension in her brother; Stroke (age of onset: 13) in her father.   ROS:   Please see the history of present illness.    ROS All other systems reviewed and are negative.   PHYSICAL EXAM:   VS:  BP 108/68   Pulse 64   Ht 5\' 2"  (1.575 m)   Wt 143 lb 6.4 oz (65 kg)   BMI 26.23 kg/m     General: Alert, oriented x3, no distress, healthy appearing left subclavian pacemaker site Head: no evidence of trauma, PERRL, EOMI, no exophtalmos or lid lag, no myxedema, no xanthelasma; normal ears, nose and oropharynx Neck: normal jugular venous pulsations and no hepatojugular reflux; brisk carotid pulses without delay and no carotid bruits Chest: clear to auscultation, no signs of consolidation by percussion or palpation, normal fremitus, symmetrical and full respiratory excursions Cardiovascular: normal position and quality of the apical impulse, regular rhythm, normal first and second heart sounds, no murmurs, rubs  or gallops Abdomen: no tenderness or distention, no masses by palpation, no abnormal pulsatility or arterial bruits, normal bowel sounds, no hepatosplenomegaly Extremities: no clubbing, cyanosis or edema; 2+ radial, ulnar and brachial pulses bilaterally; 2+ right femoral, posterior tibial and dorsalis pedis pulses; 2+ left femoral, posterior tibial and dorsalis pedis pulses; no subclavian or femoral bruits Neurological: grossly nonfocal Psych: Normal mood and affect   Wt Readings from Last 3 Encounters:  11/16/17 143 lb 6.4 oz (65 kg)  07/25/17 137 lb (62.1 kg)  09/09/16 137 lb (62.1 kg)      Studies/Labs Reviewed:   EKG:  EKG is ordered today.  The ekg ordered today demonstrates atrial paced ventricular sensed rhythm otherwise normal, QTC 429 ms  Recent Labs: 07/25/2017: ALT 16; BUN 9; Creatinine, Ser 0.84; Potassium 4.2; Sodium 138 10/22/2017: Hemoglobin 13.7; Platelets 236   Lipid Panel    Component Value Date/Time   CHOL 156 07/17/2011 0605   TRIG 107 07/17/2011 0605   HDL 52 07/17/2011 0605   CHOLHDL 3.0 07/17/2011 0605   VLDL 21 07/17/2011 0605   LDLCALC 83 07/17/2011 0605     ASSESSMENT:    1. Tachy-brady syndrome (HCC)   2. Paroxysmal SVT (supraventricular tachycardia) ([place])   3. Pacemaker      PLAN:  In order of problems listed above:  1. SSS: Heart rates histogram distribution suggests adequate sensor settings.  She is not on any medications with negative chronotropic effect.  Current percentage of 50% atrial pacing similar to her historical trend. 2. PAT: The episodes of brief, essentially asymptomatic and did not require specific therapy 3. PPM: Normal device function. Continue remote downloads every 3 months and office visit yearly.    Medication Adjustments/Labs and Tests Ordered: Current medicines are reviewed at length with the patient today.  Concerns regarding medicines are outlined above.  Medication changes, Labs and Tests ordered today are listed  in the Patient Instructions below. Patient Instructions  Dr Sallyanne Kuster recommends that you continue on your current medications as directed. Please refer to the Current Medication list given to you today.  Remote monitoring is used to monitor your Pacemaker or ICD from home. This monitoring reduces the number of office visits required to check your device to one time per year. It allows [place] to keep an eye on the functioning of your device to ensure it is working properly. You are scheduled for a device check from home on Monday, March 4th, 2019. You may send your transmission at any time that day. If you have a wireless device, the transmission will be sent automatically. After your physician reviews your transmission, you will receive a notification with your next transmission date.  Dr Sallyanne Kuster recommends that you schedule a follow-up appointment in 12 months with a pacemaker check. You will receive a reminder letter in the mail two months in advance. If you don't receive a letter, please call our office to schedule the follow-up appointment.  If you need a refill on your cardiac medications before your next appointment, please call your pharmacy.    Signed, Sanda Klein, MD  11/17/2017 4:41 PM    [facility] [address], [place], [place]  [address] Phone: [phone]; Fax: [phone] ,mg

## 2017-11-16 NOTE — Telephone Encounter (Signed)
Pt called office to reschedule her Colonoscopy with RMR. TCS w/Propofol scheduled for 12/31/17 at 9:30am. She already has her prep. New instructions mailed. LMOVM and informed Endo scheduler.

## 2017-11-16 NOTE — Patient Instructions (Signed)
Dr Croitoru recommends that you continue on your current medications as directed. Please refer to the Current Medication list given to you today.  Remote monitoring is used to monitor your Pacemaker or ICD from home. This monitoring reduces the number of office visits required to check your device to one time per year. It allows us to keep an eye on the functioning of your device to ensure it is working properly. You are scheduled for a device check from home on Monday, March 4th, 2019. You may send your transmission at any time that day. If you have a wireless device, the transmission will be sent automatically. After your physician reviews your transmission, you will receive a notification with your next transmission date.  Dr Croitoru recommends that you schedule a follow-up appointment in 12 months with a pacemaker check. You will receive a reminder letter in the mail two months in advance. If you don't receive a letter, please call our office to schedule the follow-up appointment.  If you need a refill on your cardiac medications before your next appointment, please call your pharmacy.  

## 2017-11-16 NOTE — Telephone Encounter (Signed)
Called and informed pt of pre-op appt 12/25/17 at 10:00am. Letter also mailed with her procedure instructions.

## 2017-11-18 NOTE — Addendum Note (Signed)
Addended by: Vennie Homans on: 11/18/2017 03:10 PM   Modules accepted: Orders

## 2017-12-01 LAB — CUP PACEART INCLINIC DEVICE CHECK
Date Time Interrogation Session: 20181218165636
Implantable Lead Implant Date: 20120802
Implantable Lead Implant Date: 20120802
Implantable Lead Location: 753859
Implantable Lead Location: 753860
Implantable Pulse Generator Implant Date: 20120802
Lead Channel Setting Pacing Amplitude: 1.25 V
Lead Channel Setting Pacing Amplitude: 1.625
Lead Channel Setting Pacing Pulse Width: 0.5 ms
Lead Channel Setting Sensing Sensitivity: 1 mV
Pulse Gen Model: 2210
Pulse Gen Serial Number: 7250678

## 2017-12-09 ENCOUNTER — Other Ambulatory Visit (HOSPITAL_COMMUNITY): Payer: Self-pay

## 2017-12-11 ENCOUNTER — Other Ambulatory Visit: Payer: Self-pay | Admitting: Cardiovascular Disease

## 2017-12-22 NOTE — Patient Instructions (Addendum)
[name]  12/22/2017     @PREFPERIOPPHARMACY @   Your procedure is scheduled on  12/31/2017   Report to [facility] at  730   A.M.  Call this number if you have problems the morning of surgery:  [phone]   Remember:  Do not eat food or drink liquids after midnight.  Take these medicines the morning of surgery with A SIP OF WATER  Xanax, prozac, oxycodone.   Do not wear jewelry, make-up or nail polish.  Do not wear lotions, powders, or perfumes, or deodorant.  Do not shave 48 hours prior to surgery.  Men may shave face and neck.  Do not bring valuables to the hospital.  [facility] is not responsible for any belongings or valuables.  Contacts, dentures or bridgework may not be worn into surgery.  Leave your suitcase in the car.  After surgery it may be brought to your room.  For patients admitted to the hospital, discharge time will be determined by your treatment team.  Patients discharged the day of surgery will not be allowed to drive home.   Name and phone number of your driver:   family Special instructions:  Follow the diet and prep instructions given to you by Dr Roseanne Kaufman office.  Please read over the following fact sheets that you were given. Anesthesia Post-op Instructions and Care and Recovery After Surgery       Colonoscopy, Adult A colonoscopy is an exam to look at the large intestine. It is done to check for problems, such as:  Lumps (tumors).  Growths (polyps).  Swelling (inflammation).  Bleeding.  What happens before the procedure? Eating and drinking Follow instructions from your doctor about eating and drinking. These instructions may include:  A few days before the procedure - follow a low-fiber diet. ? Avoid nuts. ? Avoid seeds. ? Avoid dried fruit. ? Avoid raw fruits. ? Avoid vegetables.  1-3 days before the procedure - follow a clear liquid diet. Avoid liquids that have red or purple dye. Drink only clear liquids,  such as: ? Clear broth or bouillon. ? Black coffee or tea. ? Clear juice. ? Clear soft drinks or sports drinks. ? Gelatin dessert. ? Popsicles.  On the day of the procedure - do not eat or drink anything during the 2 hours before the procedure.  Bowel prep If you were prescribed an oral bowel prep:  Take it as told by your doctor. Starting the day before your procedure, you will need to drink a lot of liquid. The liquid will cause you to poop (have bowel movements) until your poop is almost clear or light green.  If your skin or butt gets irritated from diarrhea, you may: ? Wipe the area with wipes that have medicine in them, such as adult wet wipes with aloe and vitamin E. ? Put something on your skin that soothes the area, such as petroleum jelly.  If you throw up (vomit) while drinking the bowel prep, take a break for up to 60 minutes. Then begin the bowel prep again. If you keep throwing up and you cannot take the bowel prep without throwing up, call your doctor.  General instructions  Ask your doctor about changing or stopping your normal medicines. This is important if you take diabetes medicines or blood thinners.  Plan to have someone take you home from the hospital or clinic. What happens during the procedure?  An IV tube  may be put into one of your veins.  You will be given medicine to help you relax (sedative).  To reduce your risk of infection: ? Your doctors will wash their hands. ? Your anal area will be washed with soap.  You will be asked to lie on your side with your knees bent.  Your doctor will get a long, thin, flexible tube ready. The tube will have a camera and a light on the end.  The tube will be put into your anus.  The tube will be gently put into your large intestine.  Air will be delivered into your large intestine to keep it open. You may feel some pressure or cramping.  The camera will be used to take photos.  A small tissue sample may be  removed from your body to be looked at under a microscope (biopsy). If any possible problems are found, the tissue will be sent to a lab for testing.  If small growths are found, your doctor may remove them and have them checked for cancer.  The tube that was put into your anus will be slowly removed. The procedure may vary among doctors and hospitals. What happens after the procedure?  Your doctor will check on you often until the medicines you were given have worn off.  Do not drive for 24 hours after the procedure.  You may have a small amount of blood in your poop.  You may pass gas.  You may have mild cramps or bloating in your belly (abdomen).  It is up to you to get the results of your procedure. Ask your doctor, or the department performing the procedure, when your results will be ready. This information is not intended to replace advice given to you by your health care provider. Make sure you discuss any questions you have with your health care provider. Document Released: 01/03/2011 Document Revised: 10/01/2016 Document Reviewed: 02/12/2016 Elsevier Interactive Patient Education  2017 [employer]  Colonoscopy, Adult, Care After This sheet gives you information about how to care for yourself after your procedure. Your health care provider may also give you more specific instructions. If you have problems or questions, contact your health care provider. What can I expect after the procedure? After the procedure, it is common to have:  A small amount of blood in your stool for 24 hours after the procedure.  Some gas.  Mild abdominal cramping or bloating.  Follow these instructions at home: General instructions   For the first 24 hours after the procedure: ? Do not drive or use machinery. ? Do not sign important documents. ? Do not drink alcohol. ? Do your regular daily activities at a slower pace than normal. ? Eat soft, easy-to-digest foods. ? Rest  often.  Take over-the-counter or prescription medicines only as told by your health care provider.  It is up to you to get the results of your procedure. Ask your health care provider, or the department performing the procedure, when your results will be ready. Relieving cramping and bloating  Try walking around when you have cramps or feel bloated.  Apply heat to your abdomen as told by your health care provider. Use a heat source that your health care provider recommends, such as a moist heat pack or a heating pad. ? Place a towel between your skin and the heat source. ? Leave the heat on for 20-30 minutes. ? Remove the heat if your skin turns bright red. This is especially important if  you are unable to feel pain, heat, or cold. You may have a greater risk of getting burned. Eating and drinking  Drink enough fluid to keep your urine clear or pale yellow.  Resume your normal diet as instructed by your health care provider. Avoid heavy or fried foods that are hard to digest.  Avoid drinking alcohol for as long as instructed by your health care provider. Contact a health care provider if:  You have blood in your stool 2-3 days after the procedure. Get help right away if:  You have more than a small spotting of blood in your stool.  You pass large blood clots in your stool.  Your abdomen is swollen.  You have nausea or vomiting.  You have a fever.  You have increasing abdominal pain that is not relieved with medicine. This information is not intended to replace advice given to you by your health care provider. Make sure you discuss any questions you have with your health care provider. Document Released: 07/15/2004 Document Revised: 08/25/2016 Document Reviewed: 02/12/2016 Elsevier Interactive Patient Education  2018 [place] Anesthesia is a term that refers to techniques, procedures, and medicines that help a person stay safe and comfortable  during a medical procedure. Monitored anesthesia care, or sedation, is one type of anesthesia. Your anesthesia specialist may recommend sedation if you will be having a procedure that does not require you to be unconscious, such as:  Cataract surgery.  A dental procedure.  A biopsy.  A colonoscopy.  During the procedure, you may receive a medicine to help you relax (sedative). There are three levels of sedation:  Mild sedation. At this level, you may feel awake and relaxed. You will be able to follow directions.  Moderate sedation. At this level, you will be sleepy. You may not remember the procedure.  Deep sedation. At this level, you will be asleep. You will not remember the procedure.  The more medicine you are given, the deeper your level of sedation will be. Depending on how you respond to the procedure, the anesthesia specialist may change your level of sedation or the type of anesthesia to fit your needs. An anesthesia specialist will monitor you closely during the procedure. Let your health care provider know about:  Any allergies you have.  All medicines you are taking, including vitamins, herbs, eye drops, creams, and over-the-counter medicines.  Any use of steroids (by mouth or as a cream).  Any problems you or family members have had with sedatives and anesthetic medicines.  Any blood disorders you have.  Any surgeries you have had.  Any medical conditions you have, such as sleep apnea.  Whether you are pregnant or may be pregnant.  Any use of cigarettes, alcohol, or street drugs. What are the risks? Generally, this is a safe procedure. However, problems may occur, including:  Getting too much medicine (oversedation).  Nausea.  Allergic reaction to medicines.  Trouble breathing. If this happens, a breathing tube may be used to help with breathing. It will be removed when you are awake and breathing on your own.  Heart trouble.  Lung trouble.  Before  the procedure Staying hydrated Follow instructions from your health care provider about hydration, which may include:  Up to 2 hours before the procedure - you may continue to drink clear liquids, such as water, clear fruit juice, black coffee, and plain tea.  Eating and drinking restrictions Follow instructions from your health care provider about eating and  drinking, which may include:  8 hours before the procedure - stop eating heavy meals or foods such as meat, fried foods, or fatty foods.  6 hours before the procedure - stop eating light meals or foods, such as toast or cereal.  6 hours before the procedure - stop drinking milk or drinks that contain milk.  2 hours before the procedure - stop drinking clear liquids.  Medicines Ask your health care provider about:  Changing or stopping your regular medicines. This is especially important if you are taking diabetes medicines or blood thinners.  Taking medicines such as aspirin and ibuprofen. These medicines can thin your blood. Do not take these medicines before your procedure if your health care provider instructs you not to.  Tests and exams  You will have a physical exam.  You may have blood tests done to show: ? How well your kidneys and liver are working. ? How well your blood can clot.  General instructions  Plan to have someone take you home from the hospital or clinic.  If you will be going home right after the procedure, plan to have someone with you for 24 hours.  What happens during the procedure?  Your blood pressure, heart rate, breathing, level of pain and overall condition will be monitored.  An IV tube will be inserted into one of your veins.  Your anesthesia specialist will give you medicines as needed to keep you comfortable during the procedure. This may mean changing the level of sedation.  The procedure will be performed. After the procedure  Your blood pressure, heart rate, breathing rate, and  blood oxygen level will be monitored until the medicines you were given have worn off.  Do not drive for 24 hours if you received a sedative.  You may: ? Feel sleepy, clumsy, or nauseous. ? Feel forgetful about what happened after the procedure. ? Have a sore throat if you had a breathing tube during the procedure. ? Vomit. This information is not intended to replace advice given to you by your health care provider. Make sure you discuss any questions you have with your health care provider. Document Released: 08/27/2005 Document Revised: 05/09/2016 Document Reviewed: 03/23/2016 Elsevier Interactive Patient Education  2018 [place], Care After These instructions provide you with information about caring for yourself after your procedure. Your health care provider may also give you more specific instructions. Your treatment has been planned according to current medical practices, but problems sometimes occur. Call your health care provider if you have any problems or questions after your procedure. What can I expect after the procedure? After your procedure, it is common to:  Feel sleepy for several hours.  Feel clumsy and have poor balance for several hours.  Feel forgetful about what happened after the procedure.  Have poor judgment for several hours.  Feel nauseous or vomit.  Have a sore throat if you had a breathing tube during the procedure.  Follow these instructions at home: For at least 24 hours after the procedure:   Do not: ? Participate in activities in which you could fall or become injured. ? Drive. ? Use heavy machinery. ? Drink alcohol. ? Take sleeping pills or medicines that cause drowsiness. ? Make important decisions or sign legal documents. ? Take care of children on your own.  Rest. Eating and drinking  Follow the diet that is recommended by your health care provider.  If you vomit, drink water, juice, or soup when you  can drink without vomiting.  Make sure you have little or no nausea before eating solid foods. General instructions  Have a responsible adult stay with you until you are awake and alert.  Take over-the-counter and prescription medicines only as told by your health care provider.  If you smoke, do not smoke without supervision.  Keep all follow-up visits as told by your health care provider. This is important. Contact a health care provider if:  You keep feeling nauseous or you keep vomiting.  You feel light-headed.  You develop a rash.  You have a fever. Get help right away if:  You have trouble breathing. This information is not intended to replace advice given to you by your health care provider. Make sure you discuss any questions you have with your health care provider. Document Released: 03/23/2016 Document Revised: 07/23/2016 Document Reviewed: 03/23/2016 Elsevier Interactive Patient Education  [employer].

## 2017-12-22 NOTE — Telephone Encounter (Signed)
Pt came by office. She needed letter for her sister to be out of work to take her for her colonoscopy 12/31/17 (procedure was rescheduled). Letter given.

## 2017-12-25 ENCOUNTER — Encounter (HOSPITAL_COMMUNITY)
Admission: RE | Admit: 2017-12-25 | Discharge: 2017-12-25 | Disposition: A | Discharge status: Home / Self Care | Payer: Medicare HMO | Source: Ambulatory Visit | Attending: Internal Medicine | Admitting: Internal Medicine

## 2017-12-25 ENCOUNTER — Encounter (HOSPITAL_COMMUNITY): Payer: Self-pay

## 2017-12-25 ENCOUNTER — Other Ambulatory Visit: Payer: Self-pay

## 2017-12-25 DIAGNOSIS — I471 Supraventricular tachycardia: Secondary | ICD-10-CM | POA: Diagnosis not present

## 2017-12-25 DIAGNOSIS — R197 Diarrhea, unspecified: Secondary | ICD-10-CM | POA: Diagnosis not present

## 2017-12-25 DIAGNOSIS — R634 Abnormal weight loss: Secondary | ICD-10-CM | POA: Diagnosis not present

## 2017-12-25 DIAGNOSIS — Z01812 Encounter for preprocedural laboratory examination: Secondary | ICD-10-CM | POA: Insufficient documentation

## 2017-12-25 DIAGNOSIS — Q279 Congenital malformation of peripheral vascular system, unspecified: Secondary | ICD-10-CM | POA: Diagnosis not present

## 2017-12-25 DIAGNOSIS — R1110 Vomiting, unspecified: Secondary | ICD-10-CM | POA: Insufficient documentation

## 2017-12-25 DIAGNOSIS — K648 Other hemorrhoids: Secondary | ICD-10-CM | POA: Insufficient documentation

## 2017-12-25 DIAGNOSIS — K5792 Diverticulitis of intestine, part unspecified, without perforation or abscess without bleeding: Secondary | ICD-10-CM | POA: Diagnosis not present

## 2017-12-25 DIAGNOSIS — M810 Age-related osteoporosis without current pathological fracture: Secondary | ICD-10-CM | POA: Insufficient documentation

## 2017-12-25 DIAGNOSIS — F411 Generalized anxiety disorder: Secondary | ICD-10-CM | POA: Diagnosis not present

## 2017-12-25 DIAGNOSIS — Z950 Presence of cardiac pacemaker: Secondary | ICD-10-CM | POA: Diagnosis not present

## 2017-12-25 DIAGNOSIS — K589 Irritable bowel syndrome without diarrhea: Secondary | ICD-10-CM | POA: Diagnosis not present

## 2017-12-25 DIAGNOSIS — R1084 Generalized abdominal pain: Secondary | ICD-10-CM | POA: Insufficient documentation

## 2017-12-25 DIAGNOSIS — I495 Sick sinus syndrome: Secondary | ICD-10-CM | POA: Insufficient documentation

## 2017-12-25 DIAGNOSIS — K625 Hemorrhage of anus and rectum: Secondary | ICD-10-CM | POA: Insufficient documentation

## 2017-12-25 HISTORY — DX: Personal history of urinary calculi: Z87.442

## 2017-12-25 HISTORY — DX: Cardiac arrhythmia, unspecified: I49.9

## 2017-12-25 HISTORY — DX: Presence of cardiac pacemaker: Z95.0

## 2017-12-25 LAB — CBC WITH DIFFERENTIAL/PLATELET
Basophils Absolute: 0.1 10*3/uL (ref 0.0–0.1)
Basophils Relative: 1 %
Eosinophils Absolute: 0.1 10*3/uL (ref 0.0–0.7)
Eosinophils Relative: 2 %
HCT: 38.4 % (ref 36.0–46.0)
Hemoglobin: 12.8 g/dL (ref 12.0–15.0)
Lymphocytes Relative: 30 %
Lymphs Abs: 1.6 10*3/uL (ref 0.7–4.0)
MCH: 32.5 pg (ref 26.0–34.0)
MCHC: 33.3 g/dL (ref 30.0–36.0)
MCV: 97.5 fL (ref 78.0–100.0)
Monocytes Absolute: 0.3 10*3/uL (ref 0.1–1.0)
Monocytes Relative: 6 %
Neutro Abs: 3.3 10*3/uL (ref 1.7–7.7)
Neutrophils Relative %: 61 %
Platelets: 199 10*3/uL (ref 150–400)
RBC: 3.94 MIL/uL (ref 3.87–5.11)
RDW: 12.4 % (ref 11.5–15.5)
WBC: 5.4 10*3/uL (ref 4.0–10.5)

## 2017-12-25 LAB — BASIC METABOLIC PANEL
Anion gap: 10 (ref 5–15)
BUN: 10 mg/dL (ref 6–20)
CO2: 24 mmol/L (ref 22–32)
Calcium: 8.8 mg/dL — ABNORMAL LOW (ref 8.9–10.3)
Chloride: 100 mmol/L — ABNORMAL LOW (ref 101–111)
Creatinine, Ser: 0.72 mg/dL (ref 0.44–1.00)
GFR calc Af Amer: >60 mL/min (ref >60)
GFR calc non Af Amer: >60 mL/min (ref >60)
Glucose, Bld: 93 mg/dL (ref 65–99)
Potassium: 3.8 mmol/L (ref 3.5–5.1)
Sodium: 134 mmol/L — ABNORMAL LOW (ref 135–145)

## 2017-12-30 ENCOUNTER — Telehealth: Payer: Self-pay | Admitting: *Deleted

## 2017-12-30 NOTE — Telephone Encounter (Signed)
Noted  

## 2017-12-30 NOTE — Telephone Encounter (Signed)
Patient called to cancel her TCS as she had a sudden death in the family yesterday. This is her 2nd time we have had to reschedule. Patient reports once she comes back into town she will call us to make an appointment to come back in to get her rescheduled. I called Endo and spoke with Maudie Mercury. TCS cancelled for tomorrow. FYI to [employer].

## 2017-12-31 ENCOUNTER — Encounter (HOSPITAL_COMMUNITY): Admission: RE | Payer: Self-pay | Source: Ambulatory Visit

## 2017-12-31 ENCOUNTER — Ambulatory Visit (HOSPITAL_COMMUNITY): Admission: RE | Admit: 2017-12-31 | Payer: Medicare HMO | Source: Ambulatory Visit | Admitting: Internal Medicine

## 2017-12-31 SURGERY — COLONOSCOPY WITH PROPOFOL
Anesthesia: Monitor Anesthesia Care

## 2018-01-21 ENCOUNTER — Ambulatory Visit: Payer: Medicare HMO | Admitting: Nurse Practitioner

## 2018-01-21 NOTE — Progress Notes (Deleted)
Referring Provider: Redmond School, MD Primary Care Physician:  Redmond School, MD Primary GI:  Dr. Gala Romney  No chief complaint on file.   HPI:   [name] is a 67 y.o. female who presents for follow-up on diarrhea and diverticulitis.  Patient was last seen in our office 10/21/2017 for the same.  She was also there to consider rescheduling of colonoscopy.  Had recently been seen for diarrhea, rectal bleeding, hemorrhoids.  EGD and colonoscopy was last completed 2012 which found diffuse petechiae and gastric submucosal petechiae and unremarkable biopsies and small bowel negative for celiac disease.  Colonoscopy with pancolonic diverticula and single ulcer at the ileocecal valve with a benign biopsy and 15 cm of the distal terminal ileum appearing normal.  Random colon biopsies were negative and it was felt the ulcer may be secondary to NSAID use, which resolved with Entocort.  Diagnosed with acute diverticulitis in the emergency department 11/03/2017.  After previous colonoscopy she was not given return recommendations but presumed 10-year repeat which would make her due in 2022.  At her last visit she was doing better, left lower quadrant abdominal pain better/resolved.  Diarrhea which is been intermittent with stools varying from loose to diarrhea.  No other GI symptoms.  Recommended serology and stool studies related to diarrhea.  Start probiotic.  Schedule a colonoscopy.  Follow-up in 3 months.  Colonoscopy was initially scheduled for 12/31/2017.  The day before she had to cancel due to a sudden death in the family.  This was her second cancellation.  Today she states   Past Medical History:  Diagnosis Date  . Anxiety   . Diverticulosis of colon   . Dysrhythmia    AFib  . History of kidney stones   . Osteoarthritis   . Osteoporosis   . Paroxysmal atrial fibrillation (HCC)    with SVT response  . Presence of permanent cardiac pacemaker   . Sinus node dysfunction (HCC)   .  Tachy-brady syndrome [facility])     Past Surgical History:  Procedure Laterality Date  . ABDOMINAL HYSTERECTOMY    . APPENDECTOMY    . BREAST BIOPSY     right, benign  . CERVICAL SPINE SURGERY     two  . CHOLECYSTECTOMY    . COLONOSCOPY  2007   sigmoid diverticula, few ulcerations of terminal ileum, biopsies of TI unremarkable, random colon bx neg for microscopic colitis.  . COLONOSCOPY  06/04/11   Dr. Gala Romney- pegmentation of the rectum, pancolonic diverticula, solitary ulcer in the mouth of the ileocecal valve, distal 15 cm of the terminal ileum appeared normal. Random colon biopsies were benign. Ileocecal valve ulcer or was benign without any supportive evidence of inflammatory bowel disease.  . ESOPHAGOGASTRODUODENOSCOPY  06/04/11   Dr. Vivi Ferns esophagus, deffuse petechial and gastric submucosal petechiae- benign mucousa on bx. Small bowel biopsy benign. No H. pylori.  Randolm Idol / REPLACE / REMOVE PACEMAKER    . PERMANENT PACEMAKER INSERTION  07/2011  . s/p hysterectomy    . sb capsule study  2009   Two single small nonbleeding AVMs in the  proximal small bowel, single lymphangiectasia in mid small bowel.  . vocal cord polypectomy      Current Outpatient Medications  Medication Sig Dispense Refill  . alendronate (FOSAMAX) 70 MG tablet Take 70 mg by mouth every Monday. Take with a full glass of water on an empty stomach.     . ALPRAZolam (XANAX) 0.5 MG tablet Take 0.5 mg by  mouth 2 (two) times daily as needed for anxiety.     Marland Kitchen aspirin 81 MG tablet Take 81 mg by mouth every Wednesday.     . Biotin 5000 MCG TABS Take 5,000 mcg by mouth every other day.     . cholecalciferol (VITAMIN D) 1000 units tablet Take 1,000 Units by mouth daily.     . Cyanocobalamin (B-12) 500 MCG TABS Take 500 mcg daily by mouth.     . ferrous sulfate 325 (65 FE) MG tablet Take 325 mg by mouth 2 (two) times a week.    Marland Kitchen FLUoxetine (PROZAC) 10 MG capsule Take 10 mg daily by mouth.     . Menthol, Topical  Analgesic, (BIOFREEZE EX) Apply 1 application topically daily as needed (for pain).    . Multiple Vitamins-Minerals (ALIVE WOMENS 50+ PO) Take 1 tablet by mouth daily.    . Naphazoline HCl (CLEAR EYES OP) Place 1 drop into both eyes daily as needed (for dry eyes).    . Omega-3 Fatty Acids (FISH OIL) 1200 MG CAPS Take 1,200 mg by mouth daily.     Marland Kitchen oxyCODONE (ROXICODONE) 15 MG immediate release tablet Take 15 mg by mouth 4 (four) times daily as needed (for pain).     Marland Kitchen zolpidem (AMBIEN) 10 MG tablet Take 10 mg by mouth at bedtime.      No current facility-administered medications for this visit.     Allergies as of 01/21/2018 - Review Complete 12/25/2017  Allergen Reaction Noted  . Ampicillin Other (See Comments)   . Iodine Swelling and Other (See Comments)     Family History  Problem Relation Age of Onset  . GI problems Sister        had colostomy (infection)  . Stroke Father 2  . Heart attack Father 59  . COPD Mother 28  . Hypertension Brother   . Diabetes Brother   . Colon cancer Neg Hx   . Inflammatory bowel disease Neg Hx     Social History   Socioeconomic History  . Marital status: Divorced    Spouse name: Not on file  . Number of children: Not on file  . Years of education: Not on file  . Highest education level: Not on file  Social Needs  . Financial resource strain: Not on file  . Food insecurity - worry: Not on file  . Food insecurity - inability: Not on file  . Transportation needs - medical: Not on file  . Transportation needs - non-medical: Not on file  Occupational History  . Occupation: [employer]  Tobacco Use  . Smoking status: Never Smoker  . Smokeless tobacco: Never Used  Substance and Sexual Activity  . Alcohol use: No  . Drug use: No  . Sexual activity: Not on file  Other Topics Concern  . Not on file  Social History Narrative   Has one son    Review of Systems: General: Negative for anorexia, weight loss, fever, chills, fatigue,  weakness. Eyes: Negative for vision changes.  ENT: Negative for hoarseness, difficulty swallowing , nasal congestion. CV: Negative for chest pain, angina, palpitations, dyspnea on exertion, peripheral edema.  Respiratory: Negative for dyspnea at rest, dyspnea on exertion, cough, sputum, wheezing.  GI: See history of present illness. GU:  Negative for dysuria, hematuria, urinary incontinence, urinary frequency, nocturnal urination.  MS: Negative for joint pain, low back pain.  Derm: Negative for rash or itching.  Neuro: Negative for weakness, abnormal sensation, seizure, frequent headaches, memory loss, confusion.  Psych: Negative for anxiety, depression, suicidal ideation, hallucinations.  Endo: Negative for unusual weight change.  Heme: Negative for bruising or bleeding. Allergy: Negative for rash or hives.   Physical Exam: There were no vitals taken for this visit. General:   Alert and oriented. Pleasant and cooperative. Well-nourished and well-developed.  Head:  Normocephalic and atraumatic. Eyes:  Without icterus, sclera clear and conjunctiva pink.  Ears:  Normal auditory acuity. Mouth:  No deformity or lesions, oral mucosa pink.  Throat/Neck:  Supple, without mass or thyromegaly. Cardiovascular:  S1, S2 present without murmurs appreciated. Normal pulses noted. Extremities without clubbing or edema. Respiratory:  Clear to auscultation bilaterally. No wheezes, rales, or rhonchi. No distress.  Gastrointestinal:  +BS, soft, non-tender and non-distended. No HSM noted. No guarding or rebound. No masses appreciated.  Rectal:  Deferred  Musculoskalatal:  Symmetrical without gross deformities. Normal posture. Skin:  Intact without significant lesions or rashes. Neurologic:  Alert and oriented x4;  grossly normal neurologically. Psych:  Alert and cooperative. Normal mood and affect. Heme/Lymph/Immune: No significant cervical adenopathy. No excessive bruising noted.    01/21/2018 8:33  AM   Disclaimer: This note was dictated with voice recognition software. Similar sounding words can inadvertently be transcribed and may not be corrected upon review.

## 2018-02-15 ENCOUNTER — Encounter: Payer: Medicare HMO | Admitting: *Deleted

## 2018-02-16 ENCOUNTER — Telehealth: Payer: Self-pay | Admitting: Cardiology

## 2018-02-16 NOTE — Telephone Encounter (Signed)
Attempted to confirm remote transmission with pt. No answer and was unable to leave a message.  Number busy.

## 2018-02-18 ENCOUNTER — Encounter: Payer: Self-pay | Admitting: Cardiology

## 2018-02-24 ENCOUNTER — Other Ambulatory Visit: Payer: Self-pay

## 2018-02-24 ENCOUNTER — Emergency Department (HOSPITAL_COMMUNITY): Payer: Medicare HMO

## 2018-02-24 ENCOUNTER — Emergency Department (HOSPITAL_COMMUNITY)
Admission: EM | Admit: 2018-02-24 | Discharge: 2018-02-24 | Disposition: A | Discharge status: Home / Self Care | Payer: Medicare HMO | Attending: Emergency Medicine | Admitting: Emergency Medicine

## 2018-02-24 ENCOUNTER — Encounter (HOSPITAL_COMMUNITY): Payer: Self-pay | Admitting: Emergency Medicine

## 2018-02-24 DIAGNOSIS — Z950 Presence of cardiac pacemaker: Secondary | ICD-10-CM | POA: Insufficient documentation

## 2018-02-24 DIAGNOSIS — J029 Acute pharyngitis, unspecified: Secondary | ICD-10-CM

## 2018-02-24 DIAGNOSIS — M279 Disease of jaws, unspecified: Secondary | ICD-10-CM | POA: Diagnosis not present

## 2018-02-24 DIAGNOSIS — Z79899 Other long term (current) drug therapy: Secondary | ICD-10-CM | POA: Diagnosis not present

## 2018-02-24 DIAGNOSIS — R0602 Shortness of breath: Secondary | ICD-10-CM | POA: Diagnosis not present

## 2018-02-24 DIAGNOSIS — R509 Fever, unspecified: Secondary | ICD-10-CM | POA: Diagnosis not present

## 2018-02-24 DIAGNOSIS — R072 Precordial pain: Secondary | ICD-10-CM | POA: Insufficient documentation

## 2018-02-24 DIAGNOSIS — Z7982 Long term (current) use of aspirin: Secondary | ICD-10-CM | POA: Insufficient documentation

## 2018-02-24 LAB — BASIC METABOLIC PANEL
Anion gap: 11 (ref 5–15)
BUN: 10 mg/dL (ref 6–20)
CO2: 25 mmol/L (ref 22–32)
Calcium: 9.1 mg/dL (ref 8.9–10.3)
Chloride: 104 mmol/L (ref 101–111)
Creatinine, Ser: 0.71 mg/dL (ref 0.44–1.00)
GFR calc Af Amer: >60 mL/min (ref >60)
GFR calc non Af Amer: >60 mL/min (ref >60)
Glucose, Bld: 123 mg/dL — ABNORMAL HIGH (ref 65–99)
Potassium: 4.1 mmol/L (ref 3.5–5.1)
Sodium: 140 mmol/L (ref 135–145)

## 2018-02-24 LAB — CBC
HCT: 38 % (ref 36.0–46.0)
Hemoglobin: 12.4 g/dL (ref 12.0–15.0)
MCH: 31.2 pg (ref 26.0–34.0)
MCHC: 32.6 g/dL (ref 30.0–36.0)
MCV: 95.7 fL (ref 78.0–100.0)
Platelets: 201 10*3/uL (ref 150–400)
RBC: 3.97 MIL/uL (ref 3.87–5.11)
RDW: 12.1 % (ref 11.5–15.5)
WBC: 7.6 10*3/uL (ref 4.0–10.5)

## 2018-02-24 LAB — TROPONIN I: Troponin I: <0.03 ng/mL (ref <0.03)

## 2018-02-24 MED ORDER — ONDANSETRON HCL 4 MG/2ML IJ SOLN
4.0000 mg | Freq: Once | INTRAMUSCULAR | Status: AC
  Administered 2018-02-24: 4 mg via INTRAVENOUS
  Filled 2018-02-24: qty 2

## 2018-02-24 MED ORDER — ASPIRIN 81 MG PO CHEW
324.0000 mg | CHEWABLE_TABLET | Freq: Once | ORAL | Status: DC
  Filled 2018-02-24: qty 4

## 2018-02-24 MED ORDER — ACETAMINOPHEN 325 MG PO TABS
650.0000 mg | ORAL_TABLET | Freq: Once | ORAL | Status: AC
  Administered 2018-02-24: 650 mg via ORAL
  Filled 2018-02-24: qty 2

## 2018-02-24 MED ORDER — SODIUM CHLORIDE 0.9 % IV SOLN: INTRAVENOUS | Status: DC

## 2018-02-24 MED ORDER — OSELTAMIVIR PHOSPHATE 75 MG PO CAPS: 75.0000 mg | ORAL_CAPSULE | Freq: Two times a day (BID) | ORAL | 0 refills | Status: DC

## 2018-02-24 MED ORDER — SODIUM CHLORIDE 0.9 % IV BOLUS (SEPSIS)
500.0000 mL | Freq: Once | INTRAVENOUS | Status: AC
Start: 2018-02-24 — End: 2018-02-24
  Administered 2018-02-24: 500 mL via INTRAVENOUS

## 2018-02-24 MED ORDER — HYDROMORPHONE HCL 1 MG/ML IJ SOLN
0.5000 mg | Freq: Once | INTRAMUSCULAR | Status: AC
  Administered 2018-02-24: 0.5 mg via INTRAVENOUS
  Filled 2018-02-24: qty 1

## 2018-02-24 MED ORDER — HYDROCODONE-ACETAMINOPHEN 5-325 MG PO TABS: 1.0000 | ORAL_TABLET | Freq: Four times a day (QID) | ORAL | 0 refills | Status: DC | PRN

## 2018-02-24 MED ORDER — IOPAMIDOL (ISOVUE-300) INJECTION 61%
75.0000 mL | Freq: Once | INTRAVENOUS | Status: AC | PRN
  Administered 2018-02-24: 75 mL via INTRAVENOUS

## 2018-02-24 NOTE — ED Notes (Signed)
Patient reports jaw pain since this morning.  Tearful in triage.

## 2018-02-24 NOTE — Discharge Instructions (Signed)
CT of the neck soft tissue does not show any significant abnormalities other than some swelling in this which could be due to the developing upper respiratory infection.  Take Tylenol as needed for the fever.  Tamiflu provided in case you get more flulike symptoms could start to take that to be on the safe side.  But since she had the flu vaccine unlikely that it is influenza.  Tamiflu will not cause any problems.  Make an appointment to follow-up with your doctor.  In the next few days for recheck.  Return for any new or worse symptoms.  Chest pain does not seem to be related to the heart.  Heart testing here is been negative.

## 2018-02-24 NOTE — ED Triage Notes (Addendum)
Per EMS, pt reported bilateral jaw pain since this am. Pt has been taking aspirin at home throughout the day x3. Pt chewed 4 baby aspirin prior to EMS arrival. Pt reports has been under a lot of stress lately due to loss of family members. Pt denies chest pain but reports chest tenderness. nad noted. Moderate anxiety noted in triage.

## 2018-02-24 NOTE — ED Provider Notes (Signed)
[facility] EMERGENCY DEPARTMENT Provider Note   CSN: [id number] Arrival date & time: 02/24/18  1757     History   Chief Complaint Chief Complaint  Patient presents with  . Jaw Pain    HPI [name] is a 67 y.o. female.  Patient with acute onset this morning at 8:00 of upper chest burning pain sensation right below the base of the neck.  And then pain in both sides of the neck radiating into the lower part of the jaw.  No pain to the TMJ joint area.  No difficulty breathing.  Chest pain is made worse by taking a deep breath.  Patient arrived here with low-grade fever of 100.5.  Patient without a cough no runny nose denies a sore throat denies any tooth pain.  No headache no body aches no abdominal pain no nausea vomiting or diarrhea.  The upper chest pain is persisted nonstop since its onset at 8:00 this morning.  Patient has been under a lot of stress lately.  Patient did have the flu shot this year.  Patient's primary care doctor is Dr. Riley Kill.      Past Medical History:  Diagnosis Date  . Anxiety   . Diverticulosis of colon   . Dysrhythmia    AFib  . History of kidney stones   . Osteoarthritis   . Osteoporosis   . Paroxysmal atrial fibrillation (HCC)    with SVT response  . Presence of permanent cardiac pacemaker   . Sinus node dysfunction (HCC)   . Tachy-brady syndrome [facility])     Patient Active Problem List   Diagnosis Date Noted  . Acute diverticulitis 10/21/2017  . Tachy-brady syndrome ([place]) 07/23/2015  . Low back pain 02/22/2014  . Rectal bleeding 07/07/2013  . Hemorrhoid 07/07/2013  . Paroxysmal SVT (supraventricular tachycardia) ([place]) 05/11/2013  . Drug-induced bradycardia 05/11/2013  . Pacemaker 05/11/2013  . Vomiting 06/03/2011  . Abdominal pain, acute, generalized 06/03/2011  . ANXIETY 09/18/2008  . HEMORRHOIDS, INTERNAL 09/18/2008  . IRRITABLE BOWEL SYNDROME 09/18/2008  . RECTAL BLEEDING 09/18/2008  . OSTEOPOROSIS 09/18/2008  . ARTERIOVENOUS  MALFORMATION 09/18/2008  . WEIGHT LOSS 09/18/2008  . Diarrhea 09/18/2008    Past Surgical History:  Procedure Laterality Date  . ABDOMINAL HYSTERECTOMY    . APPENDECTOMY    . BREAST BIOPSY     right, benign  . CERVICAL SPINE SURGERY     two  . CHOLECYSTECTOMY    . COLONOSCOPY  2007   sigmoid diverticula, few ulcerations of terminal ileum, biopsies of TI unremarkable, random colon bx neg for microscopic colitis.  . COLONOSCOPY  06/04/11   Dr. Gala Romney- pegmentation of the rectum, pancolonic diverticula, solitary ulcer in the mouth of the ileocecal valve, distal 15 cm of the terminal ileum appeared normal. Random colon biopsies were benign. Ileocecal valve ulcer or was benign without any supportive evidence of inflammatory bowel disease.  . ESOPHAGOGASTRODUODENOSCOPY  06/04/11   Dr. Vivi Ferns esophagus, deffuse petechial and gastric submucosal petechiae- benign mucousa on bx. Small bowel biopsy benign. No H. pylori.  Randolm Idol / REPLACE / REMOVE PACEMAKER    . PERMANENT PACEMAKER INSERTION  07/2011  . s/p hysterectomy    . sb capsule study  2009   Two single small nonbleeding AVMs in the  proximal small bowel, single lymphangiectasia in mid small bowel.  . vocal cord polypectomy      OB History    No data available       Home Medications  Prior to Admission medications   Medication Sig Start Date End Date Taking? Authorizing Provider  alendronate (FOSAMAX) 70 MG tablet Take 70 mg by mouth every Monday. Take with a full glass of water on an empty stomach.    Yes [provider]  ALPRAZolam Duanne Moron) 0.5 MG tablet Take 0.5 mg by mouth 2 (two) times daily as needed for anxiety.  04/22/11  Yes [provider]  aspirin 81 MG tablet Take 81 mg by mouth every Wednesday.    Yes [provider]  cholecalciferol (VITAMIN D) 1000 units tablet Take 1,000 Units by mouth daily.    Yes [provider]  Cyanocobalamin (B-12) 500 MCG TABS Take 500 mcg daily by  mouth.    Yes [provider]  ferrous sulfate 325 (65 FE) MG tablet Take 325 mg by mouth daily as needed (for supplement).    Yes [provider]  Menthol, Topical Analgesic, (BIOFREEZE EX) Apply 1 application topically daily as needed (for pain).   Yes [provider]  Multiple Vitamins-Minerals (ALIVE WOMENS 50+ PO) Take 1 tablet by mouth daily.   Yes [provider]  Naphazoline HCl (CLEAR EYES OP) Place 1 drop into both eyes daily as needed (for dry eyes).   Yes [provider]  Omega-3 Fatty Acids (FISH OIL) 1200 MG CAPS Take 1,200 mg by mouth daily.    Yes [provider]  oxyCODONE (ROXICODONE) 15 MG immediate release tablet Take 15 mg by mouth 4 (four) times daily as needed (for pain).  07/12/15  Yes [provider]  zolpidem (AMBIEN) 10 MG tablet Take 10 mg by mouth at bedtime.  04/29/11  Yes [provider]  HYDROcodone-acetaminophen (NORCO/VICODIN) 5-325 MG tablet Take 1-2 tablets by mouth every 6 (six) hours as needed. 02/24/18   Fredia Sorrow, MD  oseltamivir (TAMIFLU) 75 MG capsule Take 1 capsule (75 mg total) by mouth every 12 (twelve) hours. 02/24/18   Fredia Sorrow, MD    Family History Family History  Problem Relation Age of Onset  . GI problems Sister        had colostomy (infection)  . Stroke Father 32  . Heart attack Father 29  . COPD Mother 61  . Hypertension Brother   . Diabetes Brother   . Colon cancer Neg Hx   . Inflammatory bowel disease Neg Hx     Social History Social History   Tobacco Use  . Smoking status: Never Smoker  . Smokeless tobacco: Never Used  Substance Use Topics  . Alcohol use: No  . Drug use: No     Allergies   Ampicillin and Iodine   Review of Systems Review of Systems  Constitutional: Positive for fever.  HENT: Negative for congestion, dental problem, drooling, sore throat, trouble swallowing and voice change.   Eyes: Negative for redness.  Respiratory:  Negative for shortness of breath and stridor.   Cardiovascular: Positive for chest pain.  Gastrointestinal: Negative for abdominal pain, diarrhea, nausea and vomiting.  Genitourinary: Negative for dysuria.  Musculoskeletal: Positive for neck pain. Negative for back pain and neck stiffness.  Skin: Negative for rash.  Neurological: Negative for headaches.  Hematological: Does not bruise/bleed easily.  Psychiatric/Behavioral: Negative for confusion.     Physical Exam Updated Vital Signs BP (!) 109/52 (BP Location: Right Arm)   Pulse 80   Temp 100.2 F (37.9 C) (Oral)   Resp 20   Ht 1.575 m (5\' 2" )   Wt 63.5 kg (140 lb)  SpO2 94%   BMI 25.61 kg/m   Physical Exam  Constitutional: She is oriented to person, place, and time. She appears well-developed and well-nourished. No distress.  HENT:  Head: Normocephalic and atraumatic.  Mouth/Throat: Oropharynx is clear and moist. No oropharyngeal exudate.  Eyes: Conjunctivae and EOM are normal. Pupils are equal, round, and reactive to light.  Neck: Normal range of motion. Neck supple.  Cardiovascular: Normal rate, regular rhythm and normal heart sounds.  No murmur heard. Pulmonary/Chest: Effort normal and breath sounds normal. No stridor. No respiratory distress. She has no wheezes. She exhibits no tenderness.  Abdominal: Soft. Bowel sounds are normal. There is no tenderness.  Musculoskeletal: Normal range of motion. She exhibits no edema.  Neurological: She is alert and oriented to person, place, and time. No cranial nerve deficit or sensory deficit. She exhibits normal muscle tone. Coordination normal.  Skin: Skin is warm. Capillary refill takes less than 2 seconds. No rash noted.  Nursing note and vitals reviewed.    ED Treatments / Results  Labs (all labs ordered are listed, but only abnormal results are displayed) Labs Reviewed  BASIC METABOLIC PANEL - Abnormal; Notable for the following components:      Result Value    Glucose, Bld 123 (*)    All other components within normal limits  CBC  TROPONIN I    EKG  EKG Interpretation  Date/Time:  Wednesday February 24 2018 18:05:16 EDT Ventricular Rate:  88 PR Interval:    QRS Duration: 58 QT Interval:  402 QTC Calculation: 487 R Axis:   20 Text Interpretation:  Sinus rhythm Low voltage, precordial leads Borderline T abnormalities, diffuse leads Borderline prolonged QT interval No significant change since last tracing History of atrial pacemaker Confirmed by Fredia Sorrow [phone]) on 02/24/2018 7:33:54 PM       Radiology Dg Chest 2 View  Result Date: 02/24/2018 CLINICAL DATA:  67 year old female with anxiety and shortness of breath. EXAM: CHEST - 2 VIEW COMPARISON:  Chest radiograph dated 07/18/2011 FINDINGS: There is mild diffuse interstitial coarsening and bibasilar atelectatic changes/scarring. No focal consolidation, pleural effusion, or pneumothorax. The cardiac silhouette is within normal limits. Left pectoral pacemaker device. No acute osseous pathology. IMPRESSION: No active cardiopulmonary disease. Electronically Signed   By: Anner Crete M.D.   On: 02/24/2018 18:41   Ct Soft Tissue Neck W Contrast  Result Date: 02/24/2018 CLINICAL DATA:  67 y/o F; Sore throat/stridor, epiglottis or tonsillitis suspected. Bilateral jaw pain and sore throat. EXAM: CT NECK WITH CONTRAST TECHNIQUE: Multidetector CT imaging of the neck was performed using the standard protocol following the bolus administration of intravenous contrast. CONTRAST:  97mL ISOVUE-300 IOPAMIDOL (ISOVUE-300) INJECTION 61% COMPARISON:  None. FINDINGS: Pharynx and larynx: Mild oropharyngeal mucosal thickening. No abscess or deep cervical inflammation. Effacement of the larynx is likely due to formation. No epiglottic thickening. Salivary glands: No inflammation, mass, or stone. Thyroid: Normal. Lymph nodes: None enlarged or abnormal density. Vascular: Fibrofatty plaque of the left carotid  bifurcation with mild less than 50% distal common carotid and proximal ICA stenosis. Mild non stenotic calcified plaque of the right carotid bifurcation. Limited intracranial: Negative. Visualized orbits: Negative. Mastoids and visualized paranasal sinuses: Clear. Skeleton: C4-C7 anterior cervical discectomy and fusion. Moderate C3-4 and mild C7-T1 adjacent segment disease with disc and facet degenerative changes. At C3-4 a disc bulge contacts the anterior cord with mild canal stenosis. Left-sided facet hypertrophy at C7-T1 encroaches on the neural foramen and similar findings are present bilaterally at C3-4.  Upper chest: Negative. Other: None. IMPRESSION: 1. Mild oropharyngeal mucosal thickening may represent acute pharyngitis. No abscess or inflammation in the deep cervical compartments. 2. Mixed plaque of carotid bifurcations. 3. C4-C7 anterior cervical discectomy and fusion. Moderate C3-4 and mild C7-T1 adjacent segment disease. Electronically Signed   By: Kristine Garbe M.D.   On: 02/24/2018 22:02    Procedures Procedures (including critical care time)  Medications Ordered in ED Medications  0.9 %  sodium chloride infusion (not administered)  aspirin chewable tablet 324 mg (324 mg Oral Not Given 02/24/18 2134)  ondansetron (ZOFRAN) injection 4 mg (4 mg Intravenous Given 02/24/18 2042)  sodium chloride 0.9 % bolus 500 mL (0 mLs Intravenous Stopped 02/24/18 2134)  HYDROmorphone (DILAUDID) injection 0.5 mg (0.5 mg Intravenous Given 02/24/18 2042)  iopamidol (ISOVUE-300) 61 % injection 75 mL (75 mLs Intravenous Contrast Given 02/24/18 2122)  acetaminophen (TYLENOL) tablet 650 mg (650 mg Oral Given 02/24/18 2157)     Initial Impression / Assessment and Plan / ED Course  I have reviewed the triage vital signs and the nursing notes.  Pertinent labs & imaging results that were available during my care of the patient were reviewed by me and considered in my medical decision making (see chart for  details).     Patient's cardiac workup without any acute findings.  Despite 11 hours of constant upper chest pain which seems noncardiac in nature troponins negative.  Do not feel that delta troponin required based on the duration of the pain.  Pain improved with pain medicine here.  Chest x-ray negative EKG without acute findings.  Rest of the workup was geared towards the neck pain.  No neck stiffness.  Patient's oropharynx normal to exam.  No adenopathy no palpable mass.  No salivary gland enlargement.  CT soft tissue neck does show some pharyngitis.  Based on the fact the patient came in with a temperature of 100.5 and then went up to 101 suspect that she is got a developing upper respiratory infection.  Possibly flulike in nature but she has had the flu vaccine.  The patient will be given a prescription for Tamiflu.  Will need to take Tylenol for the fever.  Patient treated with hydrocodone for the chest pain.  Again do not feel is cardiac in nature also do not feel to pulmonary embolus.  Patient can be treated symptomatically for additional symptoms that may develop.  Recommend close follow-up with her primary care doctor and returning for any new or worse symptoms here.  Patient nontoxic no acute distress.  Not tachycardic not hypoxic.  Final Clinical Impressions(s) / ED Diagnoses   Final diagnoses:  Fever, unspecified fever cause  Pharyngitis, unspecified etiology  Precordial pain    ED Discharge Orders        Ordered    HYDROcodone-acetaminophen (NORCO/VICODIN) 5-325 MG tablet  Every 6 hours PRN     02/24/18 2226    oseltamivir (TAMIFLU) 75 MG capsule  Every 12 hours     02/24/18 2226       Fredia Sorrow, MD 02/24/18 2234

## 2018-03-01 DIAGNOSIS — E663 Overweight: Secondary | ICD-10-CM | POA: Diagnosis not present

## 2018-03-01 DIAGNOSIS — Z1389 Encounter for screening for other disorder: Secondary | ICD-10-CM | POA: Diagnosis not present

## 2018-03-01 DIAGNOSIS — Z6826 Body mass index (BMI) 26.0-26.9, adult: Secondary | ICD-10-CM | POA: Diagnosis not present

## 2018-03-01 DIAGNOSIS — J029 Acute pharyngitis, unspecified: Secondary | ICD-10-CM | POA: Diagnosis not present

## 2018-03-15 ENCOUNTER — Telehealth: Payer: Self-pay

## 2018-03-15 ENCOUNTER — Ambulatory Visit: Payer: Medicare HMO | Admitting: Nurse Practitioner

## 2018-03-15 ENCOUNTER — Other Ambulatory Visit: Payer: Self-pay

## 2018-03-15 ENCOUNTER — Encounter: Payer: Self-pay | Admitting: Nurse Practitioner

## 2018-03-15 VITALS — BP 110/64 | HR 73 | Temp 97.0°F | Ht 62.5 in | Wt 139.4 lb

## 2018-03-15 DIAGNOSIS — K5792 Diverticulitis of intestine, part unspecified, without perforation or abscess without bleeding: Secondary | ICD-10-CM

## 2018-03-15 DIAGNOSIS — R197 Diarrhea, unspecified: Secondary | ICD-10-CM | POA: Diagnosis not present

## 2018-03-15 DIAGNOSIS — R142 Eructation: Secondary | ICD-10-CM | POA: Diagnosis not present

## 2018-03-15 DIAGNOSIS — K5732 Diverticulitis of large intestine without perforation or abscess without bleeding: Secondary | ICD-10-CM

## 2018-03-15 NOTE — Progress Notes (Signed)
cc'd to pcp 

## 2018-03-15 NOTE — Telephone Encounter (Signed)
Tried to call pt to inform of pre-op appt 04/19/18 at 10:00am, no answer, LMOAM. Letter mailed.

## 2018-03-15 NOTE — Patient Instructions (Signed)
1. We will schedule your colonoscopy for you. 2. Other recommendations will be made after your colonoscopy. 3. Avoid foods that tend to trigger your symptoms or make the belching worse. 4. Follow-up based on recommendations made after your colonoscopy. 5. Call us if you have any questions or concerns. 6. Call us if you have any worsening symptoms.    It was great to see you today!  I hope you have a wonderful Spring and Summer!!!    At [facility] we value your feedback. You may receive a survey about your visit today. Please share your experience as we strive to create trusing relationships with our patients to provide genuine, compassionate, quality care.

## 2018-03-15 NOTE — Assessment & Plan Note (Addendum)
The patient does have excessive belching.  Denies overt GERD symptoms both in general and specifically.  She denies significant carbonated beverage intake.  Recommend continue over-the-counter probiotic.  Follow-up based on post procedure recommendations, otherwise follow-up in 6 months.  Call with any worsening symptoms.

## 2018-03-15 NOTE — Progress Notes (Signed)
Referring Provider: Redmond School, MD Primary Care Physician:  Redmond School, MD Primary GI:  Dr. Gala Romney  Chief Complaint  Patient presents with  . Diarrhea    improved, once a day  . bleching/gas    HPI:   [name] is a 67 y.o. female who presents for follow-up on diarrhea and diverticulitis.  The patient was last seen in our office 10/21/2017 for the same.  Last EGD and colonoscopy in 2012; EGD and colonoscopy in 2012 found diffuse petechiae and gastric submucosal petechiae with unremarkable biopsies and small bowel negative for celiac disease.  Colonoscopy with pancolonic diverticula and a single ulcer at the ileocecal valve with a benign biopsy and 15 cm of the distal terminal ileum appearing normal.  Random colon biopsies were negative and it was felt the ulcer may be secondary to aspirin use which resolved with Entocort.    Emergency department visit 07/25/2017 for acute diverticulitis.  She was prescribed Cipro and Flagyl.    At her last visit overall she was doing better, no further pain.  Diarrhea intermittent for the previous few months and stools vary from loose to diarrhea.  No other GI complaints.  Recommended labs and stool studies to help figure out diarrhea, start probiotic, schedule colonoscopy, follow-up in 3 months.  CBC, C. difficile, GI pathogen panel negative.  Colonoscopy was scheduled for 12/31/2017 but she had to cancel due to a death in the family.  This was her second procedural cancellation.  She indicated she would call us when she is back in town to reschedule.  She has not called.  Today she states she's doing much better. Taking probiotic and diarrhea has resolved. Feels probiotic helped a lot. Stools once a day, consistent with [place] 4. Denies abdominal pain, N/V. Does have a lot of belching and gas. Denies GERD symptoms, esophageal burning, bitter taste. Drinks minimal sodas. Eats green beans, squash, and salad for vegetables. Avoids peanuts due  to history of diverticulitis. Denies hematochezia, melena, unintentional weight loss. Denies chest pain, dyspnea, dizziness, lightheadedness, syncope, near syncope. Denies any other upper or lower GI symptoms.  She does note she has a lot of stress related to family.   Past Medical History:  Diagnosis Date  . Anxiety   . Diverticulosis of colon   . Dysrhythmia    AFib  . History of kidney stones   . Osteoarthritis   . Osteoporosis   . Paroxysmal atrial fibrillation (HCC)    with SVT response  . Presence of permanent cardiac pacemaker   . Sinus node dysfunction (HCC)   . Tachy-brady syndrome [facility])     Past Surgical History:  Procedure Laterality Date  . ABDOMINAL HYSTERECTOMY    . APPENDECTOMY    . BREAST BIOPSY     right, benign  . CERVICAL SPINE SURGERY     two  . CHOLECYSTECTOMY    . COLONOSCOPY  2007   sigmoid diverticula, few ulcerations of terminal ileum, biopsies of TI unremarkable, random colon bx neg for microscopic colitis.  . COLONOSCOPY  06/04/11   Dr. Gala Romney- pegmentation of the rectum, pancolonic diverticula, solitary ulcer in the mouth of the ileocecal valve, distal 15 cm of the terminal ileum appeared normal. Random colon biopsies were benign. Ileocecal valve ulcer or was benign without any supportive evidence of inflammatory bowel disease.  . ESOPHAGOGASTRODUODENOSCOPY  06/04/11   Dr. Vivi Ferns esophagus, deffuse petechial and gastric submucosal petechiae- benign mucousa on bx. Small bowel biopsy benign. No H. pylori.  Marland Kitchen  INSERT / REPLACE / REMOVE PACEMAKER    . PERMANENT PACEMAKER INSERTION  07/2011  . s/p hysterectomy    . sb capsule study  2009   Two single small nonbleeding AVMs in the  proximal small bowel, single lymphangiectasia in mid small bowel.  . vocal cord polypectomy      Current Outpatient Medications  Medication Sig Dispense Refill  . acidophilus (RISAQUAD) CAPS capsule Take 1 capsule by mouth daily.    Marland Kitchen alendronate (FOSAMAX) 70 MG tablet  Take 70 mg by mouth every Monday. Take with a full glass of water on an empty stomach.     . ALPRAZolam (XANAX) 0.5 MG tablet Take 0.5 mg by mouth 2 (two) times daily as needed for anxiety.     Marland Kitchen aspirin 81 MG tablet Take 81 mg by mouth every Wednesday.     . cholecalciferol (VITAMIN D) 1000 units tablet Take 1,000 Units by mouth daily.     . Cyanocobalamin (B-12) 500 MCG TABS Take 500 mcg daily by mouth.     . ferrous sulfate 325 (65 FE) MG tablet Take 325 mg by mouth daily as needed (for supplement).     Marland Kitchen FLUoxetine (PROZAC) 10 MG tablet Take 10 mg by mouth daily.    . Menthol, Topical Analgesic, (BIOFREEZE EX) Apply 1 application topically daily as needed (for pain).    . Multiple Vitamins-Minerals (ALIVE WOMENS 50+ PO) Take 1 tablet by mouth. Every other week    . Naphazoline HCl (CLEAR EYES OP) Place 1 drop into both eyes daily as needed (for dry eyes).    . Omega-3 Fatty Acids (FISH OIL) 1200 MG CAPS Take 1,200 mg by mouth daily.     Marland Kitchen oxyCODONE (ROXICODONE) 15 MG immediate release tablet Take 15-30 mg by mouth every 6 (six) hours as needed (for pain).     Marland Kitchen zolpidem (AMBIEN) 10 MG tablet Take 10 mg by mouth at bedtime.      No current facility-administered medications for this visit.     Allergies as of 03/15/2018 - Review Complete 03/15/2018  Allergen Reaction Noted  . Ampicillin Other (See Comments)   . Iodine Swelling and Other (See Comments)     Family History  Problem Relation Age of Onset  . GI problems Sister        had colostomy (infection)  . Stroke Father 12  . Heart attack Father 65  . COPD Mother 55  . Hypertension Brother   . Diabetes Brother   . Colon cancer Neg Hx   . Inflammatory bowel disease Neg Hx     Social History   Socioeconomic History  . Marital status: Divorced    Spouse name: Not on file  . Number of children: Not on file  . Years of education: Not on file  . Highest education level: Not on file  Occupational History  . Occupation:  Publishing copy  Social Needs  . Financial resource strain: Not on file  . Food insecurity:    Worry: Not on file    Inability: Not on file  . Transportation needs:    Medical: Not on file    Non-medical: Not on file  Tobacco Use  . Smoking status: Former Smoker    Types: Cigarettes    Last attempt to quit: 12/15/1973    Years since quitting: 44.2  . Smokeless tobacco: Never Used  . Tobacco comment: occas  Substance and Sexual Activity  . Alcohol use: No  . Drug use: No  .  Sexual activity: Not on file  Lifestyle  . Physical activity:    Days per week: Not on file    Minutes per session: Not on file  . Stress: Not on file  Relationships  . Social connections:    Talks on phone: Not on file    Gets together: Not on file    Attends religious service: Not on file    Active member of club or organization: Not on file    Attends meetings of clubs or organizations: Not on file    Relationship status: Not on file  Other Topics Concern  . Not on file  Social History Narrative   Has one son    Review of Systems: General: Negative for anorexia, weight loss, fever, chills, fatigue, weakness. ENT: Negative for hoarseness, difficulty swallowing. CV: Negative for chest pain, angina, palpitations, peripheral edema.  Respiratory: Negative for dyspnea at rest, cough, sputum, wheezing.  GI: See history of present illness. Endo: Negative for unusual weight change.  Heme: Negative for bruising or bleeding.   Physical Exam: BP 110/64   Pulse 73   Temp (!) 97 F (36.1 C) (Oral)   Ht 5' 2.5" (1.588 m)   Wt 139 lb 6.4 oz (63.2 kg)   BMI 25.09 kg/m  General:   Alert and oriented. Pleasant and cooperative. Well-nourished and well-developed.  Eyes:  Without icterus, sclera clear and conjunctiva pink.  Ears:  Normal auditory acuity. Cardiovascular:  S1, S2 present without murmurs appreciated. Extremities without clubbing or edema. Respiratory:  Clear to auscultation bilaterally. No wheezes,  rales, or rhonchi. No distress.  Gastrointestinal:  +BS, soft, non-tender and non-distended. No HSM noted. No guarding or rebound. No masses appreciated.  Rectal:  Deferred  Musculoskalatal:  Symmetrical without gross deformities. Neurologic:  Alert and oriented x4;  grossly normal neurologically. Psych:  Alert and cooperative. Normal mood and affect. Heme/Lymph/Immune: No excessive bruising noted.    03/15/2018 9:51 AM   Disclaimer: This note was dictated with voice recognition software. Similar sounding words can inadvertently be transcribed and may not be corrected upon review.

## 2018-03-15 NOTE — Assessment & Plan Note (Addendum)
Diarrhea has resolved.  She continues probiotic.  Has daily Bristol 4 stools.  She is very happy about this.  Recommend she continue probiotic, follow-up as needed or based on post procedure recommendations, otherwise follow-up in 6 months.

## 2018-03-15 NOTE — Assessment & Plan Note (Signed)
Recent history of acute diverticulitis treated in the emergency department.  She was seen in the office afterwards and recommended colonoscopy.  She had to cancel due to a death in the family.  She is now able to reschedule.  At this point we will proceed with colonoscopy to further evaluate.  This will also allow [place] to shed some light on possible etiologies behind her diarrhea.  Follow-up based on post procedure recommendations.  Proceed with TCS on propofol/MAC with Dr. Gala Romney in near future: the risks, benefits, and alternatives have been discussed with the patient in detail. The patient states understanding and desires to proceed.  Patient is currently on oxycodone, Ambien, Prozac, and Xanax.  No other anticoagulants, anxiolytics, chronic pain medications, or antidepressants.  We will plan for the procedure on propofol/MAC to promote adequate sedation.

## 2018-04-15 ENCOUNTER — Encounter (HOSPITAL_COMMUNITY): Payer: Self-pay

## 2018-04-16 ENCOUNTER — Encounter (HOSPITAL_COMMUNITY)
Admission: RE | Admit: 2018-04-16 | Discharge: 2018-04-16 | Disposition: A | Discharge status: Home / Self Care | Payer: Medicare HMO | Source: Ambulatory Visit | Attending: Internal Medicine | Admitting: Internal Medicine

## 2018-04-19 ENCOUNTER — Encounter (HOSPITAL_COMMUNITY)
Admission: RE | Admit: 2018-04-19 | Discharge: 2018-04-19 | Disposition: A | Discharge status: Home / Self Care | Payer: Medicare HMO | Source: Ambulatory Visit | Attending: Internal Medicine | Admitting: Internal Medicine

## 2018-04-24 DIAGNOSIS — R935 Abnormal findings on diagnostic imaging of other abdominal regions, including retroperitoneum: Secondary | ICD-10-CM | POA: Diagnosis not present

## 2018-04-24 DIAGNOSIS — K5720 Diverticulitis of large intestine with perforation and abscess without bleeding: Secondary | ICD-10-CM | POA: Diagnosis not present

## 2018-04-24 DIAGNOSIS — Z978 Presence of other specified devices: Secondary | ICD-10-CM | POA: Diagnosis not present

## 2018-04-24 DIAGNOSIS — D72824 Basophilia: Secondary | ICD-10-CM | POA: Diagnosis not present

## 2018-04-24 DIAGNOSIS — D72829 Elevated white blood cell count, unspecified: Secondary | ICD-10-CM | POA: Diagnosis not present

## 2018-04-25 DIAGNOSIS — D72829 Elevated white blood cell count, unspecified: Secondary | ICD-10-CM | POA: Diagnosis not present

## 2018-04-25 DIAGNOSIS — D72824 Basophilia: Secondary | ICD-10-CM | POA: Diagnosis not present

## 2018-04-25 DIAGNOSIS — R935 Abnormal findings on diagnostic imaging of other abdominal regions, including retroperitoneum: Secondary | ICD-10-CM | POA: Diagnosis not present

## 2018-04-25 DIAGNOSIS — K5720 Diverticulitis of large intestine with perforation and abscess without bleeding: Secondary | ICD-10-CM | POA: Diagnosis not present

## 2018-04-25 DIAGNOSIS — Z978 Presence of other specified devices: Secondary | ICD-10-CM | POA: Diagnosis not present

## 2018-04-26 ENCOUNTER — Ambulatory Visit (HOSPITAL_COMMUNITY): Payer: Medicare HMO | Admitting: Anesthesiology

## 2018-04-26 ENCOUNTER — Encounter (HOSPITAL_COMMUNITY)
Admission: RE | Disposition: A | Discharge status: Home / Self Care | Payer: Self-pay | Source: Ambulatory Visit | Attending: Internal Medicine

## 2018-04-26 ENCOUNTER — Ambulatory Visit (HOSPITAL_COMMUNITY)
Admission: RE | Admit: 2018-04-26 | Discharge: 2018-04-26 | Disposition: A | Discharge status: Home / Self Care | Payer: Medicare HMO | Source: Ambulatory Visit | Attending: Internal Medicine | Admitting: Internal Medicine

## 2018-04-26 ENCOUNTER — Encounter (HOSPITAL_COMMUNITY): Payer: Self-pay | Admitting: *Deleted

## 2018-04-26 DIAGNOSIS — F419 Anxiety disorder, unspecified: Secondary | ICD-10-CM | POA: Diagnosis not present

## 2018-04-26 DIAGNOSIS — Z87891 Personal history of nicotine dependence: Secondary | ICD-10-CM | POA: Insufficient documentation

## 2018-04-26 DIAGNOSIS — Z7982 Long term (current) use of aspirin: Secondary | ICD-10-CM | POA: Insufficient documentation

## 2018-04-26 DIAGNOSIS — K5730 Diverticulosis of large intestine without perforation or abscess without bleeding: Secondary | ICD-10-CM | POA: Insufficient documentation

## 2018-04-26 DIAGNOSIS — Z950 Presence of cardiac pacemaker: Secondary | ICD-10-CM | POA: Insufficient documentation

## 2018-04-26 DIAGNOSIS — Z79899 Other long term (current) drug therapy: Secondary | ICD-10-CM | POA: Insufficient documentation

## 2018-04-26 DIAGNOSIS — Z800 Family history of malignant neoplasm of digestive organs: Secondary | ICD-10-CM | POA: Diagnosis not present

## 2018-04-26 DIAGNOSIS — I480 Paroxysmal atrial fibrillation: Secondary | ICD-10-CM | POA: Insufficient documentation

## 2018-04-26 DIAGNOSIS — I471 Supraventricular tachycardia: Secondary | ICD-10-CM | POA: Insufficient documentation

## 2018-04-26 DIAGNOSIS — Z87442 Personal history of urinary calculi: Secondary | ICD-10-CM | POA: Diagnosis not present

## 2018-04-26 DIAGNOSIS — K5732 Diverticulitis of large intestine without perforation or abscess without bleeding: Secondary | ICD-10-CM

## 2018-04-26 DIAGNOSIS — R933 Abnormal findings on diagnostic imaging of other parts of digestive tract: Secondary | ICD-10-CM

## 2018-04-26 HISTORY — PX: COLONOSCOPY WITH PROPOFOL: SHX5780

## 2018-04-26 SURGERY — COLONOSCOPY WITH PROPOFOL
Anesthesia: Monitor Anesthesia Care

## 2018-04-26 MED ORDER — LACTATED RINGERS IV SOLN
INTRAVENOUS | Status: DC
  Administered 2018-04-26: 1000 mL via INTRAVENOUS

## 2018-04-26 MED ORDER — CHLORHEXIDINE GLUCONATE CLOTH 2 % EX PADS: 6.0000 | MEDICATED_PAD | Freq: Once | CUTANEOUS | Status: DC

## 2018-04-26 MED ORDER — PROPOFOL 500 MG/50ML IV EMUL
INTRAVENOUS | Status: DC | PRN
  Administered 2018-04-26: 150 ug/kg/min via INTRAVENOUS

## 2018-04-26 MED ORDER — PROPOFOL 10 MG/ML IV BOLUS
INTRAVENOUS | Status: DC | PRN
  Administered 2018-04-26: 10 mg via INTRAVENOUS

## 2018-04-26 MED ORDER — PROPOFOL 10 MG/ML IV BOLUS
INTRAVENOUS | Status: AC
  Filled 2018-04-26: qty 40

## 2018-04-26 NOTE — Discharge Instructions (Signed)
Diverticulosis Diverticulosis is a condition that develops when small pouches (diverticula) form in the wall of the large intestine (colon). The colon is where water is absorbed and stool is formed. The pouches form when the inside layer of the colon pushes through weak spots in the outer layers of the colon. You may have a few pouches or many of them. What are the causes? The cause of this condition is not known. What increases the risk? The following factors may make you more likely to develop this condition:  Being older than age 27. Your risk for this condition increases with age. Diverticulosis is rare among people younger than age 36. By age 28, many people have it.  Eating a low-fiber diet.  Having frequent constipation.  Being overweight.  Not getting enough exercise.  Smoking.  Taking over-the-counter pain medicines, like aspirin and ibuprofen.  Having a family history of diverticulosis.  What are the signs or symptoms? In most people, there are no symptoms of this condition. If you do have symptoms, they may include:  Bloating.  Cramps in the abdomen.  Constipation or diarrhea.  Pain in the lower left side of the abdomen.  How is this diagnosed? This condition is most often diagnosed during an exam for other colon problems. Because diverticulosis usually has no symptoms, it often cannot be diagnosed independently. This condition may be diagnosed by:  Using a flexible scope to examine the colon (colonoscopy).  Taking an X-ray of the colon after dye has been put into the colon (barium enema).  Doing a CT scan.  How is this treated? You may not need treatment for this condition if you have never developed an infection related to diverticulosis. If you have had an infection before, treatment may include:  Eating a high-fiber diet. This may include eating more fruits, vegetables, and grains.  Taking a fiber supplement.  Taking a live bacteria supplement  (probiotic).  Taking medicine to relax your colon.  Taking antibiotic medicines.  Follow these instructions at home:  Drink 6-8 glasses of water or more each day to prevent constipation.  Try not to strain when you have a bowel movement.  If you have had an infection before: ? Eat more fiber as directed by your health care provider or your diet and nutrition specialist (dietitian). ? Take a fiber supplement or probiotic, if your health care provider approves.  Take over-the-counter and prescription medicines only as told by your health care provider.  If you were prescribed an antibiotic, take it as told by your health care provider. Do not stop taking the antibiotic even if you start to feel better.  Keep all follow-up visits as told by your health care provider. This is important. Contact a health care provider if:  You have pain in your abdomen.  You have bloating.  You have cramps.  You have not had a bowel movement in 3 days. Get help right away if:  Your pain gets worse.  Your bloating becomes very bad.  You have a fever or chills, and your symptoms suddenly get worse.  You vomit.  You have bowel movements that are bloody or black.  You have bleeding from your rectum. Summary  Diverticulosis is a condition that develops when small pouches (diverticula) form in the wall of the large intestine (colon).  You may have a few pouches or many of them.  This condition is most often diagnosed during an exam for other colon problems.  If you have had an  infection related to diverticulosis, treatment may include increasing the fiber in your diet, taking supplements, or taking medicines. This information is not intended to replace advice given to you by your health care provider. Make sure you discuss any questions you have with your health care provider. Document Released: 08/28/2004 Document Revised: 10/20/2016 Document Reviewed: 10/20/2016 Elsevier Interactive  Patient Education  2017 [employer].  Colonoscopy Discharge Instructions  Read the instructions outlined below and refer to this sheet in the next few weeks. These discharge instructions provide you with general information on caring for yourself after you leave the hospital. Your doctor may also give you specific instructions. While your treatment has been planned according to the most current medical practices available, unavoidable complications occasionally occur. If you have any problems or questions after discharge, call Dr. Gala Romney at [phone]. ACTIVITY  You may resume your regular activity, but move at a slower pace for the next 24 hours.   Take frequent rest periods for the next 24 hours.   Walking will help get rid of the air and reduce the bloated feeling in your belly (abdomen).   No driving for 24 hours (because of the medicine (anesthesia) used during the test).    Do not sign any important legal documents or operate any machinery for 24 hours (because of the anesthesia used during the test).  NUTRITION  Drink plenty of fluids.   You may resume your normal diet as instructed by your doctor.   Begin with a light meal and progress to your normal diet. Heavy or fried foods are harder to digest and may make you feel sick to your stomach (nauseated).   Avoid alcoholic beverages for 24 hours or as instructed.  MEDICATIONS  You may resume your normal medications unless your doctor tells you otherwise.  WHAT YOU CAN EXPECT TODAY  Some feelings of bloating in the abdomen.   Passage of more gas than usual.   Spotting of blood in your stool or on the toilet paper.  IF YOU HAD POLYPS REMOVED DURING THE COLONOSCOPY:  No aspirin products for 7 days or as instructed.   No alcohol for 7 days or as instructed.   Eat a soft diet for the next 24 hours.  FINDING OUT THE RESULTS OF YOUR TEST Not all test results are available during your visit. If your test results are not back  during the visit, make an appointment with your caregiver to find out the results. Do not assume everything is normal if you have not heard from your caregiver or the medical facility. It is important for you to follow up on all of your test results.  SEEK IMMEDIATE MEDICAL ATTENTION IF:  You have more than a spotting of blood in your stool.   Your belly is swollen (abdominal distention).   You are nauseated or vomiting.   You have a temperature over 101.   You have abdominal pain or discomfort that is severe or gets worse throughout the day.    Diverticulosis information provided  I do not recommend future colonoscopy unless the symptoms develop  Consider taking a daily fiber supplement

## 2018-04-26 NOTE — Anesthesia Postprocedure Evaluation (Signed)
Anesthesia Post Note  Patient: [name]  Procedure(s) Performed: COLONOSCOPY WITH PROPOFOL (N/A )  Patient location during evaluation: PACU Anesthesia Type: MAC Level of consciousness: awake and alert and oriented Pain management: pain level controlled Vital Signs Assessment: post-procedure vital signs reviewed and stable Respiratory status: spontaneous breathing Cardiovascular status: blood pressure returned to baseline and stable Postop Assessment: no apparent nausea or vomiting Anesthetic complications: no     Last Vitals:  Vitals:   04/26/18 0720 04/26/18 0725  BP:  131/61  Pulse:    Resp: 12 (!) 29  Temp:    SpO2: 97% 100%    Last Pain:  Vitals:   04/26/18 0636  TempSrc: Oral  PainSc: 0-No pain                 Leah Thornberry

## 2018-04-26 NOTE — Transfer of Care (Signed)
Immediate Anesthesia Transfer of Care Note  Patient: [name]  Procedure(s) Performed: COLONOSCOPY WITH PROPOFOL (N/A )  Patient Location: PACU  Anesthesia Type:MAC  Level of Consciousness: awake and alert   Airway & Oxygen Therapy: Patient Spontanous Breathing and Patient connected to nasal cannula oxygen  Post-op Assessment: Report given to RN  Post vital signs: Reviewed and stable  Last Vitals:  Vitals Value Taken Time  BP    Temp    Pulse 64 04/26/2018  8:03 AM  Resp 24 04/26/2018  8:03 AM  SpO2 100 % 04/26/2018  8:03 AM  Vitals shown include unvalidated device data.  Last Pain:  Vitals:   04/26/18 0636  TempSrc: Oral  PainSc: 0-No pain      Patients Stated Pain Goal: 8 (94/17/40 8144)  Complications: No apparent anesthesia complications

## 2018-04-26 NOTE — H&P (Signed)
@LOGO @   Primary Care Physician:  Redmond School, MD Primary Gastroenterologist:  Dr. Gala Romney  Pre-Procedure History & Physical: HPI:  [name] is a 67 y.o. female here for further evaluation of abnormal CT last year. History diverticulitis. Doing well currently.  Past Medical History:  Diagnosis Date  . Anxiety   . Diverticulosis of colon   . Dysrhythmia    AFib  . History of kidney stones   . Osteoarthritis   . Osteoporosis   . Paroxysmal atrial fibrillation (HCC)    with SVT response  . Presence of permanent cardiac pacemaker   . Sinus node dysfunction (HCC)   . Tachy-brady syndrome [facility])     Past Surgical History:  Procedure Laterality Date  . ABDOMINAL HYSTERECTOMY    . APPENDECTOMY    . BREAST BIOPSY     right, benign  . CERVICAL SPINE SURGERY     two  . CHOLECYSTECTOMY    . COLONOSCOPY  2007   sigmoid diverticula, few ulcerations of terminal ileum, biopsies of TI unremarkable, random colon bx neg for microscopic colitis.  . COLONOSCOPY  06/04/11   Dr. Gala Romney- pegmentation of the rectum, pancolonic diverticula, solitary ulcer in the mouth of the ileocecal valve, distal 15 cm of the terminal ileum appeared normal. Random colon biopsies were benign. Ileocecal valve ulcer or was benign without any supportive evidence of inflammatory bowel disease.  . ESOPHAGOGASTRODUODENOSCOPY  06/04/11   Dr. Vivi Ferns esophagus, deffuse petechial and gastric submucosal petechiae- benign mucousa on bx. Small bowel biopsy benign. No H. pylori.  Randolm Idol / REPLACE / REMOVE PACEMAKER    . PERMANENT PACEMAKER INSERTION  07/2011  . s/p hysterectomy    . sb capsule study  2009   Two single small nonbleeding AVMs in the  proximal small bowel, single lymphangiectasia in mid small bowel.  . vocal cord polypectomy      Prior to Admission medications   Medication Sig Start Date End Date Taking? Authorizing Provider  acidophilus (RISAQUAD) CAPS capsule Take 1 capsule by mouth daily.    Yes [provider]  alendronate (FOSAMAX) 70 MG tablet Take 70 mg by mouth every Monday. Take with a full glass of water on an empty stomach.    Yes [provider]  ALPRAZolam Duanne Moron) 0.5 MG tablet Take 0.5 mg by mouth daily as needed for anxiety.  04/22/11  Yes [provider]  aspirin 81 MG tablet Take 81 mg by mouth 2 (two) times a week.    Yes [provider]  Cholecalciferol (VITAMIN D) 2000 units tablet Take 2,000 Units by mouth daily.   Yes [provider]  Cyanocobalamin (B-12) 500 MCG TABS Take 500 mcg daily by mouth.    Yes [provider]  ferrous sulfate 325 (65 FE) MG tablet Take 325 mg by mouth daily as needed (fatigue).    Yes [provider]  Menthol, Topical Analgesic, (BIOFREEZE EX) Apply 1 application topically daily as needed (for pain).   Yes [provider]  Multiple Vitamin (MULTIVITAMIN WITH MINERALS) TABS tablet Take 1 tablet by mouth daily.   Yes [provider]  Naphazoline HCl (CLEAR EYES OP) Place 1 drop into both eyes daily as needed (for dry eyes).   Yes [provider]  Omega-3 Fatty Acids (SALMON OIL-1000 PO) Take 1,000 mg by mouth daily.   Yes [provider]  oxyCODONE (ROXICODONE) 15 MG immediate release tablet Take 15 mg by mouth 2 (two) times daily.  07/12/15  Yes [provider]  zolpidem (AMBIEN) 10 MG tablet Take 10 mg by mouth at bedtime.  04/29/11  Yes [provider]    Allergies as of 03/15/2018 - Review Complete 03/15/2018  Allergen Reaction Noted  . Ampicillin Other (See Comments)   . Iodine Swelling and Other (See Comments)     Family History  Problem Relation Age of Onset  . GI problems Sister        had colostomy (infection)  . Stroke Father 50  . Heart attack Father 97  . COPD Mother 80  . Hypertension Brother   . Diabetes Brother   . Colon cancer Neg Hx   . Inflammatory bowel disease Neg Hx     Social History    Socioeconomic History  . Marital status: Divorced    Spouse name: Not on file  . Number of children: Not on file  . Years of education: Not on file  . Highest education level: Not on file  Occupational History  . Occupation: Publishing copy  Social Needs  . Financial resource strain: Not on file  . Food insecurity:    Worry: Not on file    Inability: Not on file  . Transportation needs:    Medical: Not on file    Non-medical: Not on file  Tobacco Use  . Smoking status: Former Smoker    Types: Cigarettes    Last attempt to quit: 12/15/1973    Years since quitting: 44.3  . Smokeless tobacco: Never Used  . Tobacco comment: occas  Substance and Sexual Activity  . Alcohol use: No  . Drug use: No  . Sexual activity: Not on file  Lifestyle  . Physical activity:    Days per week: Not on file    Minutes per session: Not on file  . Stress: Not on file  Relationships  . Social connections:    Talks on phone: Not on file    Gets together: Not on file    Attends religious service: Not on file    Active member of club or organization: Not on file    Attends meetings of clubs or organizations: Not on file    Relationship status: Not on file  . Intimate partner violence:    Fear of current or ex partner: Not on file    Emotionally abused: Not on file    Physically abused: Not on file    Forced sexual activity: Not on file  Other Topics Concern  . Not on file  Social History Narrative   Has one son    Review of Systems: See HPI, otherwise negative ROS- Physical Exam: BP 134/63   Pulse 60   Temp 99.1 F (37.3 C) (Oral)   Resp 14   Ht 5' 2.5" (1.588 m)   Wt 139 lb (63 kg)   SpO2 99%   BMI 25.02 kg/m  General:   Alert,  Well-developed, well-nourished, pleasant and cooperative in NAD Mouth:  No deformity or lesions. Neck:  Supple; no masses or thyromegaly. No significant cervical adenopathy. Lungs:  Clear throughout to auscultation.   No wheezes, crackles, or rhonchi. No acute  distress. Heart:  Regular rate and rhythm; no murmurs, clicks, rubs,  or gallops. Abdomen: Non-distended, normal bowel sounds.  Soft and nontender without appreciable mass or hepatosplenomegaly.  Pulses:  Normal pulses noted. Extremities:  Without clubbing or edema.  Impression:  67 year old lady with abnormal CT suggestive of diverticulitis. Clinically doing well at this time. Previously recommended colonoscopy but  he has been put off on multiple occasions. Patient now here to get it done.  Recommendations:   I have recommended a diagnostic colonoscopy today per original plan.  The risks, benefits, limitations, alternatives and imponderables have been reviewed with the patient. Questions have been answered. All parties are agreeable.    Notice: This dictation was prepared with Dragon dictation along with smaller phrase technology. Any transcriptional errors that result from this process are unintentional and may not be corrected upon review.

## 2018-04-26 NOTE — Op Note (Signed)
[facility] Patient Name: [name] Procedure Date: 04/26/2018 7:17 AM MRN: [id number] Date of Birth: 08-26-1951 Attending MD: Norvel Richards , MD CSN: [id number] Age: 67 Admit Type: Outpatient Procedure:                Colonoscopy Indications:              Abnormal CT of the GI tract Providers:                Norvel Richards, MD, Lurline Del, RN, Randa Spike, Technician Referring MD:              Medicines:                Propofol per Anesthesia Complications:            No immediate complications. Estimated Blood Loss:     Estimated blood loss: none. Procedure:                Pre-Anesthesia Assessment:                           - Prior to the procedure, a History and Physical                            was performed, and patient medications and                            allergies were reviewed. The patient's tolerance of                            previous anesthesia was also reviewed. The risks                            and benefits of the procedure and the sedation                            options and risks were discussed with the patient.                            All questions were answered, and informed consent                            was obtained. Prior Anticoagulants: The patient has                            taken no previous anticoagulant or antiplatelet                            agents. ASA Grade Assessment: II - A patient with                            mild systemic disease. After reviewing the risks  and benefits, the patient was deemed in                            satisfactory condition to undergo the procedure.                           After obtaining informed consent, the colonoscope                            was passed under direct vision. Throughout the                            procedure, the patient's blood pressure, pulse, and                            oxygen saturations  were monitored continuously. The                            EC-[id number] ([id number]) scope was introduced through                            the and advanced to the the cecum, identified by                            appendiceal orifice and ileocecal valve. The                            colonoscopy was performed without difficulty. The                            patient tolerated the procedure well. The quality                            of the bowel preparation was adequate. The                            ileocecal valve, appendiceal orifice, and rectum                            were photographed. The entire colon was well                            visualized. The quality of the bowel preparation                            was adequate. Scope In: 7:45:01 AM Scope Out: 7:55:19 AM Scope Withdrawal Time: 0 hours 6 minutes 18 seconds  Total Procedure Duration: 0 hours 10 minutes 18 seconds  Findings:      The perianal and digital rectal examinations were normal.      Multiple small and large-mouthed diverticula were found in the entire       colon.      The exam was otherwise without abnormality on direct and retroflexion       views. Impression:               -  Diverticulosis in the entire examined colon.                           - The examination was otherwise normal on direct                            and retroflexion views.                           - No specimens collected. Moderate Sedation:      Moderate (conscious) sedation was personally administered by an       anesthesia professional. The following parameters were monitored: oxygen       saturation, heart rate, blood pressure, respiratory rate, EKG, adequacy       of pulmonary ventilation, and response to care. Total physician       intraservice time was 13 minutes. Recommendation:           - Patient has a contact number available for                            emergencies. The signs and symptoms of potential                             delayed complications were discussed with the                            patient. Return to normal activities tomorrow.                            Written discharge instructions were provided to the                            patient.                           - Advance diet as tolerated. No future colonoscopy                            unlesse symptoms develop. Consider taking a daily                            fiber supplement. Procedure Code(s):        --- Professional ---                           [phone], Colonoscopy, flexible; diagnostic, including                            collection of specimen(s) by brushing or washing,                            when performed (separate procedure) Diagnosis Code(s):        --- Professional ---                           K57.30, Diverticulosis of large intestine  without                            perforation or abscess without bleeding                           R93.3, Abnormal findings on diagnostic imaging of                            other parts of digestive tract CPT copyright 2017 American Medical Association. All rights reserved. The codes documented in this report are preliminary and upon coder review may  be revised to meet current compliance requirements. [name]. Brandy Mcconahy, MD Norvel Richards, MD 04/26/2018 8:07:53 AM This report has been signed electronically. Number of Addenda: 0

## 2018-04-26 NOTE — Anesthesia Preprocedure Evaluation (Signed)
Anesthesia Evaluation  Patient identified by MRN, date of birth, ID band Patient awake    Reviewed: Allergy & Precautions, H&P , NPO status , Patient's Chart, lab work & pertinent test results  Airway Mallampati: II  TM Distance: >3 FB Neck ROM: full    Dental no notable dental hx.    Pulmonary neg pulmonary ROS, former smoker,    Pulmonary exam normal breath sounds clear to auscultation       Cardiovascular Exercise Tolerance: Good negative cardio ROS  + dysrhythmias + pacemaker  Rhythm:regular Rate:Normal     Neuro/Psych Anxiety negative neurological ROS  negative psych ROS   GI/Hepatic negative GI ROS, Neg liver ROS,   Endo/Other  negative endocrine ROS  Renal/GU negative Renal ROS  negative genitourinary   Musculoskeletal   Abdominal   Peds  Hematology negative hematology ROS (+)   Anesthesia Other Findings Chronic back pain with narcotics  Reproductive/Obstetrics negative OB ROS                             Anesthesia Physical Anesthesia Plan  ASA: III  Anesthesia Plan: MAC   Post-op Pain Management:    Induction:   PONV Risk Score and Plan:   Airway Management Planned:   Additional Equipment:   Intra-op Plan:   Post-operative Plan:   Informed Consent: I have reviewed the patients History and Physical, chart, labs and discussed the procedure including the risks, benefits and alternatives for the proposed anesthesia with the patient or authorized representative who has indicated his/her understanding and acceptance.   Dental Advisory Given  Plan Discussed with: CRNA  Anesthesia Plan Comments:         Anesthesia Quick Evaluation

## 2018-04-29 ENCOUNTER — Encounter (HOSPITAL_COMMUNITY): Payer: Self-pay | Admitting: Internal Medicine

## 2018-05-05 DIAGNOSIS — Z6825 Body mass index (BMI) 25.0-25.9, adult: Secondary | ICD-10-CM | POA: Diagnosis not present

## 2018-05-05 DIAGNOSIS — I4891 Unspecified atrial fibrillation: Secondary | ICD-10-CM | POA: Diagnosis not present

## 2018-05-05 DIAGNOSIS — I700 Atherosclerosis of aorta: Secondary | ICD-10-CM | POA: Diagnosis not present

## 2018-05-05 DIAGNOSIS — K219 Gastro-esophageal reflux disease without esophagitis: Secondary | ICD-10-CM | POA: Diagnosis not present

## 2018-05-05 DIAGNOSIS — M1991 Primary osteoarthritis, unspecified site: Secondary | ICD-10-CM | POA: Diagnosis not present

## 2018-06-11 ENCOUNTER — Ambulatory Visit (INDEPENDENT_AMBULATORY_CARE_PROVIDER_SITE_OTHER): Payer: Medicare HMO | Admitting: *Deleted

## 2018-06-11 DIAGNOSIS — I495 Sick sinus syndrome: Secondary | ICD-10-CM

## 2018-06-11 LAB — CUP PACEART REMOTE DEVICE CHECK
Battery Remaining Longevity: 73 mo
Battery Remaining Percentage: 57 %
Battery Voltage: 2.89 V
Brady Statistic AP VP Percent: 1 %
Brady Statistic AP VS Percent: 46 %
Brady Statistic AS VP Percent: 1 %
Brady Statistic AS VS Percent: 54 %
Brady Statistic RA Percent Paced: 46 %
Brady Statistic RV Percent Paced: 1 %
Date Time Interrogation Session: 20190628061848
Implantable Lead Implant Date: 20120802
Implantable Lead Implant Date: 20120802
Implantable Lead Location: 753859
Implantable Lead Location: 753860
Implantable Pulse Generator Implant Date: 20120802
Lead Channel Impedance Value: 450 Ohm
Lead Channel Impedance Value: 460 Ohm
Lead Channel Pacing Threshold Amplitude: 0.5 V
Lead Channel Pacing Threshold Amplitude: 1.125 V
Lead Channel Pacing Threshold Pulse Width: 0.5 ms
Lead Channel Pacing Threshold Pulse Width: 0.5 ms
Lead Channel Sensing Intrinsic Amplitude: 1.9 mV
Lead Channel Sensing Intrinsic Amplitude: 5.1 mV
Lead Channel Setting Pacing Amplitude: 1.375
Lead Channel Setting Pacing Amplitude: 1.5 V
Lead Channel Setting Pacing Pulse Width: 0.5 ms
Lead Channel Setting Sensing Sensitivity: 1 mV
Pulse Gen Model: 2210
Pulse Gen Serial Number: 7250678

## 2018-06-11 NOTE — Progress Notes (Signed)
Remote pacemaker transmission.   

## 2018-07-07 DIAGNOSIS — L819 Disorder of pigmentation, unspecified: Secondary | ICD-10-CM | POA: Diagnosis not present

## 2018-07-07 DIAGNOSIS — L570 Actinic keratosis: Secondary | ICD-10-CM | POA: Diagnosis not present

## 2018-07-07 DIAGNOSIS — L816 Other disorders of diminished melanin formation: Secondary | ICD-10-CM | POA: Diagnosis not present

## 2018-07-07 DIAGNOSIS — L821 Other seborrheic keratosis: Secondary | ICD-10-CM | POA: Diagnosis not present

## 2018-08-04 DIAGNOSIS — F329 Major depressive disorder, single episode, unspecified: Secondary | ICD-10-CM | POA: Diagnosis not present

## 2018-08-04 DIAGNOSIS — Z6825 Body mass index (BMI) 25.0-25.9, adult: Secondary | ICD-10-CM | POA: Diagnosis not present

## 2018-08-04 DIAGNOSIS — N3281 Overactive bladder: Secondary | ICD-10-CM | POA: Diagnosis not present

## 2018-08-04 DIAGNOSIS — Z1389 Encounter for screening for other disorder: Secondary | ICD-10-CM | POA: Diagnosis not present

## 2018-08-04 DIAGNOSIS — M1991 Primary osteoarthritis, unspecified site: Secondary | ICD-10-CM | POA: Diagnosis not present

## 2018-08-04 DIAGNOSIS — R3129 Other microscopic hematuria: Secondary | ICD-10-CM | POA: Diagnosis not present

## 2018-08-04 DIAGNOSIS — R350 Frequency of micturition: Secondary | ICD-10-CM | POA: Diagnosis not present

## 2018-09-10 ENCOUNTER — Ambulatory Visit (INDEPENDENT_AMBULATORY_CARE_PROVIDER_SITE_OTHER): Payer: Medicare HMO | Admitting: *Deleted

## 2018-09-10 DIAGNOSIS — I495 Sick sinus syndrome: Secondary | ICD-10-CM

## 2018-09-10 NOTE — Progress Notes (Signed)
Remote pacemaker transmission.   

## 2018-09-13 ENCOUNTER — Encounter: Payer: Self-pay | Admitting: Cardiology

## 2018-09-14 ENCOUNTER — Ambulatory Visit: Payer: Medicare HMO | Admitting: Nurse Practitioner

## 2018-09-15 LAB — CUP PACEART REMOTE DEVICE CHECK
Battery Remaining Longevity: 65 mo
Battery Remaining Percentage: 51 %
Battery Voltage: 2.87 V
Brady Statistic AP VP Percent: 1 %
Brady Statistic AP VS Percent: 44 %
Brady Statistic AS VP Percent: 1 %
Brady Statistic AS VS Percent: 55 %
Brady Statistic RA Percent Paced: 44 %
Brady Statistic RV Percent Paced: 1 %
Date Time Interrogation Session: 20190927064932
Implantable Lead Implant Date: 20120802
Implantable Lead Implant Date: 20120802
Implantable Lead Location: 753859
Implantable Lead Location: 753860
Implantable Pulse Generator Implant Date: 20120802
Lead Channel Impedance Value: 450 Ohm
Lead Channel Impedance Value: 460 Ohm
Lead Channel Pacing Threshold Amplitude: 0.625 V
Lead Channel Pacing Threshold Amplitude: 1.25 V
Lead Channel Pacing Threshold Pulse Width: 0.5 ms
Lead Channel Pacing Threshold Pulse Width: 0.5 ms
Lead Channel Sensing Intrinsic Amplitude: 2.4 mV
Lead Channel Sensing Intrinsic Amplitude: 4.8 mV
Lead Channel Setting Pacing Amplitude: 1.5 V
Lead Channel Setting Pacing Amplitude: 1.625
Lead Channel Setting Pacing Pulse Width: 0.5 ms
Lead Channel Setting Sensing Sensitivity: 1 mV
Pulse Gen Model: 2210
Pulse Gen Serial Number: 7250678

## 2018-11-04 ENCOUNTER — Other Ambulatory Visit (HOSPITAL_COMMUNITY): Payer: Self-pay | Admitting: Internal Medicine

## 2018-11-04 DIAGNOSIS — Z1231 Encounter for screening mammogram for malignant neoplasm of breast: Secondary | ICD-10-CM

## 2018-11-10 ENCOUNTER — Ambulatory Visit (HOSPITAL_COMMUNITY): Payer: Self-pay

## 2018-11-16 DIAGNOSIS — E782 Mixed hyperlipidemia: Secondary | ICD-10-CM | POA: Diagnosis not present

## 2018-11-16 DIAGNOSIS — G894 Chronic pain syndrome: Secondary | ICD-10-CM | POA: Diagnosis not present

## 2018-11-16 DIAGNOSIS — G4700 Insomnia, unspecified: Secondary | ICD-10-CM | POA: Diagnosis not present

## 2018-11-16 DIAGNOSIS — I4891 Unspecified atrial fibrillation: Secondary | ICD-10-CM | POA: Diagnosis not present

## 2018-11-16 DIAGNOSIS — M1991 Primary osteoarthritis, unspecified site: Secondary | ICD-10-CM | POA: Diagnosis not present

## 2018-11-16 DIAGNOSIS — I2510 Atherosclerotic heart disease of native coronary artery without angina pectoris: Secondary | ICD-10-CM | POA: Diagnosis not present

## 2018-11-16 DIAGNOSIS — Z0001 Encounter for general adult medical examination with abnormal findings: Secondary | ICD-10-CM | POA: Diagnosis not present

## 2018-11-16 DIAGNOSIS — Z1389 Encounter for screening for other disorder: Secondary | ICD-10-CM | POA: Diagnosis not present

## 2018-11-24 ENCOUNTER — Encounter: Payer: Self-pay | Admitting: Cardiovascular Disease

## 2018-11-24 ENCOUNTER — Encounter (INDEPENDENT_AMBULATORY_CARE_PROVIDER_SITE_OTHER): Payer: Self-pay

## 2018-11-24 ENCOUNTER — Ambulatory Visit (INDEPENDENT_AMBULATORY_CARE_PROVIDER_SITE_OTHER): Payer: Medicare HMO | Admitting: Cardiovascular Disease

## 2018-11-24 VITALS — BP 126/64 | HR 60 | Ht 62.5 in | Wt 145.6 lb

## 2018-11-24 DIAGNOSIS — I471 Supraventricular tachycardia: Secondary | ICD-10-CM

## 2018-11-24 DIAGNOSIS — Z950 Presence of cardiac pacemaker: Secondary | ICD-10-CM

## 2018-11-24 DIAGNOSIS — I495 Sick sinus syndrome: Secondary | ICD-10-CM | POA: Diagnosis not present

## 2018-11-24 NOTE — Patient Instructions (Signed)
Medication Instructions:  Dr Sallyanne Kuster has recommended making the following medication changes: 1. STOP Aspirin  If you need a refill on your cardiac medications before your next appointment, please call your pharmacy.   Testing/Procedures: Remote monitoring is used to monitor your Pacemaker of ICD from home. This monitoring reduces the number of office visits required to check your device to one time per year. It allows [place] to keep an eye on the functioning of your device to ensure it is working properly. You are scheduled for a device check from home on Friday, December 27th, 2019. You may send your transmission at any time that day. If you have a wireless device, the transmission will be sent automatically. After your physician reviews your transmission, you will receive a postcard with your next transmission date.  To improve our patient care and to more adequately follow your device, [facility] has decided, as a practice, to start following each patient four times a year with your home monitor. This means that you may experience a remote appointment that is close to an in-office appointment with your physician. Your insurance will apply at the same rate as other remote monitoring transmissions.  Follow-Up: At [facility], you and your health needs are our priority.  As part of our continuing mission to provide you with exceptional heart care, we have created designated Provider Care Teams.  These Care Teams include your primary Cardiologist (physician) and Advanced Practice Providers (APPs -  Physician Assistants and Nurse Practitioners) who all work together to provide you with the care you need, when you need it. You will need a follow up appointment in 12 months.  Please call our office 2 months in advance to schedule this appointment.  You may see Sanda Klein, MD or one of the following Advanced Practice Providers on your designated Care Team: [place], [place] . Fabian Sharp, PA-C . You  will receive a reminder letter in the mail two months in advance. If you don't receive a letter, please call our office to schedule the follow-up appointment.

## 2018-11-24 NOTE — Progress Notes (Signed)
Cardiology Office Note    Date:  11/24/2018   ID:  ALASHA MCGUINNESS, DOB 1951-03-03, MRN [id number]  PCP:  Redmond School, MD  Cardiologist:   Sanda Klein, MD   Chief complaint: pacemaker check  History of Present Illness:  [name] is a 67 y.o. female with tachycardia-bradycardia syndrome (sinus bradycardia and paroxysmal atrial tachycardia) with a dual-chamber permanent pacemaker ([employer], implanted 2012) returning for pacemaker follow-up.   She generally feels well, only problem is occasional anxiety.  The patient specifically denies any chest pain at rest exertion, dyspnea at rest or with exertion, orthopnea, paroxysmal nocturnal dyspnea, syncope, palpitations, focal neurological deficits, intermittent claudication, lower extremity edema, unexplained weight gain, cough, hemoptysis or wheezing.  Interrogation of her pacemaker today shows normal device function. The estimated generator longevity is 4.6-5.1 years. There is only 43% atrial pacing and no ventricular pacing.  As in years past, she has a handful of episodes of atrial mode switch, less than once monthly, all paroxysmal atrial tachycardia, maximum 10 seconds    Past Medical History:  Diagnosis Date  . Anxiety   . Diverticulosis of colon   . Dysrhythmia    AFib  . History of kidney stones   . Osteoarthritis   . Osteoporosis   . Paroxysmal atrial fibrillation (HCC)    with SVT response  . Presence of permanent cardiac pacemaker   . Sinus node dysfunction (HCC)   . Tachy-brady syndrome [facility])     Past Surgical History:  Procedure Laterality Date  . ABDOMINAL HYSTERECTOMY    . APPENDECTOMY    . BREAST BIOPSY     right, benign  . CERVICAL SPINE SURGERY     two  . CHOLECYSTECTOMY    . COLONOSCOPY  2007   sigmoid diverticula, few ulcerations of terminal ileum, biopsies of TI unremarkable, random colon bx neg for microscopic colitis.  . COLONOSCOPY  06/04/11   Dr. Gala Romney- pegmentation of the  rectum, pancolonic diverticula, solitary ulcer in the mouth of the ileocecal valve, distal 15 cm of the terminal ileum appeared normal. Random colon biopsies were benign. Ileocecal valve ulcer or was benign without any supportive evidence of inflammatory bowel disease.  Marland Kitchen COLONOSCOPY WITH PROPOFOL N/A 04/26/2018   Procedure: COLONOSCOPY WITH PROPOFOL;  Surgeon: Daneil Dolin, MD;  [facility];  Service: Endoscopy;  Laterality: N/A;  7:30am  . ESOPHAGOGASTRODUODENOSCOPY  06/04/11   Dr. Vivi Ferns esophagus, deffuse petechial and gastric submucosal petechiae- benign mucousa on bx. Small bowel biopsy benign. No H. pylori.  Randolm Idol / REPLACE / REMOVE PACEMAKER    . PERMANENT PACEMAKER INSERTION  07/2011  . s/p hysterectomy    . sb capsule study  2009   Two single small nonbleeding AVMs in the  proximal small bowel, single lymphangiectasia in mid small bowel.  . vocal cord polypectomy      Current Medications: Outpatient Medications Prior to Visit  Medication Sig Dispense Refill  . acidophilus (RISAQUAD) CAPS capsule Take 1 capsule by mouth daily.    Marland Kitchen alendronate (FOSAMAX) 70 MG tablet Take 70 mg by mouth every Monday. Take with a full glass of water on an empty stomach.     . ALPRAZolam (XANAX) 0.5 MG tablet Take 0.5 mg by mouth daily as needed for anxiety.     Marland Kitchen aspirin 81 MG tablet Take 81 mg by mouth 2 (two) times a week.     . Cholecalciferol (VITAMIN D) 2000 units tablet Take 2,000 Units by  mouth daily.    . Cyanocobalamin (B-12) 500 MCG TABS Take 500 mcg daily by mouth.     . diclofenac sodium (VOLTAREN) 1 % GEL Apply 1 application topically daily as needed for pain.    . ferrous sulfate 325 (65 FE) MG tablet Take 325 mg by mouth daily as needed (fatigue).     Marland Kitchen FLUoxetine (PROZAC) 20 MG capsule Take 1 capsule by mouth daily.    . Menthol, Topical Analgesic, (BIOFREEZE EX) Apply 1 application topically daily as needed (for pain).    . Multiple Vitamin (MULTIVITAMIN WITH  MINERALS) TABS tablet Take 1 tablet by mouth daily.    . Omega-3 Fatty Acids (SALMON OIL-1000 PO) Take 1,000 mg by mouth daily.    Marland Kitchen oxyCODONE (ROXICODONE) 15 MG immediate release tablet Take 15 mg by mouth 2 (two) times daily.     Marland Kitchen zolpidem (AMBIEN) 10 MG tablet Take 10 mg by mouth at bedtime.     . Naphazoline HCl (CLEAR EYES OP) Place 1 drop into both eyes daily as needed (for dry eyes).     No facility-administered medications prior to visit.      Allergies:   Ampicillin and Iodine   Social History   Socioeconomic History  . Marital status: Divorced    Spouse name: Not on file  . Number of children: Not on file  . Years of education: Not on file  . Highest education level: Not on file  Occupational History  . Occupation: Publishing copy  Social Needs  . Financial resource strain: Not on file  . Food insecurity:    Worry: Not on file    Inability: Not on file  . Transportation needs:    Medical: Not on file    Non-medical: Not on file  Tobacco Use  . Smoking status: Former Smoker    Types: Cigarettes    Last attempt to quit: 12/15/1973    Years since quitting: 44.9  . Smokeless tobacco: Never Used  . Tobacco comment: occas  Substance and Sexual Activity  . Alcohol use: No  . Drug use: No  . Sexual activity: Not on file  Lifestyle  . Physical activity:    Days per week: Not on file    Minutes per session: Not on file  . Stress: Not on file  Relationships  . Social connections:    Talks on phone: Not on file    Gets together: Not on file    Attends religious service: Not on file    Active member of club or organization: Not on file    Attends meetings of clubs or organizations: Not on file    Relationship status: Not on file  Other Topics Concern  . Not on file  Social History Narrative   Has one son     Family History:  The patient's family history includes COPD (age of onset: 82) in her mother; Diabetes in her brother; GI problems in her sister; Heart attack (age  of onset: 47) in her father; Hypertension in her brother; Stroke (age of onset: 35) in her father.   ROS:   Please see the history of present illness.    ROS All other systems reviewed and are negative.   PHYSICAL EXAM:   VS:  BP 126/64   Pulse 60   Ht 5' 2.5" (1.588 m)   Wt 145 lb 9.6 oz (66 kg)   BMI 26.21 kg/m     General: Alert, oriented x3, no distress, healthy L subclavian  PM site. Head: no evidence of trauma, PERRL, EOMI, no exophtalmos or lid lag, no myxedema, no xanthelasma; normal ears, nose and oropharynx Neck: normal jugular venous pulsations and no hepatojugular reflux; brisk carotid pulses without delay and no carotid bruits Chest: clear to auscultation, no signs of consolidation by percussion or palpation, normal fremitus, symmetrical and full respiratory excursions Cardiovascular: normal position and quality of the apical impulse, regular rhythm, normal first and second heart sounds, no murmurs, rubs or gallops Abdomen: no tenderness or distention, no masses by palpation, no abnormal pulsatility or arterial bruits, normal bowel sounds, no hepatosplenomegaly Extremities: no clubbing, cyanosis or edema; 2+ radial, ulnar and brachial pulses bilaterally; 2+ right femoral, posterior tibial and dorsalis pedis pulses; 2+ left femoral, posterior tibial and dorsalis pedis pulses; no subclavian or femoral bruits Neurological: grossly nonfocal Psych: Normal mood and affect   Wt Readings from Last 3 Encounters:  11/24/18 145 lb 9.6 oz (66 kg)  04/26/18 139 lb (63 kg)  03/15/18 139 lb 6.4 oz (63.2 kg)      Studies/Labs Reviewed:   EKG:  EKG is ordered today.  The ekg ordered today demonstrates A paced, V sensed rhythm, nonspecific T wave flattening.  Recent Labs: 02/24/2018: BUN 10; Creatinine, Ser 0.71; Hemoglobin 12.4; Platelets 201; Potassium 4.1; Sodium 140   Lipid Panel    Component Value Date/Time   CHOL 156 07/17/2011 0605   TRIG 107 07/17/2011 0605   HDL 52  07/17/2011 0605   CHOLHDL 3.0 07/17/2011 0605   VLDL 21 07/17/2011 0605   LDLCALC 83 07/17/2011 0605     ASSESSMENT:    1. SSS (sick sinus syndrome) ([place])   2. PAT (paroxysmal atrial tachycardia) ([place])   3. Pacemaker      PLAN:  In order of problems listed above:  1. SSS: Heart rate histograms appear appropriate for activity level. 2. PAT: brief, rare, asymptomatic episodes - no therapy necessary 3. PPM: Normal function, continue remote checks.   Medication Adjustments/Labs and Tests Ordered: Current medicines are reviewed at length with the patient today.  Concerns regarding medicines are outlined above.  Medication changes, Labs and Tests ordered today are listed in the Patient Instructions below. Patient Instructions  Medication Instructions:  Dr Sallyanne Kuster has recommended making the following medication changes: 1. STOP Aspirin  If you need a refill on your cardiac medications before your next appointment, please call your pharmacy.   Testing/Procedures: Remote monitoring is used to monitor your Pacemaker of ICD from home. This monitoring reduces the number of office visits required to check your device to one time per year. It allows [place] to keep an eye on the functioning of your device to ensure it is working properly. You are scheduled for a device check from home on Friday, December 27th, 2019. You may send your transmission at any time that day. If you have a wireless device, the transmission will be sent automatically. After your physician reviews your transmission, you will receive a postcard with your next transmission date.  To improve our patient care and to more adequately follow your device, [facility] has decided, as a practice, to start following each patient four times a year with your home monitor. This means that you may experience a remote appointment that is close to an in-office appointment with your physician. Your insurance will apply at the same rate as  other remote monitoring transmissions.  Follow-Up: At [facility], you and your health needs are our priority.  As part of our  continuing mission to provide you with exceptional heart care, we have created designated Provider Care Teams.  These Care Teams include your primary Cardiologist (physician) and Advanced Practice Providers (APPs -  Physician Assistants and Nurse Practitioners) who all work together to provide you with the care you need, when you need it. You will need a follow up appointment in 12 months.  Please call our office 2 months in advance to schedule this appointment.  You may see Sanda Klein, MD or one of the following Advanced Practice Providers on your designated Care Team: [place], [place] . Fabian Sharp, PA-C . You will receive a reminder letter in the mail two months in advance. If you don't receive a letter, please call our office to schedule the follow-up appointment.     Signed, Sanda Klein, MD  11/24/2018 8:37 AM    [facility] [address], [place], [place]  [address] Phone: [phone]; Fax: [phone] ,mg

## 2018-11-24 NOTE — Addendum Note (Signed)
Addended by: Diana Eves on: 11/24/2018 08:47 AM   Modules accepted: Orders

## 2018-11-25 NOTE — Addendum Note (Signed)
Addended by: Diana Eves on: 11/25/2018 01:44 PM   Modules accepted: Orders

## 2018-12-10 ENCOUNTER — Ambulatory Visit (INDEPENDENT_AMBULATORY_CARE_PROVIDER_SITE_OTHER): Payer: Medicare HMO

## 2018-12-10 ENCOUNTER — Telehealth: Payer: Self-pay

## 2018-12-10 DIAGNOSIS — I495 Sick sinus syndrome: Secondary | ICD-10-CM

## 2018-12-10 NOTE — Telephone Encounter (Signed)
Left message for patient to remind of missed remote transmission.  

## 2018-12-12 LAB — CUP PACEART REMOTE DEVICE CHECK
Battery Remaining Longevity: 58 mo
Battery Remaining Percentage: 46 %
Battery Voltage: 2.86 V
Brady Statistic AP VP Percent: 1 %
Brady Statistic AP VS Percent: 43 %
Brady Statistic AS VP Percent: 1 %
Brady Statistic AS VS Percent: 57 %
Brady Statistic RA Percent Paced: 43 %
Brady Statistic RV Percent Paced: 1 %
Date Time Interrogation Session: 20191227195626
Implantable Lead Implant Date: 20120802
Implantable Lead Implant Date: 20120802
Implantable Lead Location: 753859
Implantable Lead Location: 753860
Implantable Pulse Generator Implant Date: 20120802
Lead Channel Impedance Value: 410 Ohm
Lead Channel Impedance Value: 450 Ohm
Lead Channel Pacing Threshold Amplitude: 0.5 V
Lead Channel Pacing Threshold Amplitude: 1 V
Lead Channel Pacing Threshold Pulse Width: 0.5 ms
Lead Channel Pacing Threshold Pulse Width: 0.5 ms
Lead Channel Sensing Intrinsic Amplitude: 2.2 mV
Lead Channel Sensing Intrinsic Amplitude: 5.2 mV
Lead Channel Setting Pacing Amplitude: 1.25 V
Lead Channel Setting Pacing Amplitude: 1.5 V
Lead Channel Setting Pacing Pulse Width: 0.5 ms
Lead Channel Setting Sensing Sensitivity: 1 mV
Pulse Gen Model: 2210
Pulse Gen Serial Number: 7250678

## 2018-12-13 NOTE — Progress Notes (Signed)
Remote pacemaker transmission.   

## 2018-12-14 ENCOUNTER — Encounter: Payer: Self-pay | Admitting: Cardiology

## 2018-12-21 ENCOUNTER — Encounter: Payer: Self-pay | Admitting: Nurse Practitioner

## 2018-12-21 ENCOUNTER — Ambulatory Visit (INDEPENDENT_AMBULATORY_CARE_PROVIDER_SITE_OTHER): Payer: Medicare HMO | Admitting: Nurse Practitioner

## 2018-12-21 VITALS — BP 107/64 | HR 78 | Temp 97.0°F | Ht 62.0 in | Wt 146.6 lb

## 2018-12-21 DIAGNOSIS — R143 Flatulence: Secondary | ICD-10-CM | POA: Diagnosis not present

## 2018-12-21 DIAGNOSIS — R142 Eructation: Secondary | ICD-10-CM

## 2018-12-21 NOTE — Assessment & Plan Note (Signed)
The patient complains of excessive flatulence.  Minimal dairy intake.  She is tried antacids which have not helped.  She is also having increased belching and may be due to air swallowing.  We have discussed taking her time and slowing down when eating.  She is also recently stopped taking her probiotic.  I will have her restart probiotic and trial over-the-counter simethicone.  If she continues to have significant flatulence we can consider sending her for testing for small intestinal bacterial overgrowth.  Return for follow-up in 6 months.

## 2018-12-21 NOTE — Progress Notes (Signed)
Referring Provider: Redmond School, MD Primary Care Physician:  Redmond School, MD Primary GI:  Dr. Gala Romney  Chief Complaint  Patient presents with  . Diarrhea    no longer an issue  . belching    lots of it  . Gas    lots of it    HPI:   [name] is a 68 y.o. female who presents for follow-up on diarrhea and belching.  Patient was last seen in our office 03/15/2018 for diarrhea, acute diverticulitis, belching.  History of ileocecal valve ulcer felt due to to aspirin which resolved with Entocort.  At her last visit she was doing better on a probiotic and diarrhea had resolved.  Feels that that helped a lot.  Having stools daily consistently [place] 4.  Does have a lot of belching and gas.  No GERD symptoms.  No other GI symptoms.  She does indicate a high stress level personally.  Recommended colonoscopy due to recent acute diverticulitis, trigger food avoidance, follow-up based on post procedure recommendations.  Colonoscopy is completed 04/26/2018 which found normal rectum, multiple small and large mouth diverticula in the entire examined colon, otherwise normal.  Recommended no future colonoscopy unless symptoms develop, consider daily fiber supplement.  Today she states she's doing ok overall. No further diarrhea. Is having significant belching and fluctuance. Has tried OTC antacids which didn't help. Is not on probiotic currently. Drinks a lot of water, occasional carbonated beverages. Does not smoke. Doesn't chew gum. Minimal dairy intake, typically yogurt. Denies abdominal pain, N/V, hematochezia, melena, fever, chills, unintentional weight loss. Denies chest pain, dyspnea, dizziness, lightheadedness, syncope, near syncope. Denies any other upper or lower GI symptoms.  Past Medical History:  Diagnosis Date  . Anxiety   . Diverticulosis of colon   . Dysrhythmia    AFib  . History of kidney stones   . Osteoarthritis   . Osteoporosis   . Paroxysmal atrial fibrillation (HCC)     with SVT response  . Presence of permanent cardiac pacemaker   . Sinus node dysfunction (HCC)   . Tachy-brady syndrome [facility])     Past Surgical History:  Procedure Laterality Date  . ABDOMINAL HYSTERECTOMY    . APPENDECTOMY    . BREAST BIOPSY     right, benign  . CERVICAL SPINE SURGERY     two  . CHOLECYSTECTOMY    . COLONOSCOPY  2007   sigmoid diverticula, few ulcerations of terminal ileum, biopsies of TI unremarkable, random colon bx neg for microscopic colitis.  . COLONOSCOPY  06/04/11   Dr. Gala Romney- pegmentation of the rectum, pancolonic diverticula, solitary ulcer in the mouth of the ileocecal valve, distal 15 cm of the terminal ileum appeared normal. Random colon biopsies were benign. Ileocecal valve ulcer or was benign without any supportive evidence of inflammatory bowel disease.  Marland Kitchen COLONOSCOPY WITH PROPOFOL N/A 04/26/2018   Procedure: COLONOSCOPY WITH PROPOFOL;  Surgeon: Daneil Dolin, MD;  Location[facility];  Service: Endoscopy;  Laterality: N/A;  7:30am  . ESOPHAGOGASTRODUODENOSCOPY  06/04/11   Dr. Vivi Ferns esophagus, deffuse petechial and gastric submucosal petechiae- benign mucousa on bx. Small bowel biopsy benign. No H. pylori.  Randolm Idol / REPLACE / REMOVE PACEMAKER    . PERMANENT PACEMAKER INSERTION  07/2011  . s/p hysterectomy    . sb capsule study  2009   Two single small nonbleeding AVMs in the  proximal small bowel, single lymphangiectasia in mid small bowel.  . vocal cord polypectomy  Current Outpatient Medications  Medication Sig Dispense Refill  . alendronate (FOSAMAX) 70 MG tablet Take 70 mg by mouth every Monday. Take with a full glass of water on an empty stomach.     . Cholecalciferol (VITAMIN D) 2000 units tablet Take 2,000 Units by mouth daily.    . Cyanocobalamin (B-12) 500 MCG TABS Take 500 mcg daily by mouth.     . diclofenac sodium (VOLTAREN) 1 % GEL Apply 1 application topically daily as needed for pain.    . ferrous sulfate 325 (65  FE) MG tablet Take 325 mg by mouth daily as needed (fatigue).     Marland Kitchen FLUoxetine (PROZAC) 20 MG capsule Take 1 capsule by mouth daily.    . Multiple Vitamin (MULTIVITAMIN WITH MINERALS) TABS tablet Take 1 tablet by mouth daily.    . Omega-3 Fatty Acids (SALMON OIL-1000 PO) Take 1,000 mg by mouth daily.    Marland Kitchen oxyCODONE (ROXICODONE) 15 MG immediate release tablet Take 15 mg by mouth 2 (two) times daily.     Marland Kitchen zolpidem (AMBIEN) 10 MG tablet Take 10 mg by mouth at bedtime.     Marland Kitchen acidophilus (RISAQUAD) CAPS capsule Take 1 capsule by mouth daily.     No current facility-administered medications for this visit.     Allergies as of 12/21/2018 - Review Complete 12/21/2018  Allergen Reaction Noted  . Ampicillin Other (See Comments)   . Iodine Swelling and Other (See Comments)     Family History  Problem Relation Age of Onset  . GI problems Sister        had colostomy (infection)  . Stroke Father 42  . Heart attack Father 52  . COPD Mother 53  . Hypertension Brother   . Diabetes Brother   . Colon cancer Neg Hx   . Inflammatory bowel disease Neg Hx     Social History   Socioeconomic History  . Marital status: Divorced    Spouse name: Not on file  . Number of children: Not on file  . Years of education: Not on file  . Highest education level: Not on file  Occupational History  . Occupation: Publishing copy  Social Needs  . Financial resource strain: Not on file  . Food insecurity:    Worry: Not on file    Inability: Not on file  . Transportation needs:    Medical: Not on file    Non-medical: Not on file  Tobacco Use  . Smoking status: Former Smoker    Types: Cigarettes    Last attempt to quit: 12/15/1973    Years since quitting: 45.0  . Smokeless tobacco: Never Used  . Tobacco comment: occas  Substance and Sexual Activity  . Alcohol use: No  . Drug use: No  . Sexual activity: Not on file  Lifestyle  . Physical activity:    Days per week: Not on file    Minutes per session: Not on  file  . Stress: Not on file  Relationships  . Social connections:    Talks on phone: Not on file    Gets together: Not on file    Attends religious service: Not on file    Active member of club or organization: Not on file    Attends meetings of clubs or organizations: Not on file    Relationship status: Not on file  Other Topics Concern  . Not on file  Social History Narrative   Has one son    Review of Systems: General: Negative  for anorexia, weight loss, fever, chills, fatigue, weakness. ENT: Negative for hoarseness, difficulty swallowing. CV: Negative for chest pain, angina, palpitations, peripheral edema.  Respiratory: Negative for dyspnea at rest, cough, sputum, wheezing.  GI: See history of present illness. Endo: Negative for unusual weight change.  Heme: Negative for bruising or bleeding. Allergy: Negative for rash or hives.   Physical Exam: BP 107/64   Pulse 78   Temp (!) 97 F (36.1 C) (Oral)   Ht 5\' 2"  (1.575 m)   Wt 146 lb 9.6 oz (66.5 kg)   BMI 26.81 kg/m  General:   Alert and oriented. Pleasant and cooperative. Well-nourished and well-developed.  Eyes:  Without icterus, sclera clear and conjunctiva pink.  Ears:  Normal auditory acuity. Cardiovascular:  S1, S2 present without murmurs appreciated. Extremities without clubbing or edema. Respiratory:  Clear to auscultation bilaterally. No wheezes, rales, or rhonchi. No distress.  Gastrointestinal:  +BS, soft, non-tender and non-distended. No HSM noted. No guarding or rebound. No masses appreciated.  Rectal:  Deferred  Musculoskalatal:  Symmetrical without gross deformities. Neurologic:  Alert and oriented x4;  grossly normal neurologically. Psych:  Alert and cooperative. Normal mood and affect. Heme/Lymph/Immune: No excessive bruising noted.    12/21/2018 11:21 AM   Disclaimer: This note was dictated with voice recognition software. Similar sounding words can inadvertently be transcribed and may not be  corrected upon review.

## 2018-12-21 NOTE — Patient Instructions (Signed)
1. Restart taking your probiotic. 2. Try taking over-the-counter simethicone (one example brand name is Gas-X). 3. I am giving you printed information on a "low FODMAP diet".  If the over-the-counter medications and probiotics do not help, you can try this diet to see if it helps.  This is often help people with excessive flatulence and belching. 4. Return for follow-up in 6 months. 5. Call us if you have any questions or concerns.  At [facility] we value your feedback. You may receive a survey about your visit today. Please share your experience as we strive to create trusting relationships with our patients to provide genuine, compassionate, quality care.  We appreciate your understanding and patience as we review any laboratory studies, imaging, and other diagnostic tests that are ordered as we care for you. Our office policy is 5 business days for review of these results, and any emergent or urgent results are addressed in a timely manner for your best interest. If you do not hear from our office in 1 week, please contact us.   We also encourage the use of MyChart, which contains your medical information for your review as well. If you are not enrolled in this feature, an access code is on this after visit summary for your convenience. Thank you for allowing [place] to be involved in your care.  It was great to see you today!  I hope you have a great start to the new year!!

## 2018-12-21 NOTE — Assessment & Plan Note (Signed)
The patient notes excessive belching.  We have discussed slowing down when eating to avoid inadvertent air swallowing.  She does not chew gum, drink much carbonated beverages, or smoke.  I will have her restart her probiotic, trial simethicone over-the-counter.  I will also give her a handout for low FODMAP diet.  If probiotic and simethicone did not help she can trial FODMAP diet to see if this causes any improvement.  Return for follow-up in 6 months.

## 2018-12-21 NOTE — Progress Notes (Signed)
cc'ed to pcp °

## 2018-12-27 DIAGNOSIS — Z6826 Body mass index (BMI) 26.0-26.9, adult: Secondary | ICD-10-CM | POA: Diagnosis not present

## 2018-12-27 DIAGNOSIS — L309 Dermatitis, unspecified: Secondary | ICD-10-CM | POA: Diagnosis not present

## 2018-12-27 DIAGNOSIS — E063 Autoimmune thyroiditis: Secondary | ICD-10-CM | POA: Diagnosis not present

## 2018-12-27 DIAGNOSIS — Z1389 Encounter for screening for other disorder: Secondary | ICD-10-CM | POA: Diagnosis not present

## 2018-12-27 DIAGNOSIS — L0100 Impetigo, unspecified: Secondary | ICD-10-CM | POA: Diagnosis not present

## 2018-12-27 DIAGNOSIS — F419 Anxiety disorder, unspecified: Secondary | ICD-10-CM | POA: Diagnosis not present

## 2019-01-04 LAB — CUP PACEART INCLINIC DEVICE CHECK
Date Time Interrogation Session: 20200121142109
Implantable Lead Implant Date: 20120802
Implantable Lead Implant Date: 20120802
Implantable Lead Location: 753859
Implantable Lead Location: 753860
Implantable Pulse Generator Implant Date: 20120802
Pulse Gen Model: 2210
Pulse Gen Serial Number: 7250678

## 2019-01-24 DIAGNOSIS — Z6826 Body mass index (BMI) 26.0-26.9, adult: Secondary | ICD-10-CM | POA: Diagnosis not present

## 2019-01-24 DIAGNOSIS — E663 Overweight: Secondary | ICD-10-CM | POA: Diagnosis not present

## 2019-01-24 DIAGNOSIS — J9801 Acute bronchospasm: Secondary | ICD-10-CM | POA: Diagnosis not present

## 2019-01-24 DIAGNOSIS — J329 Chronic sinusitis, unspecified: Secondary | ICD-10-CM | POA: Diagnosis not present

## 2019-01-24 DIAGNOSIS — G894 Chronic pain syndrome: Secondary | ICD-10-CM | POA: Diagnosis not present

## 2019-01-24 DIAGNOSIS — I700 Atherosclerosis of aorta: Secondary | ICD-10-CM | POA: Diagnosis not present

## 2019-03-09 ENCOUNTER — Other Ambulatory Visit: Payer: Self-pay | Admitting: Cardiovascular Disease

## 2019-03-14 ENCOUNTER — Ambulatory Visit (INDEPENDENT_AMBULATORY_CARE_PROVIDER_SITE_OTHER): Payer: Medicare HMO | Admitting: *Deleted

## 2019-03-14 ENCOUNTER — Other Ambulatory Visit: Payer: Self-pay

## 2019-03-14 DIAGNOSIS — I495 Sick sinus syndrome: Secondary | ICD-10-CM

## 2019-03-15 DIAGNOSIS — G894 Chronic pain syndrome: Secondary | ICD-10-CM | POA: Diagnosis not present

## 2019-03-15 DIAGNOSIS — E663 Overweight: Secondary | ICD-10-CM | POA: Diagnosis not present

## 2019-03-15 DIAGNOSIS — Z6826 Body mass index (BMI) 26.0-26.9, adult: Secondary | ICD-10-CM | POA: Diagnosis not present

## 2019-03-15 DIAGNOSIS — M5000 Cervical disc disorder with myelopathy, unspecified cervical region: Secondary | ICD-10-CM | POA: Diagnosis not present

## 2019-03-15 LAB — CUP PACEART REMOTE DEVICE CHECK
Battery Remaining Longevity: 58 mo
Battery Remaining Percentage: 46 %
Battery Voltage: 2.86 V
Brady Statistic AP VP Percent: 1 %
Brady Statistic AP VS Percent: 43 %
Brady Statistic AS VP Percent: 1 %
Brady Statistic AS VS Percent: 57 %
Brady Statistic RA Percent Paced: 43 %
Brady Statistic RV Percent Paced: 1 %
Date Time Interrogation Session: 20200330060007
Implantable Lead Implant Date: 20120802
Implantable Lead Implant Date: 20120802
Implantable Lead Location: 753859
Implantable Lead Location: 753860
Implantable Pulse Generator Implant Date: 20120802
Lead Channel Impedance Value: 460 Ohm
Lead Channel Impedance Value: 460 Ohm
Lead Channel Pacing Threshold Amplitude: 0.5 V
Lead Channel Pacing Threshold Amplitude: 1.25 V
Lead Channel Pacing Threshold Pulse Width: 0.5 ms
Lead Channel Pacing Threshold Pulse Width: 0.5 ms
Lead Channel Sensing Intrinsic Amplitude: 1.9 mV
Lead Channel Sensing Intrinsic Amplitude: 5.4 mV
Lead Channel Setting Pacing Amplitude: 1.5 V
Lead Channel Setting Pacing Amplitude: 1.5 V
Lead Channel Setting Pacing Pulse Width: 0.5 ms
Lead Channel Setting Sensing Sensitivity: 1 mV
Pulse Gen Model: 2210
Pulse Gen Serial Number: 7250678

## 2019-03-23 NOTE — Progress Notes (Signed)
Remote pacemaker transmission.   

## 2019-04-10 IMAGING — DX DG CHEST 2V
2 series · 2 of 2 positions shown · non-contrast
Comparison: Chest radiograph dated 07/18/2011

CLINICAL DATA: 66-year-old female with anxiety and shortness of
breath.

EXAM:
CHEST - 2 VIEW

[chest pa]
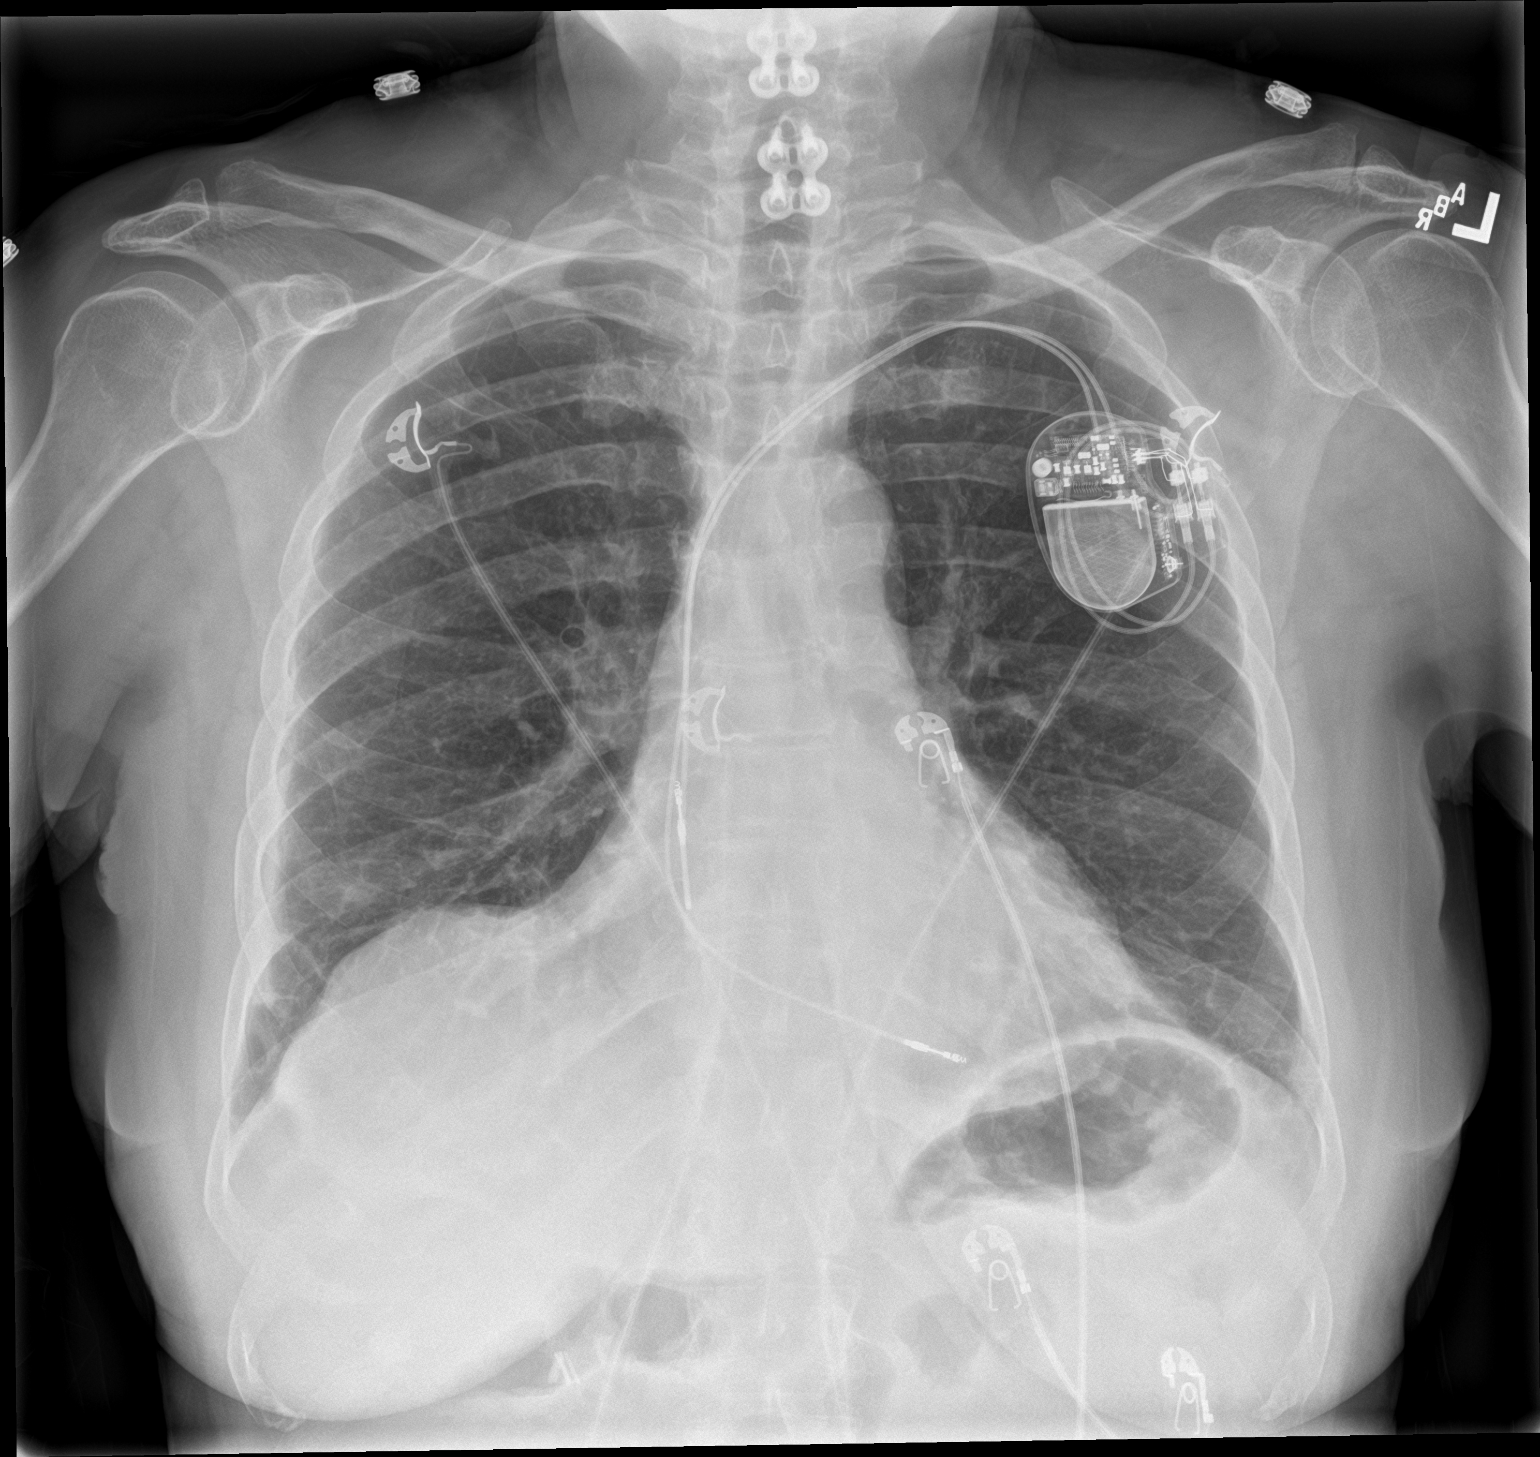

[chest lat]
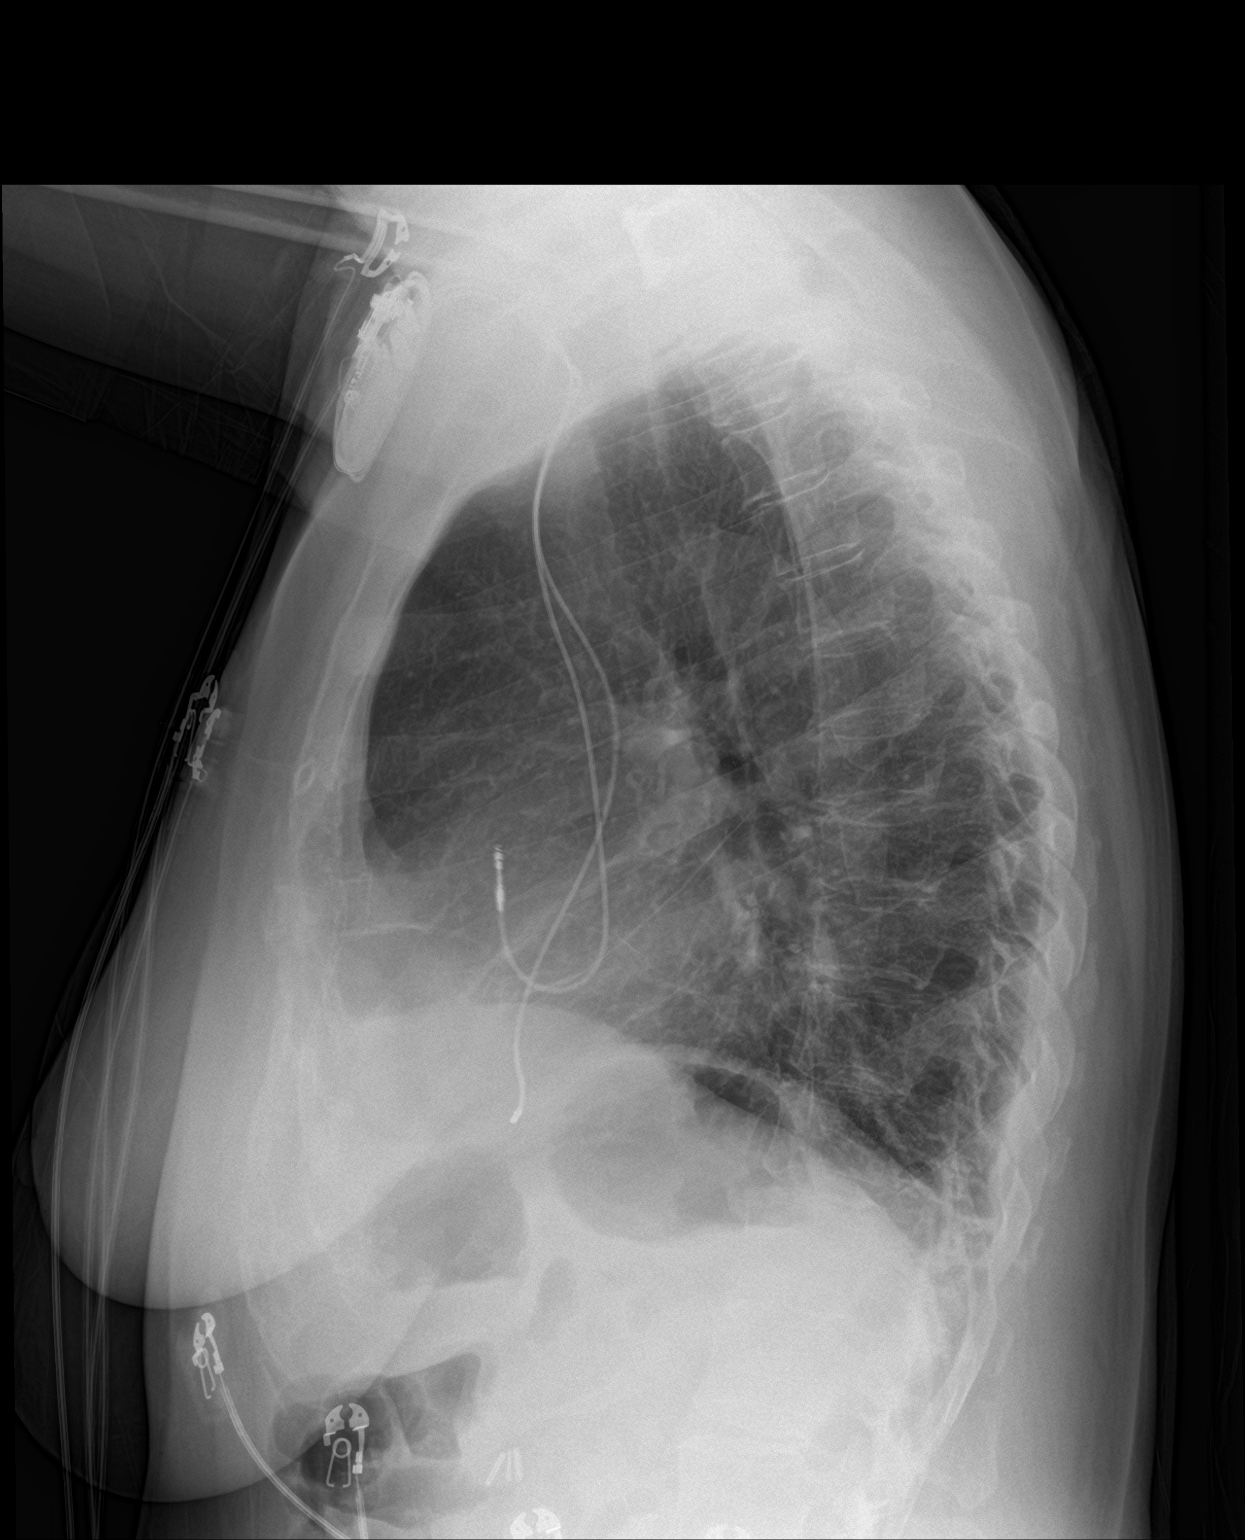

[2 of 2 positions shown; findings below may reference images not displayed]

FINDINGS: There is mild diffuse interstitial coarsening and bibasilar
atelectatic changes/scarring. No focal consolidation, pleural
effusion, or pneumothorax. The cardiac silhouette is within normal
limits. Left pectoral pacemaker device. No acute osseous pathology.
IMPRESSION: No active cardiopulmonary disease.

## 2019-04-26 DIAGNOSIS — G894 Chronic pain syndrome: Secondary | ICD-10-CM | POA: Diagnosis not present

## 2019-04-26 DIAGNOSIS — Z0001 Encounter for general adult medical examination with abnormal findings: Secondary | ICD-10-CM | POA: Diagnosis not present

## 2019-04-26 DIAGNOSIS — R7309 Other abnormal glucose: Secondary | ICD-10-CM | POA: Diagnosis not present

## 2019-04-26 DIAGNOSIS — Z1389 Encounter for screening for other disorder: Secondary | ICD-10-CM | POA: Diagnosis not present

## 2019-04-26 DIAGNOSIS — Z6826 Body mass index (BMI) 26.0-26.9, adult: Secondary | ICD-10-CM | POA: Diagnosis not present

## 2019-04-26 DIAGNOSIS — E663 Overweight: Secondary | ICD-10-CM | POA: Diagnosis not present

## 2019-06-13 ENCOUNTER — Ambulatory Visit (INDEPENDENT_AMBULATORY_CARE_PROVIDER_SITE_OTHER): Payer: Medicare HMO | Admitting: *Deleted

## 2019-06-13 DIAGNOSIS — Z6826 Body mass index (BMI) 26.0-26.9, adult: Secondary | ICD-10-CM | POA: Diagnosis not present

## 2019-06-13 DIAGNOSIS — E663 Overweight: Secondary | ICD-10-CM | POA: Diagnosis not present

## 2019-06-13 DIAGNOSIS — G894 Chronic pain syndrome: Secondary | ICD-10-CM | POA: Diagnosis not present

## 2019-06-13 DIAGNOSIS — G4700 Insomnia, unspecified: Secondary | ICD-10-CM | POA: Diagnosis not present

## 2019-06-13 DIAGNOSIS — I495 Sick sinus syndrome: Secondary | ICD-10-CM | POA: Diagnosis not present

## 2019-06-13 LAB — CUP PACEART REMOTE DEVICE CHECK
Battery Remaining Longevity: 51 mo
Battery Remaining Percentage: 40 %
Battery Voltage: 2.84 V
Brady Statistic AP VP Percent: 1 %
Brady Statistic AP VS Percent: 42 %
Brady Statistic AS VP Percent: 1 %
Brady Statistic AS VS Percent: 57 %
Brady Statistic RA Percent Paced: 42 %
Brady Statistic RV Percent Paced: 1 %
Date Time Interrogation Session: 20200629060009
Implantable Lead Implant Date: 20120802
Implantable Lead Implant Date: 20120802
Implantable Lead Location: 753859
Implantable Lead Location: 753860
Implantable Pulse Generator Implant Date: 20120802
Lead Channel Impedance Value: 450 Ohm
Lead Channel Impedance Value: 450 Ohm
Lead Channel Pacing Threshold Amplitude: 0.5 V
Lead Channel Pacing Threshold Amplitude: 1.25 V
Lead Channel Pacing Threshold Pulse Width: 0.5 ms
Lead Channel Pacing Threshold Pulse Width: 0.5 ms
Lead Channel Sensing Intrinsic Amplitude: 1.6 mV
Lead Channel Sensing Intrinsic Amplitude: 3.4 mV
Lead Channel Setting Pacing Amplitude: 1.5 V
Lead Channel Setting Pacing Amplitude: 1.5 V
Lead Channel Setting Pacing Pulse Width: 0.5 ms
Lead Channel Setting Sensing Sensitivity: 1 mV
Pulse Gen Model: 2210
Pulse Gen Serial Number: 7250678

## 2019-06-15 ENCOUNTER — Other Ambulatory Visit: Payer: Self-pay

## 2019-06-15 DIAGNOSIS — Z20822 Contact with and (suspected) exposure to covid-19: Secondary | ICD-10-CM

## 2019-06-15 DIAGNOSIS — R6889 Other general symptoms and signs: Secondary | ICD-10-CM | POA: Diagnosis not present

## 2019-06-19 LAB — NOVEL CORONAVIRUS, NAA: SARS-CoV-2, NAA: NOT DETECTED

## 2019-06-21 ENCOUNTER — Other Ambulatory Visit: Payer: Self-pay

## 2019-06-21 ENCOUNTER — Encounter: Payer: Self-pay | Admitting: Gastroenterology

## 2019-06-21 ENCOUNTER — Telehealth: Payer: Self-pay | Admitting: Gastroenterology

## 2019-06-21 ENCOUNTER — Ambulatory Visit: Payer: Medicare HMO | Admitting: Gastroenterology

## 2019-06-21 VITALS — BP 113/67 | HR 74 | Temp 97.9°F | Ht 62.0 in | Wt 142.2 lb

## 2019-06-21 DIAGNOSIS — R1013 Epigastric pain: Secondary | ICD-10-CM

## 2019-06-21 DIAGNOSIS — R142 Eructation: Secondary | ICD-10-CM

## 2019-06-21 DIAGNOSIS — R140 Abdominal distension (gaseous): Secondary | ICD-10-CM | POA: Insufficient documentation

## 2019-06-21 DIAGNOSIS — K219 Gastro-esophageal reflux disease without esophagitis: Secondary | ICD-10-CM

## 2019-06-21 MED ORDER — PANTOPRAZOLE SODIUM 40 MG PO TBEC: 40.0000 mg | DELAYED_RELEASE_TABLET | Freq: Every day | ORAL | 1 refills | Status: DC

## 2019-06-21 NOTE — Telephone Encounter (Signed)
Were we able to provide patient with information needed to determine if our anesthesiology group at [facility] is in her insurance network?  Patient reports colonoscopy last year left her with a large anesthesiology view because the anesthesiologist was not in her insurance network it was not covered.

## 2019-06-21 NOTE — Progress Notes (Signed)
Primary Care Physician: Redmond School, MD  Primary Gastroenterologist:  Garfield Cornea, MD   Chief Complaint  Patient presents with  . belching    excessive belching constantly even with water, feels "hot water" come back in throat and just burns  . Gas  . Bloated    with eating makes her look "8 months pregnant"    HPI: [name] is a 68 y.o. female here for persistent UGI symptoms. Seen back in 12/2018 with similar symptoms. Adding probiotic, OTC simethicone, low FODMAP diet didn't help.   Complains of daily postprandial abdominal bloating with discomfort, excessive belching and flatulence. Regurgitation on occasion.  Typical daily heartburn.  No dysphagia.  Bowel movements are regular.  No blood in the stool or melena.  Does not take fosamax regularly like she should.  Instead she takes her vitamins, vitamin D, calcium, B12.  She is on oxycodone twice daily since her hip fracture couple of years back.     Current Outpatient Medications  Medication Sig Dispense Refill  . acidophilus (RISAQUAD) CAPS capsule Take 1 capsule by mouth daily.    Marland Kitchen alendronate (FOSAMAX) 70 MG tablet Take 70 mg by mouth every Monday. Take with a full glass of water on an empty stomach.     . Cholecalciferol (VITAMIN D) 2000 units tablet Take 2,000 Units by mouth daily.    . Cyanocobalamin (B-12) 500 MCG TABS Take 500 mcg daily by mouth.     . diclofenac sodium (VOLTAREN) 1 % GEL Apply 1 application topically daily as needed for pain.    . ferrous sulfate 325 (65 FE) MG tablet Take 325 mg by mouth daily as needed (fatigue).     . hydrOXYzine (ATARAX/VISTARIL) 25 MG tablet as needed.    . Multiple Vitamin (MULTIVITAMIN WITH MINERALS) TABS tablet Take 1 tablet by mouth daily.    . Omega-3 Fatty Acids (SALMON OIL-1000 PO) Take 1,000 mg by mouth daily.    Marland Kitchen oxyCODONE (ROXICODONE) 15 MG immediate release tablet Take 15 mg by mouth 2 (two) times daily.     Marland Kitchen zolpidem (AMBIEN) 10 MG tablet Take 10  mg by mouth at bedtime.      No current facility-administered medications for this visit.     Allergies as of 06/21/2019 - Review Complete 06/21/2019  Allergen Reaction Noted  . Ampicillin Other (See Comments)   . Iodine Swelling and Other (See Comments)    Past Medical History:  Diagnosis Date  . Anxiety   . Diverticulosis of colon   . Dysrhythmia    AFib  . History of kidney stones   . Osteoarthritis   . Osteoporosis   . Paroxysmal atrial fibrillation (HCC)    with SVT response  . Presence of permanent cardiac pacemaker   . Sinus node dysfunction (HCC)   . Tachy-brady syndrome [facility])    Past Surgical History:  Procedure Laterality Date  . ABDOMINAL HYSTERECTOMY    . APPENDECTOMY    . BREAST BIOPSY     right, benign  . CERVICAL SPINE SURGERY     two  . CHOLECYSTECTOMY    . COLONOSCOPY  2007   sigmoid diverticula, few ulcerations of terminal ileum, biopsies of TI unremarkable, random colon bx neg for microscopic colitis.  . COLONOSCOPY  06/04/11   Dr. Gala Romney- pegmentation of the rectum, pancolonic diverticula, solitary ulcer in the mouth of the ileocecal valve, distal 15 cm of the terminal ileum appeared normal. Random colon biopsies were benign. Ileocecal  valve ulcer or was benign without any supportive evidence of inflammatory bowel disease.  Marland Kitchen COLONOSCOPY WITH PROPOFOL N/A 04/26/2018   Dr. Gala Romney: diverticulosis  . ESOPHAGOGASTRODUODENOSCOPY  06/04/11   Dr. Vivi Ferns esophagus, deffuse petechial and gastric submucosal petechiae- benign mucousa on bx. Small bowel biopsy benign. No H. pylori.  Randolm Idol / REPLACE / REMOVE PACEMAKER    . left hip surgery     in [place], around 2018  . PERMANENT PACEMAKER INSERTION  07/2011  . s/p hysterectomy    . sb capsule study  2009   Two single small nonbleeding AVMs in the  proximal small bowel, single lymphangiectasia in mid small bowel.  . vocal cord polypectomy     Family History  Problem Relation Age of Onset  . GI problems  Sister        had colostomy (infection)  . Stroke Father 48  . Heart attack Father 2  . COPD Mother 35  . Hypertension Brother   . Diabetes Brother   . Colon cancer Neg Hx   . Inflammatory bowel disease Neg Hx    Social History   Socioeconomic History  . Marital status: Divorced    Spouse name: Not on file  . Number of children: Not on file  . Years of education: Not on file  . Highest education level: Not on file  Occupational History  . Occupation: Publishing copy  Social Needs  . Financial resource strain: Not on file  . Food insecurity    Worry: Not on file    Inability: Not on file  . Transportation needs    Medical: Not on file    Non-medical: Not on file  Tobacco Use  . Smoking status: Former Smoker    Types: Cigarettes    Quit date: 12/15/1973    Years since quitting: 45.5  . Smokeless tobacco: Never Used  . Tobacco comment: occas  Substance and Sexual Activity  . Alcohol use: No  . Drug use: No  . Sexual activity: Not on file  Lifestyle  . Physical activity    Days per week: Not on file    Minutes per session: Not on file  . Stress: Not on file  Relationships  . Social Herbalist on phone: Not on file    Gets together: Not on file    Attends religious service: Not on file    Active member of club or organization: Not on file    Attends meetings of clubs or organizations: Not on file    Relationship status: Not on file  Other Topics Concern  . Not on file  Social History Narrative   Has one son    ROS:  General: Negative for anorexia, weight loss, fever, chills, fatigue, weakness. ENT: Negative for hoarseness, difficulty swallowing , nasal congestion. CV: Negative for chest pain, angina, palpitations, dyspnea on exertion, peripheral edema.  Respiratory: Negative for dyspnea at rest, dyspnea on exertion, cough, sputum, wheezing.  GI: See history of present illness. GU:  Negative for dysuria, hematuria, urinary incontinence, urinary frequency,  nocturnal urination.  Endo: Negative for unusual weight change.    Physical Examination:   BP 113/67   Pulse 74   Temp 97.9 F (36.6 C) (Oral)   Ht 5\' 2"  (1.575 m)   Wt 142 lb 3.2 oz (64.5 kg)   BMI 26.01 kg/m   General: Well-nourished, well-developed in no acute distress.  Eyes: No icterus. Mouth: Oropharyngeal mucosa moist and pink , no lesions erythema  or exudate. Lungs: Clear to auscultation bilaterally.  Heart: Regular rate and rhythm, no murmurs rubs or gallops.  Abdomen: Bowel sounds are normal, nontender, nondistended, no hepatosplenomegaly or masses, no abdominal bruits or hernia , no rebound or guarding.   Extremities: No lower extremity edema. No clubbing or deformities. Neuro: Alert and oriented x 4   Skin: Warm and dry, no jaundice.   Psych: Alert and cooperative, normal mood and affect.

## 2019-06-21 NOTE — Assessment & Plan Note (Signed)
Pleasant 68 year old female presenting with persistent excessive belching/flatulence, postprandial abdominal bloating and epigastric discomfort.  No vomiting but occasional regurgitation.  No typical daily reflux.  No dysphagia.  Chronic daily narcotic therapy.  Denies constipation.  Symptoms have failed to improve with probiotics, low FODMAP diet.  If persistent symptoms, would consider excluding complicated GERD, gastritis, PUD as next step.  Requires deep sedation due to failed conscious sedation previously.  I have discussed the risks, alternatives, benefits with regards to but not limited to the risk of reaction to medication, bleeding, infection, perforation and the patient is agreeable to proceed. Written consent to be obtained.  Start pantoprazole 40 mg daily before breakfast.  Patient had concerns regarding insurance coverage for anesthesiology services.  According to patient, her 2019 colonoscopy anesthesiology services were not covered by [facility] and she was left with a large bill.  I have passed this information on to scheduling, office manager to see if they can determine if our anesthesiology group is in network with her insurance.

## 2019-06-21 NOTE — Patient Instructions (Signed)
1. Upper endoscopy as scheduled. See separate instructions.  2. Trial of pantoprazole once daily before breakfast to see if this helps your bloating/belching/reflux. It will cover gastritis/ulcers and could give [place] a jumpstart on treatment while we wait on upper endoscopy.

## 2019-06-22 ENCOUNTER — Telehealth: Payer: Self-pay

## 2019-06-22 NOTE — Telephone Encounter (Signed)
EGD w/Propofol w/RMR scheduled for 07/28/19 at 2:30 yesterday during OV. Pre-op appt 07/26/19 at 10:00am. Appt letter mailed to pt.

## 2019-06-22 NOTE — Progress Notes (Signed)
cc'ed to pcp °

## 2019-06-23 NOTE — Telephone Encounter (Signed)
Cleo at [facility] advised pt can call [facility] at [phone]. Called and informed pt. Also informed her LSL advised no need to hold [place] prior to EGD.

## 2019-06-23 NOTE — Telephone Encounter (Signed)
Noted. Thanks.

## 2019-06-24 ENCOUNTER — Encounter: Payer: Self-pay | Admitting: Cardiology

## 2019-06-24 NOTE — Progress Notes (Signed)
Remote pacemaker transmission.   

## 2019-06-28 ENCOUNTER — Telehealth: Payer: Self-pay

## 2019-06-28 NOTE — Telephone Encounter (Signed)
PA for EGD submitted via [employer] website. Case needs clinical review. Clinical notes faxed to [facility]. [place] tracking number: [id number].

## 2019-07-04 ENCOUNTER — Telehealth: Payer: Self-pay | Admitting: Internal Medicine

## 2019-07-04 NOTE — Telephone Encounter (Signed)
Called pt, she called [facility] (anesthesia for [facility]) and left message last week and this morning. No return call yet. Advised her to let our office know if she hasn't heard anything by end of the week. (see phone note for 06/21/19)

## 2019-07-04 NOTE — Telephone Encounter (Signed)
Pt said it was very important for MB to call her back.  [phone]

## 2019-07-04 NOTE — Telephone Encounter (Signed)
Received voicemail from [place] at [facility], EGD has been denied d/t pt hasn't been on PPI for at least 2 months. Case can be withdrawn or peer to peer can be done. Case will close in 24 hours. Phone# [phone]. Fax# [phone].

## 2019-07-04 NOTE — Telephone Encounter (Signed)
Received fax from [facility], unable to approve request at the clinical review level because the case does not meet clinical guidelines due to PPI medication therapy has been less than 2 months. OV from 12/2018 uploaded to case via [employer] website.

## 2019-07-04 NOTE — Telephone Encounter (Signed)
Clinical notes also faxed to [employer].

## 2019-07-05 NOTE — Telephone Encounter (Signed)
noted 

## 2019-07-05 NOTE — Telephone Encounter (Signed)
EGD approved. PA# [id number], valid 07/28/19-08/27/19.

## 2019-07-05 NOTE — Telephone Encounter (Signed)
Called [employer] for peer to peer per LSL request. Spoke to [place]. Case hasn't been assigned to a physician for review at this time. Physician will call office for peer to peer discussion if needed. Sometimes physician will review case and approve without doing peer to peer. LSL aware.

## 2019-07-25 ENCOUNTER — Other Ambulatory Visit: Payer: Self-pay

## 2019-07-25 ENCOUNTER — Other Ambulatory Visit (HOSPITAL_COMMUNITY)
Admission: RE | Admit: 2019-07-25 | Discharge: 2019-07-25 | Disposition: A | Discharge status: Home / Self Care | Payer: Medicare HMO | Source: Ambulatory Visit | Attending: Internal Medicine | Admitting: Internal Medicine

## 2019-07-26 ENCOUNTER — Telehealth: Payer: Self-pay | Admitting: Internal Medicine

## 2019-07-26 ENCOUNTER — Other Ambulatory Visit: Payer: Self-pay

## 2019-07-26 ENCOUNTER — Encounter (HOSPITAL_COMMUNITY)
Admission: RE | Admit: 2019-07-26 | Discharge: 2019-07-26 | Disposition: A | Discharge status: Home / Self Care | Payer: Medicare HMO | Source: Ambulatory Visit | Attending: Internal Medicine | Admitting: Internal Medicine

## 2019-07-26 ENCOUNTER — Other Ambulatory Visit (HOSPITAL_COMMUNITY)
Admission: RE | Admit: 2019-07-26 | Discharge: 2019-07-26 | Disposition: A | Discharge status: Home / Self Care | Payer: Medicare HMO | Source: Ambulatory Visit | Attending: Internal Medicine | Admitting: Internal Medicine

## 2019-07-26 DIAGNOSIS — Z20828 Contact with and (suspected) exposure to other viral communicable diseases: Secondary | ICD-10-CM | POA: Diagnosis not present

## 2019-07-26 DIAGNOSIS — K219 Gastro-esophageal reflux disease without esophagitis: Secondary | ICD-10-CM | POA: Diagnosis not present

## 2019-07-26 DIAGNOSIS — K449 Diaphragmatic hernia without obstruction or gangrene: Secondary | ICD-10-CM | POA: Diagnosis not present

## 2019-07-26 DIAGNOSIS — Z01812 Encounter for preprocedural laboratory examination: Secondary | ICD-10-CM | POA: Diagnosis not present

## 2019-07-26 DIAGNOSIS — R1013 Epigastric pain: Secondary | ICD-10-CM | POA: Diagnosis not present

## 2019-07-26 LAB — SARS CORONAVIRUS 2 (TAT 6-24 HRS): SARS Coronavirus 2: NEGATIVE

## 2019-07-26 NOTE — Telephone Encounter (Signed)
Called patient. She stated she spoke with pre-op this morning. She stated they have her scheduled this morning to go for covid-19. NOTHING FURTHER NEEDED

## 2019-07-26 NOTE — Telephone Encounter (Signed)
[name] called to say patient was a no show for her covid test

## 2019-07-28 ENCOUNTER — Ambulatory Visit (HOSPITAL_COMMUNITY): Payer: Medicare HMO | Admitting: Anesthesiology

## 2019-07-28 ENCOUNTER — Encounter (HOSPITAL_COMMUNITY)
Admission: RE | Disposition: A | Discharge status: Home / Self Care | Payer: Self-pay | Source: Home / Self Care | Attending: Internal Medicine

## 2019-07-28 ENCOUNTER — Ambulatory Visit (HOSPITAL_COMMUNITY)
Admission: RE | Admit: 2019-07-28 | Discharge: 2019-07-28 | Disposition: A | Discharge status: Home / Self Care | Payer: Medicare HMO | Attending: Internal Medicine | Admitting: Internal Medicine

## 2019-07-28 ENCOUNTER — Other Ambulatory Visit: Payer: Self-pay

## 2019-07-28 ENCOUNTER — Encounter (HOSPITAL_COMMUNITY): Payer: Self-pay

## 2019-07-28 DIAGNOSIS — I480 Paroxysmal atrial fibrillation: Secondary | ICD-10-CM | POA: Diagnosis not present

## 2019-07-28 DIAGNOSIS — Z950 Presence of cardiac pacemaker: Secondary | ICD-10-CM | POA: Diagnosis not present

## 2019-07-28 DIAGNOSIS — I495 Sick sinus syndrome: Secondary | ICD-10-CM | POA: Diagnosis not present

## 2019-07-28 DIAGNOSIS — Z79899 Other long term (current) drug therapy: Secondary | ICD-10-CM | POA: Diagnosis not present

## 2019-07-28 DIAGNOSIS — K5730 Diverticulosis of large intestine without perforation or abscess without bleeding: Secondary | ICD-10-CM | POA: Insufficient documentation

## 2019-07-28 DIAGNOSIS — K219 Gastro-esophageal reflux disease without esophagitis: Secondary | ICD-10-CM | POA: Diagnosis not present

## 2019-07-28 DIAGNOSIS — R1013 Epigastric pain: Secondary | ICD-10-CM

## 2019-07-28 DIAGNOSIS — Z20828 Contact with and (suspected) exposure to other viral communicable diseases: Secondary | ICD-10-CM | POA: Diagnosis not present

## 2019-07-28 DIAGNOSIS — M1990 Unspecified osteoarthritis, unspecified site: Secondary | ICD-10-CM | POA: Diagnosis not present

## 2019-07-28 DIAGNOSIS — F419 Anxiety disorder, unspecified: Secondary | ICD-10-CM | POA: Insufficient documentation

## 2019-07-28 DIAGNOSIS — Z87891 Personal history of nicotine dependence: Secondary | ICD-10-CM | POA: Diagnosis not present

## 2019-07-28 DIAGNOSIS — K449 Diaphragmatic hernia without obstruction or gangrene: Secondary | ICD-10-CM | POA: Insufficient documentation

## 2019-07-28 DIAGNOSIS — M810 Age-related osteoporosis without current pathological fracture: Secondary | ICD-10-CM | POA: Insufficient documentation

## 2019-07-28 HISTORY — PX: ESOPHAGOGASTRODUODENOSCOPY (EGD) WITH PROPOFOL: SHX5813

## 2019-07-28 SURGERY — ESOPHAGOGASTRODUODENOSCOPY (EGD) WITH PROPOFOL
Anesthesia: General

## 2019-07-28 MED ORDER — MIDAZOLAM HCL 2 MG/2ML IJ SOLN: 0.5000 mg | Freq: Once | INTRAMUSCULAR | Status: DC | PRN

## 2019-07-28 MED ORDER — CHLORHEXIDINE GLUCONATE CLOTH 2 % EX PADS: 6.0000 | MEDICATED_PAD | Freq: Once | CUTANEOUS | Status: DC

## 2019-07-28 MED ORDER — HYDROMORPHONE HCL 1 MG/ML IJ SOLN: 0.2500 mg | INTRAMUSCULAR | Status: DC | PRN

## 2019-07-28 MED ORDER — KETAMINE HCL 50 MG/5ML IJ SOSY
PREFILLED_SYRINGE | INTRAMUSCULAR | Status: AC
  Filled 2019-07-28: qty 5

## 2019-07-28 MED ORDER — PROPOFOL 500 MG/50ML IV EMUL
INTRAVENOUS | Status: DC | PRN
  Administered 2019-07-28: 150 ug/kg/min via INTRAVENOUS

## 2019-07-28 MED ORDER — PROPOFOL 10 MG/ML IV BOLUS
INTRAVENOUS | Status: DC | PRN
  Administered 2019-07-28: 20 mg via INTRAVENOUS

## 2019-07-28 MED ORDER — KETAMINE HCL 10 MG/ML IJ SOLN
INTRAMUSCULAR | Status: DC | PRN
  Administered 2019-07-28: 10 mg via INTRAVENOUS

## 2019-07-28 MED ORDER — GLYCOPYRROLATE 0.2 MG/ML IJ SOLN
INTRAMUSCULAR | Status: DC | PRN
  Administered 2019-07-28: 0.2 mg via INTRAVENOUS

## 2019-07-28 MED ORDER — LIDOCAINE HCL (CARDIAC) PF 100 MG/5ML IV SOSY
PREFILLED_SYRINGE | INTRAVENOUS | Status: DC | PRN
  Administered 2019-07-28: 40 mg via INTRAVENOUS

## 2019-07-28 MED ORDER — HYDROCODONE-ACETAMINOPHEN 7.5-325 MG PO TABS: 1.0000 | ORAL_TABLET | Freq: Once | ORAL | Status: DC | PRN

## 2019-07-28 MED ORDER — LACTATED RINGERS IV SOLN
INTRAVENOUS | Status: DC
  Administered 2019-07-28: 13:00:00 via INTRAVENOUS

## 2019-07-28 MED ORDER — PROMETHAZINE HCL 25 MG/ML IJ SOLN: 6.2500 mg | INTRAMUSCULAR | Status: DC | PRN

## 2019-07-28 NOTE — Anesthesia Postprocedure Evaluation (Signed)
Anesthesia Post Note  Patient: [name]  Procedure(s) Performed: ESOPHAGOGASTRODUODENOSCOPY (EGD) WITH PROPOFOL (N/A )  Patient location during evaluation: PACU Anesthesia Type: General Level of consciousness: awake and alert and oriented Pain management: pain level controlled Vital Signs Assessment: post-procedure vital signs reviewed and stable Respiratory status: spontaneous breathing Cardiovascular status: stable Postop Assessment: no apparent nausea or vomiting Anesthetic complications: no     Last Vitals:  Vitals:   07/28/19 1302 07/28/19 1440  BP: 122/76 111/60  Pulse: 60 71  Resp: 18 19  Temp: 36.8 C 36.9 C  SpO2: 95% 98%    Last Pain:  Vitals:   07/28/19 1440  TempSrc:   PainSc: 0-No pain                 Kaheem Halleck A

## 2019-07-28 NOTE — Anesthesia Preprocedure Evaluation (Signed)
Anesthesia Evaluation  Patient identified by MRN, date of birth, ID band Patient awake    Reviewed: Allergy & Precautions, NPO status , Patient's Chart, lab work & pertinent test results  Airway Mallampati: II  TM Distance: >3 FB Neck ROM: Full    Dental no notable dental hx. (+) Teeth Intact   Pulmonary neg pulmonary ROS, former smoker,    Pulmonary exam normal breath sounds clear to auscultation       Cardiovascular Exercise Tolerance: Good Normal cardiovascular exam+ dysrhythmias Atrial Fibrillation and Supra Ventricular Tachycardia + pacemaker I Rhythm:Regular Rate:Normal  Denies recent CP/MI States walks about a block , but limited due to Hip issues for which she is on chronic narcotics    Neuro/Psych Anxiety negative neurological ROS  negative psych ROS   GI/Hepatic Neg liver ROS, GERD  Medicated and Controlled,  Endo/Other  negative endocrine ROS  Renal/GU negative Renal ROS  negative genitourinary   Musculoskeletal  (+) Arthritis , Osteoarthritis,    Abdominal   Peds negative pediatric ROS (+)  Hematology negative hematology ROS (+)   Anesthesia Other Findings   Reproductive/Obstetrics negative OB ROS                             Anesthesia Physical Anesthesia Plan  ASA: III  Anesthesia Plan: General   Post-op Pain Management:    Induction: Intravenous  PONV Risk Score and Plan:   Airway Management Planned: Nasal Cannula and Simple Face Mask  Additional Equipment:   Intra-op Plan:   Post-operative Plan:   Informed Consent: I have reviewed the patients History and Physical, chart, labs and discussed the procedure including the risks, benefits and alternatives for the proposed anesthesia with the patient or authorized representative who has indicated his/her understanding and acceptance.     Dental advisory given  Plan Discussed with: CRNA  Anesthesia Plan  Comments: (Plan Full PPE use  Plan GA with GETA as needed d/w pt -WTP with same after Q&A )        Anesthesia Quick Evaluation

## 2019-07-28 NOTE — Transfer of Care (Signed)
Immediate Anesthesia Transfer of Care Note  Patient: [name]  Procedure(s) Performed: ESOPHAGOGASTRODUODENOSCOPY (EGD) WITH PROPOFOL (N/A )  Patient Location: PACU  Anesthesia Type:General  Level of Consciousness: awake, alert , oriented and patient cooperative  Airway & Oxygen Therapy: Patient Spontanous Breathing  Post-op Assessment: Report given to RN and Post -op Vital signs reviewed and stable  Post vital signs: Reviewed and stable  Last Vitals:  Vitals Value Taken Time  BP    Temp    Pulse 71 07/28/19 1441  Resp 18 07/28/19 1441  SpO2 98 % 07/28/19 1441  Vitals shown include unvalidated device data.  Last Pain:  Vitals:   07/28/19 1302  TempSrc: Oral  PainSc:          Complications: No apparent anesthesia complications

## 2019-07-28 NOTE — H&P (Signed)
@LOGO @   Primary Care Physician:  Redmond School, MD Primary Gastroenterologist:  Dr. Gala Romney  Pre-Procedure History & Physical: HPI:  [name] is a 68 y.o. female here for further evaluation of dyspepsia and GERD.  No dysphagia.  Protonix 40 mg daily has helped but not completely ameliorated her symptoms.  She thinks Fosamax makes it worse and is stopped taking it on her own.  Here for an EGD to further evaluate.  Past Medical History:  Diagnosis Date  . Anxiety   . Diverticulosis of colon   . Dysrhythmia    AFib  . History of kidney stones   . Osteoarthritis   . Osteoporosis   . Paroxysmal atrial fibrillation (HCC)    with SVT response  . Presence of permanent cardiac pacemaker   . Sinus node dysfunction (HCC)   . Tachy-brady syndrome [facility])     Past Surgical History:  Procedure Laterality Date  . ABDOMINAL HYSTERECTOMY    . APPENDECTOMY    . BREAST BIOPSY     right, benign  . CERVICAL SPINE SURGERY     two  . CHOLECYSTECTOMY    . COLONOSCOPY  2007   sigmoid diverticula, few ulcerations of terminal ileum, biopsies of TI unremarkable, random colon bx neg for microscopic colitis.  . COLONOSCOPY  06/04/11   Dr. Gala Romney- pegmentation of the rectum, pancolonic diverticula, solitary ulcer in the mouth of the ileocecal valve, distal 15 cm of the terminal ileum appeared normal. Random colon biopsies were benign. Ileocecal valve ulcer or was benign without any supportive evidence of inflammatory bowel disease.  Marland Kitchen COLONOSCOPY WITH PROPOFOL N/A 04/26/2018   Dr. Gala Romney: diverticulosis  . ESOPHAGOGASTRODUODENOSCOPY  06/04/11   Dr. Vivi Ferns esophagus, deffuse petechial and gastric submucosal petechiae- benign mucousa on bx. Small bowel biopsy benign. No H. pylori.  Randolm Idol / REPLACE / REMOVE PACEMAKER    . left hip surgery     in [place], around 2018  . PERMANENT PACEMAKER INSERTION  07/2011  . s/p hysterectomy    . sb capsule study  2009   Two single small nonbleeding AVMs in  the  proximal small bowel, single lymphangiectasia in mid small bowel.  . vocal cord polypectomy      Prior to Admission medications   Medication Sig Start Date End Date Taking? Authorizing Provider  acidophilus (RISAQUAD) CAPS capsule Take 1 capsule by mouth daily.   Yes [provider]  b complex vitamins tablet Take 1 tablet by mouth daily.   Yes [provider]  CALCIUM-VITAMIN D PO Take 1 tablet by mouth 2 (two) times daily.   Yes [provider]  Cholecalciferol (VITAMIN D) 2000 units tablet Take 2,000 Units by mouth daily.   Yes [provider]  diclofenac sodium (VOLTAREN) 1 % GEL Apply 1 application topically daily as needed (pain).  11/16/18  Yes [provider]  ferrous sulfate 325 (65 FE) MG tablet Take 325 mg by mouth daily as needed (fatigue).    Yes [provider]  hydrOXYzine (ATARAX/VISTARIL) 25 MG tablet Take 25 mg by mouth every 8 (eight) hours as needed for anxiety.  03/01/19  Yes [provider]  Multiple Vitamin (MULTIVITAMIN WITH MINERALS) TABS tablet Take 1 tablet by mouth daily.   Yes [provider]  oxyCODONE (ROXICODONE) 15 MG immediate release tablet Take 15 mg by mouth every 6 (six) hours as needed (for pain).  07/12/15  Yes [provider]  pantoprazole (PROTONIX) 40 MG tablet Take 1 tablet (  40 mg total) by mouth daily before breakfast. 06/21/19  Yes Mahala Menghini, PA-C  vitamin C (ASCORBIC ACID) 500 MG tablet Take 500 mg by mouth daily.   Yes [provider]  zolpidem (AMBIEN) 10 MG tablet Take 10 mg by mouth at bedtime.  04/29/11  Yes [provider]    Allergies as of 06/21/2019 - Review Complete 06/21/2019  Allergen Reaction Noted  . Ampicillin Other (See Comments)   . Iodine Swelling and Other (See Comments)     Family History  Problem Relation Age of Onset  . GI problems Sister        had colostomy (infection)  . Stroke Father 75  . Heart attack Father 23   . COPD Mother 71  . Hypertension Brother   . Diabetes Brother   . Colon cancer Neg Hx   . Inflammatory bowel disease Neg Hx     Social History   Socioeconomic History  . Marital status: Divorced    Spouse name: Not on file  . Number of children: Not on file  . Years of education: Not on file  . Highest education level: Not on file  Occupational History  . Occupation: Publishing copy  Social Needs  . Financial resource strain: Not on file  . Food insecurity    Worry: Not on file    Inability: Not on file  . Transportation needs    Medical: Not on file    Non-medical: Not on file  Tobacco Use  . Smoking status: Former Smoker    Types: Cigarettes    Quit date: 12/15/1973    Years since quitting: 45.6  . Smokeless tobacco: Never Used  . Tobacco comment: occas  Substance and Sexual Activity  . Alcohol use: No  . Drug use: No  . Sexual activity: Not on file  Lifestyle  . Physical activity    Days per week: Not on file    Minutes per session: Not on file  . Stress: Not on file  Relationships  . Social Herbalist on phone: Not on file    Gets together: Not on file    Attends religious service: Not on file    Active member of club or organization: Not on file    Attends meetings of clubs or organizations: Not on file    Relationship status: Not on file  . Intimate partner violence    Fear of current or ex partner: Not on file    Emotionally abused: Not on file    Physically abused: Not on file    Forced sexual activity: Not on file  Other Topics Concern  . Not on file  Social History Narrative   Has one son    Review of Systems: See HPI, otherwise negative ROS  Physical Exam: BP 122/76   Pulse 60   Temp 98.3 F (36.8 C) (Oral)   Resp 18   Ht 5\' 2"  (1.575 m)   Wt 64.4 kg   SpO2 95%   BMI 25.97 kg/m  General:   Alert,  Well-developed, well-nourished, pleasant and cooperative in NAD Neck:  Supple; no masses or thyromegaly. No significant cervical  adenopathy. Lungs:  Clear throughout to auscultation.   No wheezes, crackles, or rhonchi. No acute distress. Heart:  Regular rate and rhythm; no murmurs, clicks, rubs,  or gallops. Abdomen: Non-distended, normal bowel sounds.  Soft and nontender without appreciable mass or hepatosplenomegaly.  Pulses:  Normal pulses noted. Extremities:  Without clubbing or  edema.  Impression: 68 year old with dyspepsia reflux symptoms improved but still persistent on a daily Protonix.  No dysphagia.  Fosamax is perceived to make things worse and she is stopped taking it. Offer the patient an EGD today to further evaluate per plan.  The risks, benefits, limitations, alternatives and imponderables have been reviewed with the patient. Potential for esophageal dilation, biopsy, etc. have also been reviewed.  Questions have been answered. All parties agreeable.     Notice: This dictation was prepared with Dragon dictation along with smaller phrase technology. Any transcriptional errors that result from this process are unintentional and may not be corrected upon review.

## 2019-07-28 NOTE — Op Note (Signed)
[facility] Patient Name: [name] Procedure Date: 07/28/2019 1:56 PM MRN: [id number] Date of Birth: [birth year] Attending MD: Norvel Richards , MD CSN: [id number] Age: 68 Admit Type: Outpatient Procedure:                Upper GI endoscopy Indications:              Dyspepsia Providers:                Norvel Richards, MD, Jeanann Lewandowsky. Sharon Seller, RN,                            Raphael Gibney Tech., Technician, Randa Spike,                            Technician Referring MD:              Medicines:                Propofol per Anesthesia Complications:            No immediate complications. Estimated Blood Loss:     Estimated blood loss: none. Estimated blood loss:                            none. Procedure:                Pre-Anesthesia Assessment:                           - Prior to the procedure, a History and Physical                            was performed, and patient medications and                            allergies were reviewed. The patient's tolerance of                            previous anesthesia was also reviewed. The risks                            and benefits of the procedure and the sedation                            options and risks were discussed with the patient.                            All questions were answered, and informed consent                            was obtained. Prior Anticoagulants: The patient has                            taken no previous anticoagulant or antiplatelet                            agents. ASA Grade Assessment: II -  A patient with                            mild systemic disease. After reviewing the risks                            and benefits, the patient was deemed in                            satisfactory condition to undergo the procedure.                           After obtaining informed consent, the endoscope was                            passed under direct vision. Throughout the              procedure, the patient's blood pressure, pulse, and                            oxygen saturations were monitored continuously. The                            GIF-H190 ([id number]) scope was introduced through the                            mouth, and advanced to the second part of duodenum.                            The upper GI endoscopy was accomplished without                            difficulty. The patient tolerated the procedure                            well. Scope In: 2:32:34 PM Scope Out: 2:35:46 PM Total Procedure Duration: 0 hours 3 minutes 12 seconds  Findings:      The examined esophagus was normal.      A small hiatal hernia was present.      The exam was otherwise without abnormality.      The duodenal bulb and second portion of the duodenum were normal. Impression:               - Normal esophagus.                           - Small hiatal hernia.                           - The examination was otherwise normal.                           - No specimens collected. I suspect Fosamax had                            much to do with  her recent symptoms. Moderate Sedation:      Moderate (conscious) sedation was personally administered by an       anesthesia professional. The following parameters were monitored: oxygen       saturation, heart rate, blood pressure, respiratory rate, EKG, adequacy       of pulmonary ventilation, and response to care. Recommendation:           - Patient has a contact number available for                            emergencies. The signs and symptoms of potential                            delayed complications were discussed with the                            patient. Return to normal activities tomorrow.                            Written discharge instructions were provided to the                            patient.                           - Resume previous diet.                           - Continue present medications. Patient  has                            determined that Fosamax had much to do with her                            symptoms and she has stopped taking it without                            notifying [name]. I do agree that she should                            stay off of oral bisphosphonate therapy. Follow-up                            with [name] for alternatives i.e. parenteral                            bisphosphonate therapy. We will give her a 2-week                            course of Carafate 1 g slurry before meals and                            nightly x5 days. She is to continue her Protonix 40  mg daily. Office visit with [place] in 6 to 8 weeks. Procedure Code(s):        --- Professional ---                           [phone], Esophagogastroduodenoscopy, flexible,                            transoral; diagnostic, including collection of                            specimen(s) by brushing or washing, when performed                            (separate procedure) Diagnosis Code(s):        --- Professional ---                           K44.9, Diaphragmatic hernia without obstruction or                            gangrene                           R10.13, Epigastric pain CPT copyright 2019 American Medical Association. All rights reserved. The codes documented in this report are preliminary and upon coder review may  be revised to meet current compliance requirements. [name]. Brandy Foskett, MD Norvel Richards, MD 07/28/2019 2:45:40 PM This report has been signed electronically. Number of Addenda: 0

## 2019-07-28 NOTE — Anesthesia Procedure Notes (Signed)
Procedure Name: General with mask airway Performed by: Andree Elk Mariacristina Aday A, CRNA Pre-anesthesia Checklist: Patient identified, Emergency Drugs available, Suction available, Patient being monitored and Timeout performed

## 2019-07-28 NOTE — Discharge Instructions (Signed)
EGD Discharge instructions Please read the instructions outlined below and refer to this sheet in the next few weeks. These discharge instructions provide you with general information on caring for yourself after you leave the hospital. Your doctor may also give you specific instructions. While your treatment has been planned according to the most current medical practices available, unavoidable complications occasionally occur. If you have any problems or questions after discharge, please call your doctor. ACTIVITY  You may resume your regular activity but move at a slower pace for the next 24 hours.   Take frequent rest periods for the next 24 hours.   Walking will help expel (get rid of) the air and reduce the bloated feeling in your abdomen.   No driving for 24 hours (because of the anesthesia (medicine) used during the test).   You may shower.   Do not sign any important legal documents or operate any machinery for 24 hours (because of the anesthesia used during the test).  NUTRITION  Drink plenty of fluids.   You may resume your normal diet.   Begin with a light meal and progress to your normal diet.   Avoid alcoholic beverages for 24 hours or as instructed by your caregiver.  MEDICATIONS  You may resume your normal medications unless your caregiver tells you otherwise.  WHAT YOU CAN EXPECT TODAY  You may experience abdominal discomfort such as a feeling of fullness or gas pains.  FOLLOW-UP  Your doctor will discuss the results of your test with you.  SEEK IMMEDIATE MEDICAL ATTENTION IF ANY OF THE FOLLOWING OCCUR:  Excessive nausea (feeling sick to your stomach) and/or vomiting.   Severe abdominal pain and distention (swelling).   Trouble swallowing.   Temperature over 101 F (37.8 C).   Rectal bleeding or vomiting of blood.    GERD information provided  Continue Protonix 40 mg daily  No more Fosamax; check with Dr. Gerarda Fraction ever regarding an alternative  medication for osteoporosis  Carafate 1 g slurry before meals and at bedtime for 2 weeks  Office visit with [place] in 2 months  At patient's request I spoke to her Senaida Lange at [phone]      Gastroesophageal Reflux Disease, Adult Gastroesophageal reflux (GER) happens when acid from the stomach flows up into the tube that connects the mouth and the stomach (esophagus). Normally, food travels down the esophagus and stays in the stomach to be digested. With GER, food and stomach acid sometimes move back up into the esophagus. You may have a disease called gastroesophageal reflux disease (GERD) if the reflux:  Happens often.  Causes frequent or very bad symptoms.  Causes problems such as damage to the esophagus. When this happens, the esophagus becomes sore and swollen (inflamed). Over time, GERD can make small holes (ulcers) in the lining of the esophagus. What are the causes? This condition is caused by a problem with the muscle between the esophagus and the stomach. When this muscle is weak or not normal, it does not close properly to keep food and acid from coming back up from the stomach. The muscle can be weak because of:  Tobacco use.  Pregnancy.  Having a certain type of hernia (hiatal hernia).  Alcohol use.  Certain foods and drinks, such as coffee, chocolate, onions, and peppermint. What increases the risk? You are more likely to develop this condition if you:  Are overweight.  Have a disease that affects your connective tissue.  Use NSAID medicines. What are the  signs or symptoms? Symptoms of this condition include:  Heartburn.  Difficult or painful swallowing.  The feeling of having a lump in the throat.  A bitter taste in the mouth.  Bad breath.  Having a lot of saliva.  Having an upset or bloated stomach.  Belching.  Chest pain. Different conditions can cause chest pain. Make sure you see your doctor if you have chest  pain.  Shortness of breath or noisy breathing (wheezing).  Ongoing (chronic) cough or a cough at night.  Wearing away of the surface of teeth (tooth enamel).  Weight loss. How is this treated? Treatment will depend on how bad your symptoms are. Your doctor may suggest:  Changes to your diet.  Medicine.  Surgery. Follow these instructions at home: Eating and drinking   Follow a diet as told by your doctor. You may need to avoid foods and drinks such as: ? Coffee and tea (with or without caffeine). ? Drinks that contain alcohol. ? Energy drinks and sports drinks. ? Bubbly (carbonated) drinks or sodas. ? Chocolate and cocoa. ? Peppermint and mint flavorings. ? Garlic and onions. ? Horseradish. ? Spicy and acidic foods. These include peppers, chili powder, curry powder, vinegar, hot sauces, and BBQ sauce. ? Citrus fruit juices and citrus fruits, such as oranges, lemons, and limes. ? Tomato-based foods. These include red sauce, chili, salsa, and pizza with red sauce. ? Fried and fatty foods. These include donuts, french fries, potato chips, and high-fat dressings. ? High-fat meats. These include hot dogs, rib eye steak, sausage, ham, and bacon. ? High-fat dairy items, such as whole milk, butter, and cream cheese.  Eat small meals often. Avoid eating large meals.  Avoid drinking large amounts of liquid with your meals.  Avoid eating meals during the 2-3 hours before bedtime.  Avoid lying down right after you eat.  Do not exercise right after you eat. Lifestyle   Do not use any products that contain nicotine or tobacco. These include cigarettes, e-cigarettes, and chewing tobacco. If you need help quitting, ask your doctor.  Try to lower your stress. If you need help doing this, ask your doctor.  If you are overweight, lose an amount of weight that is healthy for you. Ask your doctor about a safe weight loss goal. General instructions  Pay attention to any changes in  your symptoms.  Take over-the-counter and prescription medicines only as told by your doctor. Do not take aspirin, ibuprofen, or other NSAIDs unless your doctor says it is okay.  Wear loose clothes. Do not wear anything tight around your waist.  Raise (elevate) the head of your bed about 6 inches (15 cm).  Avoid bending over if this makes your symptoms worse.  Keep all follow-up visits as told by your doctor. This is important. Contact a doctor if:  You have new symptoms.  You lose weight and you do not know why.  You have trouble swallowing or it hurts to swallow.  You have wheezing or a cough that keeps happening.  Your symptoms do not get better with treatment.  You have a hoarse voice. Get help right away if:  You have pain in your arms, neck, jaw, teeth, or back.  You feel sweaty, dizzy, or light-headed.  You have chest pain or shortness of breath.  You throw up (vomit) and your throw-up looks like blood or coffee grounds.  You pass out (faint).  Your poop (stool) is bloody or black.  You cannot swallow, drink, or  eat. Summary  If a person has gastroesophageal reflux disease (GERD), food and stomach acid move back up into the esophagus and cause symptoms or problems such as damage to the esophagus.  Treatment will depend on how bad your symptoms are.  Follow a diet as told by your doctor.  Take all medicines only as told by your doctor. This information is not intended to replace advice given to you by your health care provider. Make sure you discuss any questions you have with your health care provider. Document Released: 05/19/2008 Document Revised: 06/09/2018 Document Reviewed: 06/09/2018 Elsevier Patient Education  2020 [place] After These instructions provide you with information about caring for yourself after your procedure. Your health care provider may also give you more specific instructions. Your  treatment has been planned according to current medical practices, but problems sometimes occur. Call your health care provider if you have any problems or questions after your procedure. What can I expect after the procedure? After your procedure, you may:  Feel sleepy for several hours.  Feel clumsy and have poor balance for several hours.  Feel forgetful about what happened after the procedure.  Have poor judgment for several hours.  Feel nauseous or vomit.  Have a sore throat if you had a breathing tube during the procedure. Follow these instructions at home: For at least 24 hours after the procedure:      Have a responsible adult stay with you. It is important to have someone help care for you until you are awake and alert.  Rest as needed.  Do not: ? Participate in activities in which you could fall or become injured. ? Drive. ? Use heavy machinery. ? Drink alcohol. ? Take sleeping pills or medicines that cause drowsiness. ? Make important decisions or sign legal documents. ? Take care of children on your own. Eating and drinking  Follow the diet that is recommended by your health care provider.  If you vomit, drink water, juice, or soup when you can drink without vomiting.  Make sure you have little or no nausea before eating solid foods. General instructions  Take over-the-counter and prescription medicines only as told by your health care provider.  If you have sleep apnea, surgery and certain medicines can increase your risk for breathing problems. Follow instructions from your health care provider about wearing your sleep device: ? Anytime you are sleeping, including during daytime naps. ? While taking prescription pain medicines, sleeping medicines, or medicines that make you drowsy.  If you smoke, do not smoke without supervision.  Keep all follow-up visits as told by your health care provider. This is important. Contact a health care provider  if:  You keep feeling nauseous or you keep vomiting.  You feel light-headed.  You develop a rash.  You have a fever. Get help right away if:  You have trouble breathing. Summary  For several hours after your procedure, you may feel sleepy and have poor judgment.  Have a responsible adult stay with you for at least 24 hours or until you are awake and alert. This information is not intended to replace advice given to you by your health care provider. Make sure you discuss any questions you have with your health care provider. Document Released: 03/23/2016 Document Revised: 03/01/2018 Document Reviewed: 03/23/2016 Elsevier Patient Education  2020 [employer].

## 2019-08-01 DIAGNOSIS — R7309 Other abnormal glucose: Secondary | ICD-10-CM | POA: Diagnosis not present

## 2019-08-01 DIAGNOSIS — E663 Overweight: Secondary | ICD-10-CM | POA: Diagnosis not present

## 2019-08-01 DIAGNOSIS — M810 Age-related osteoporosis without current pathological fracture: Secondary | ICD-10-CM | POA: Diagnosis not present

## 2019-08-01 DIAGNOSIS — G894 Chronic pain syndrome: Secondary | ICD-10-CM | POA: Diagnosis not present

## 2019-08-01 DIAGNOSIS — Z6825 Body mass index (BMI) 25.0-25.9, adult: Secondary | ICD-10-CM | POA: Diagnosis not present

## 2019-08-05 ENCOUNTER — Encounter (HOSPITAL_COMMUNITY): Payer: Self-pay | Admitting: Internal Medicine

## 2019-08-25 DIAGNOSIS — D1801 Hemangioma of skin and subcutaneous tissue: Secondary | ICD-10-CM | POA: Diagnosis not present

## 2019-08-25 DIAGNOSIS — L819 Disorder of pigmentation, unspecified: Secondary | ICD-10-CM | POA: Diagnosis not present

## 2019-08-25 DIAGNOSIS — L570 Actinic keratosis: Secondary | ICD-10-CM | POA: Diagnosis not present

## 2019-08-25 DIAGNOSIS — L821 Other seborrheic keratosis: Secondary | ICD-10-CM | POA: Diagnosis not present

## 2019-08-30 ENCOUNTER — Other Ambulatory Visit (HOSPITAL_COMMUNITY): Payer: Self-pay | Admitting: Physician Assistant

## 2019-08-30 DIAGNOSIS — E2839 Other primary ovarian failure: Secondary | ICD-10-CM

## 2019-09-01 DIAGNOSIS — G894 Chronic pain syndrome: Secondary | ICD-10-CM | POA: Diagnosis not present

## 2019-09-01 DIAGNOSIS — E663 Overweight: Secondary | ICD-10-CM | POA: Diagnosis not present

## 2019-09-01 DIAGNOSIS — M810 Age-related osteoporosis without current pathological fracture: Secondary | ICD-10-CM | POA: Diagnosis not present

## 2019-09-01 DIAGNOSIS — Z6826 Body mass index (BMI) 26.0-26.9, adult: Secondary | ICD-10-CM | POA: Diagnosis not present

## 2019-09-02 DIAGNOSIS — R7309 Other abnormal glucose: Secondary | ICD-10-CM | POA: Diagnosis not present

## 2019-09-12 ENCOUNTER — Ambulatory Visit (INDEPENDENT_AMBULATORY_CARE_PROVIDER_SITE_OTHER): Payer: Medicare HMO | Admitting: *Deleted

## 2019-09-12 DIAGNOSIS — I495 Sick sinus syndrome: Secondary | ICD-10-CM

## 2019-09-12 DIAGNOSIS — I471 Supraventricular tachycardia: Secondary | ICD-10-CM

## 2019-09-12 LAB — CUP PACEART REMOTE DEVICE CHECK
Battery Remaining Longevity: 38 mo
Battery Remaining Percentage: 29 %
Battery Voltage: 2.81 V
Brady Statistic AP VP Percent: 1 %
Brady Statistic AP VS Percent: 43 %
Brady Statistic AS VP Percent: 1 %
Brady Statistic AS VS Percent: 57 %
Brady Statistic RA Percent Paced: 43 %
Brady Statistic RV Percent Paced: 1 %
Date Time Interrogation Session: 20200928070450
Implantable Lead Implant Date: 20120802
Implantable Lead Implant Date: 20120802
Implantable Lead Location: 753859
Implantable Lead Location: 753860
Implantable Pulse Generator Implant Date: 20120802
Lead Channel Impedance Value: 450 Ohm
Lead Channel Impedance Value: 460 Ohm
Lead Channel Pacing Threshold Amplitude: 0.5 V
Lead Channel Pacing Threshold Amplitude: 1.125 V
Lead Channel Pacing Threshold Pulse Width: 0.5 ms
Lead Channel Pacing Threshold Pulse Width: 0.5 ms
Lead Channel Sensing Intrinsic Amplitude: 2 mV
Lead Channel Sensing Intrinsic Amplitude: 3.7 mV
Lead Channel Setting Pacing Amplitude: 1.375
Lead Channel Setting Pacing Amplitude: 1.5 V
Lead Channel Setting Pacing Pulse Width: 0.5 ms
Lead Channel Setting Sensing Sensitivity: 1 mV
Pulse Gen Model: 2210
Pulse Gen Serial Number: 7250678

## 2019-09-21 DIAGNOSIS — E782 Mixed hyperlipidemia: Secondary | ICD-10-CM | POA: Diagnosis not present

## 2019-09-21 DIAGNOSIS — F329 Major depressive disorder, single episode, unspecified: Secondary | ICD-10-CM | POA: Diagnosis not present

## 2019-09-21 NOTE — Progress Notes (Signed)
Remote pacemaker transmission.   

## 2019-09-23 ENCOUNTER — Ambulatory Visit (HOSPITAL_COMMUNITY)
Admission: RE | Admit: 2019-09-23 | Discharge: 2019-09-23 | Disposition: A | Discharge status: Home / Self Care | Payer: Medicare HMO | Source: Ambulatory Visit | Attending: Physician Assistant | Admitting: Physician Assistant

## 2019-09-23 ENCOUNTER — Other Ambulatory Visit: Payer: Self-pay

## 2019-09-23 DIAGNOSIS — E2839 Other primary ovarian failure: Secondary | ICD-10-CM | POA: Insufficient documentation

## 2019-09-23 DIAGNOSIS — M810 Age-related osteoporosis without current pathological fracture: Secondary | ICD-10-CM | POA: Diagnosis not present

## 2019-09-27 ENCOUNTER — Other Ambulatory Visit: Payer: Self-pay | Admitting: Internal Medicine

## 2019-09-28 ENCOUNTER — Ambulatory Visit (INDEPENDENT_AMBULATORY_CARE_PROVIDER_SITE_OTHER): Payer: Medicare HMO | Admitting: Gastroenterology

## 2019-09-28 ENCOUNTER — Encounter: Payer: Self-pay | Admitting: Gastroenterology

## 2019-09-28 ENCOUNTER — Other Ambulatory Visit: Payer: Self-pay

## 2019-09-28 VITALS — BP 119/59 | HR 62 | Temp 97.2°F | Ht 62.0 in | Wt 144.0 lb

## 2019-09-28 DIAGNOSIS — K219 Gastro-esophageal reflux disease without esophagitis: Secondary | ICD-10-CM

## 2019-09-28 DIAGNOSIS — R1013 Epigastric pain: Secondary | ICD-10-CM | POA: Diagnosis not present

## 2019-09-28 MED ORDER — PANTOPRAZOLE SODIUM 40 MG PO TBEC: 40.0000 mg | DELAYED_RELEASE_TABLET | Freq: Every day | ORAL | 11 refills | Status: DC

## 2019-09-28 NOTE — Patient Instructions (Signed)
1. Continue pantoprazole 40mg  daily before breakfast. RX sent to pharmacy.  2. You can complete Carafate or use just as needed for abdominal pain, heartburn, etc. 3. Return to the office as needed.

## 2019-09-28 NOTE — Assessment & Plan Note (Signed)
Doing very well.  She noted big improvement in her reflux symptoms, abdominal bloating, belching/flatulence once she came off of Fosamax and was on pantoprazole along with Carafate.  At this time would encourage her to complete course of Carafate, resume pantoprazole 40 mg daily before breakfast.  She is trying to get approved for injectable process medication as she did not tolerate oral bisphosphonates.  Return to the office as needed.

## 2019-09-28 NOTE — Progress Notes (Signed)
Primary Care Physician: Redmond School, MD  Primary Gastroenterologist:  Garfield Cornea, MD   Chief Complaint  Patient presents with  . belching    has improved.     HPI: [name] is a 68 y.o. female here for follow-up.  She was seen back in July with persisting upper GI symptoms including bloating, gas, reflux/regurgitation.  EGD was performed, showed small hiatal hernia but otherwise unremarkable.  It was felt that Fosamax likely was contributing to her dyspepsia.  She was advised to continue pantoprazole 40 mg daily (was started in July), short course of Carafate recommended.  She is feeling much better now.  Her abdominal bloating and discomfort has resolved.  Much less belching.  No heartburn.  She ran out of pantoprazole about 1 month ago.  She feels like she needs to be on the medication.  Bowel movements are regular.  No melena or rectal bleeding.  She still waiting to hear from her insurance company about injectable osteoporosis medications.  She did not tolerate Fosamax due to GI intolerance.    Current Outpatient Medications  Medication Sig Dispense Refill  . b complex vitamins tablet Take 1 tablet by mouth daily.    Marland Kitchen CALCIUM-VITAMIN D PO Take 1 tablet by mouth 2 (two) times daily.    . diclofenac sodium (VOLTAREN) 1 % GEL Apply 1 application topically daily as needed (pain).     . ferrous sulfate 325 (65 FE) MG tablet Take 325 mg by mouth daily as needed (fatigue).     . hydrOXYzine (ATARAX/VISTARIL) 25 MG tablet Take 25 mg by mouth every 8 (eight) hours as needed for anxiety.     . Multiple Vitamin (MULTIVITAMIN WITH MINERALS) TABS tablet Take 1 tablet by mouth daily.    Marland Kitchen oxyCODONE (ROXICODONE) 15 MG immediate release tablet Take 15 mg by mouth every 6 (six) hours as needed (for pain).     . sucralfate (CARAFATE) 1 g tablet Take 1 tablet by mouth 2 (two) times daily.    . vitamin C (ASCORBIC ACID) 500 MG tablet Take 500 mg by mouth daily.    Marland Kitchen zolpidem  (AMBIEN) 10 MG tablet Take 10 mg by mouth at bedtime.      No current facility-administered medications for this visit.     Allergies as of 09/28/2019 - Review Complete 09/28/2019  Allergen Reaction Noted  . Ampicillin Other (See Comments)   . Iodine Swelling and Other (See Comments)     ROS:  General: Negative for anorexia, weight loss, fever, chills, fatigue, weakness. ENT: Negative for hoarseness, difficulty swallowing , nasal congestion. CV: Negative for chest pain, angina, palpitations, dyspnea on exertion, peripheral edema.  Respiratory: Negative for dyspnea at rest, dyspnea on exertion, cough, sputum, wheezing.  GI: See history of present illness. GU:  Negative for dysuria, hematuria, urinary incontinence, urinary frequency, nocturnal urination.  Endo: Negative for unusual weight change.    Physical Examination:   BP (!) 119/59   Pulse 62   Temp (!) 97.2 F (36.2 C) (Oral)   Ht 5\' 2"  (1.575 m)   Wt 144 lb (65.3 kg)   BMI 26.34 kg/m   General: Well-nourished, well-developed in no acute distress.  Eyes: No icterus. Mouth: Oropharyngeal mucosa moist and pink , no lesions erythema or exudate. Lungs: Clear to auscultation bilaterally.  Heart: Regular rate and rhythm, no murmurs rubs or gallops.  Abdomen: Bowel sounds are normal, nontender, nondistended, no hepatosplenomegaly or masses, no abdominal bruits or hernia ,  no rebound or guarding.   Extremities: No lower extremity edema. No clubbing or deformities. Neuro: Alert and oriented x 4   Skin: Warm and dry, no jaundice.   Psych: Alert and cooperative, normal mood and affect.   Imaging Studies: Dg Bone Density (dxa)  Result Date: 09/23/2019 EXAM: DUAL X-RAY ABSORPTIOMETRY (DXA) FOR BONE MINERAL DENSITY IMPRESSION: Your patient [name] completed a BMD test on 09/23/2019 using the [place] (software version: 14.10) manufactured by [employer]. The following summarizes the results of  our evaluation. Technologist:[facility] PATIENT BIOGRAPHICAL: Name: [name] Patient ID: [id number] Birth Date: December 17, 1950 Height: 61.5 in. Gender: Female Exam Date: 09/23/2019 Weight: 142.0 lbs. Indications: Back surgery, Bilateral Oophrectomy, Caucasian, Follow up Osteoporosis, Height Loss, History of Fracture (Adult), Post Menopausal Fractures: Hip Treatments: Multivitamin, Vitamin D DENSITOMETRY RESULTS: Site         Region     Measured Date Measured Age WHO Classification Young Adult T-score BMD         %Change vs. Previous Significant Change (*) AP Spine L1-L2 09/23/2019 69.9 Normal -0.7 1.078 g/cm2 8.5% Yes AP Spine L1-L2 07/20/2014 64.7 Osteopenia -1.4 0.994 g/cm2 -6.4% Yes AP Spine L1-L2 03/01/2010 60.3 Normal -0.9 1.062 g/cm2 - - Right Femur Neck 09/23/2019 69.9 Osteoporosis -2.7 0.661 g/cm2 -2.4% - Right Femur Neck 07/20/2014 64.7 Osteoporosis -2.6 0.677 g/cm2 -6.2% Yes Right Femur Neck 03/01/2010 60.3 Osteopenia -2.3 0.722 g/cm2 - - Right Femur Total 09/23/2019 69.9 Osteopenia -1.9 0.764 g/cm2 -1.5% - Right Femur Total 07/20/2014 64.7 Osteopenia -1.8 0.776 g/cm2 -4.7% Yes Right Femur Total 03/01/2010 60.3 Osteopenia -1.5 0.814 g/cm2 - - Left Forearm Radius 33% 09/23/2019 69.9 Osteopenia -1.2 0.631 g/cm2 - - ASSESSMENT: The BMD measured at Femur Neck is 0.661 g/cm2 with a T-score of -2.7. This patient is considered osteoporotic according to [facility]) criteria. The scan quality is good. L3 and L4 were excluded due to surgical repair. Compared with the prior study on 07/20/2014, the BMD of the lumbar spine shows a statistically significant increase. The lt. hip was excluded due to surgical hardware. World Pharmac[employer] [facility]) criteria for post-menopausal, Caucasian Women: Normal:       T-score at or above -1 SD Osteopenia:   T-score between -1 and -2.5 SD Osteoporosis: T-score at or below -2.5 SD RECOMMENDATIONS: 1. All patients should optimize calcium and vitamin D intake. 2.  Consider FDA-approved medical therapies in postmenopausal women and med aged 1 years and older, based on the following: a. A hip or vertebral (clinical or morphometric) fracture b. T-score< -2.5 at the femoral neck or spine after appropriate evaluation to exclude secondary causes c. Low bone mass (T-score between -1.0 and -2.5 at the femoral neck or spine) and a 10-year probability of a hip fracture > 3% or a 10-year probability of a major osteoporosis-related fracture > 20% based on the [place]-adapted WHO algorithm d. Clinician judgment and/or patient preferences may indicate treatment for people with 10-year fracture probabilities above or below these levels FOLLOW-UP: People with diagnosed cases of osteoporosis or at high risk for fracture should have regular bone mineral density tests. For patients eligible for Medicare, routine testing is allowed once every 2 years. The testing frequency can be increased to one year for patients who have rapidly progressing disease, those who are receiving or discontinuing medical therapy to restore bone mass, or have additional risk factors. Electronically Signed   By: Marlaine Hind M.D.   On: 09/23/2019 16:22

## 2019-10-03 DIAGNOSIS — M810 Age-related osteoporosis without current pathological fracture: Secondary | ICD-10-CM | POA: Diagnosis not present

## 2019-10-03 DIAGNOSIS — Z23 Encounter for immunization: Secondary | ICD-10-CM | POA: Diagnosis not present

## 2019-10-03 DIAGNOSIS — M5000 Cervical disc disorder with myelopathy, unspecified cervical region: Secondary | ICD-10-CM | POA: Diagnosis not present

## 2019-10-03 DIAGNOSIS — G894 Chronic pain syndrome: Secondary | ICD-10-CM | POA: Diagnosis not present

## 2019-10-03 DIAGNOSIS — Z6826 Body mass index (BMI) 26.0-26.9, adult: Secondary | ICD-10-CM | POA: Diagnosis not present

## 2019-10-10 ENCOUNTER — Ambulatory Visit (HOSPITAL_COMMUNITY)
Admission: RE | Admit: 2019-10-10 | Discharge: 2019-10-10 | Disposition: A | Discharge status: Home / Self Care | Payer: Medicare HMO | Source: Ambulatory Visit | Attending: Internal Medicine | Admitting: Internal Medicine

## 2019-10-10 ENCOUNTER — Other Ambulatory Visit: Payer: Self-pay

## 2019-10-10 DIAGNOSIS — Z1231 Encounter for screening mammogram for malignant neoplasm of breast: Secondary | ICD-10-CM | POA: Diagnosis not present

## 2019-11-02 DIAGNOSIS — C44729 Squamous cell carcinoma of skin of left lower limb, including hip: Secondary | ICD-10-CM | POA: Diagnosis not present

## 2019-11-02 DIAGNOSIS — C44529 Squamous cell carcinoma of skin of other part of trunk: Secondary | ICD-10-CM | POA: Diagnosis not present

## 2019-11-07 DIAGNOSIS — Z6830 Body mass index (BMI) 30.0-30.9, adult: Secondary | ICD-10-CM | POA: Diagnosis not present

## 2019-11-07 DIAGNOSIS — M5000 Cervical disc disorder with myelopathy, unspecified cervical region: Secondary | ICD-10-CM | POA: Diagnosis not present

## 2019-11-07 DIAGNOSIS — G4700 Insomnia, unspecified: Secondary | ICD-10-CM | POA: Diagnosis not present

## 2019-11-07 DIAGNOSIS — G894 Chronic pain syndrome: Secondary | ICD-10-CM | POA: Diagnosis not present

## 2019-11-28 ENCOUNTER — Encounter: Payer: Self-pay | Admitting: Cardiovascular Disease

## 2019-11-28 ENCOUNTER — Ambulatory Visit (INDEPENDENT_AMBULATORY_CARE_PROVIDER_SITE_OTHER): Payer: Medicare HMO | Admitting: Cardiovascular Disease

## 2019-11-28 ENCOUNTER — Other Ambulatory Visit: Payer: Self-pay

## 2019-11-28 VITALS — BP 132/84 | HR 60 | Ht 62.0 in | Wt 147.0 lb

## 2019-11-28 DIAGNOSIS — I495 Sick sinus syndrome: Secondary | ICD-10-CM

## 2019-11-28 DIAGNOSIS — I4719 Other supraventricular tachycardia: Secondary | ICD-10-CM

## 2019-11-28 DIAGNOSIS — E781 Pure hyperglyceridemia: Secondary | ICD-10-CM | POA: Diagnosis not present

## 2019-11-28 DIAGNOSIS — Z950 Presence of cardiac pacemaker: Secondary | ICD-10-CM

## 2019-11-28 DIAGNOSIS — I471 Supraventricular tachycardia: Secondary | ICD-10-CM

## 2019-11-28 NOTE — Progress Notes (Signed)
Cardiology Office Note    Date:  11/29/2019   ID:  [name], DOB Mar 17, 1951, MRN [id number]  PCP:  Redmond School, MD  Cardiologist:   Sanda Klein, MD   Chief complaint: pacemaker check  History of Present Illness:  [name] is a 68 y.o. female with tachycardia-bradycardia syndrome (sinus bradycardia and paroxysmal atrial tachycardia) with a dual-chamber permanent pacemaker ([employer]. [employer] Accent, implanted 2012) returning for pacemaker follow-up.   She generally feels well, but her usual problems of anxiety have been amplified by the coronavirus pandemic.  The patient specifically denies any chest pain at rest exertion, dyspnea at rest or with exertion, orthopnea, paroxysmal nocturnal dyspnea, syncope, palpitations, focal neurological deficits, intermittent claudication, lower extremity edema, unexplained weight gain, cough, hemoptysis or wheezing.  Interrogation of her pacemaker today shows normal device function. The estimated generator longevity is 2.5-2.8 years. There is only 43 % atrial pacing and less than 1 1% ventricular pacing.  She has infrequent episodes of paroxysmal atrial tachycardia, never longer than 10 seconds.    Past Medical History:  Diagnosis Date  . Anxiety   . Diverticulosis of colon   . Dysrhythmia    AFib  . History of kidney stones   . Osteoarthritis   . Osteoporosis   . Paroxysmal atrial fibrillation (HCC)    with SVT response  . Presence of permanent cardiac pacemaker   . Sinus node dysfunction (HCC)   . Tachy-brady syndrome [facility])     Past Surgical History:  Procedure Laterality Date  . ABDOMINAL HYSTERECTOMY    . APPENDECTOMY    . BREAST BIOPSY     right, benign  . CERVICAL SPINE SURGERY     two  . CHOLECYSTECTOMY    . COLONOSCOPY  2007   sigmoid diverticula, few ulcerations of terminal ileum, biopsies of TI unremarkable, random colon bx neg for microscopic colitis.  . COLONOSCOPY  06/04/11   Dr. Gala Romney- pegmentation of the  rectum, pancolonic diverticula, solitary ulcer in the mouth of the ileocecal valve, distal 15 cm of the terminal ileum appeared normal. Random colon biopsies were benign. Ileocecal valve ulcer or was benign without any supportive evidence of inflammatory bowel disease.  Marland Kitchen COLONOSCOPY WITH PROPOFOL N/A 04/26/2018   Dr. Gala Romney: diverticulosis, no future screening colonoscopies planned due to age.  . ESOPHAGOGASTRODUODENOSCOPY  06/04/11   Dr. Vivi Ferns esophagus, deffuse petechial and gastric submucosal petechiae- benign mucousa on bx. Small bowel biopsy benign. No H. pylori.  . ESOPHAGOGASTRODUODENOSCOPY (EGD) WITH PROPOFOL N/A 07/28/2019   Dr. Emerson Monte: Small hiatal hernia, dyspepsia likely due to Fosamax  . INSERT / REPLACE / REMOVE PACEMAKER    . left hip surgery     in [place], around 2018  . PERMANENT PACEMAKER INSERTION  07/2011  . s/p hysterectomy    . sb capsule study  2009   Two single small nonbleeding AVMs in the  proximal small bowel, single lymphangiectasia in mid small bowel.  . vocal cord polypectomy      Current Medications: Outpatient Medications Prior to Visit  Medication Sig Dispense Refill  . b complex vitamins tablet Take 1 tablet by mouth daily.    Marland Kitchen CALCIUM-VITAMIN D PO Take 1 tablet by mouth 2 (two) times daily.    . diclofenac sodium (VOLTAREN) 1 % GEL Apply 1 application topically daily as needed (pain).     . ferrous sulfate 325 (65 FE) MG tablet Take 325 mg by mouth daily as needed (fatigue).     Marland Kitchen  hydrOXYzine (ATARAX/VISTARIL) 25 MG tablet Take 25 mg by mouth every 8 (eight) hours as needed for anxiety.     . Multiple Vitamin (MULTIVITAMIN WITH MINERALS) TABS tablet Take 1 tablet by mouth daily.    Marland Kitchen oxyCODONE (ROXICODONE) 15 MG immediate release tablet Take 15 mg by mouth every 6 (six) hours as needed (for pain).     . pantoprazole (PROTONIX) 40 MG tablet Take 1 tablet (40 mg total) by mouth daily before breakfast. 30 tablet 11  . vitamin C (ASCORBIC ACID) 500 MG  tablet Take 500 mg by mouth daily.    Marland Kitchen zolpidem (AMBIEN) 10 MG tablet Take 10 mg by mouth at bedtime.     . sucralfate (CARAFATE) 1 g tablet Take 1 tablet by mouth 2 (two) times daily.     No facility-administered medications prior to visit.     Allergies:   Ampicillin and Iodine   Social History   Socioeconomic History  . Marital status: Divorced    Spouse name: Not on file  . Number of children: Not on file  . Years of education: Not on file  . Highest education level: Not on file  Occupational History  . Occupation: [employer]  Tobacco Use  . Smoking status: Former Smoker    Types: Cigarettes    Quit date: 12/15/1973    Years since quitting: 45.9  . Smokeless tobacco: Never Used  . Tobacco comment: occas  Substance and Sexual Activity  . Alcohol use: No  . Drug use: No  . Sexual activity: Not on file  Other Topics Concern  . Not on file  Social History Narrative   Has one son   Social Determinants of Health   Financial Resource Strain:   . Difficulty of Paying Living Expenses: Not on file  Food Insecurity:   . Worried About Charity fundraiser in the Last Year: Not on file  . Ran Out of Food in the Last Year: Not on file  Transportation Needs:   . Lack of Transportation (Medical): Not on file  . Lack of Transportation (Non-Medical): Not on file  Physical Activity:   . Days of Exercise per Week: Not on file  . Minutes of Exercise per Session: Not on file  Stress:   . Feeling of Stress : Not on file  Social Connections:   . Frequency of Communication with Friends and Family: Not on file  . Frequency of Social Gatherings with Friends and Family: Not on file  . Attends Religious Services: Not on file  . Active Member of Clubs or Organizations: Not on file  . Attends Archivist Meetings: Not on file  . Marital Status: Not on file     Family History:  The patient's family history includes COPD (age of onset: 46) in her mother; Diabetes in her brother; GI  problems in her sister; Heart attack (age of onset: 57) in her father; Hypertension in her brother; Stroke (age of onset: 66) in her father.   ROS:   Please see the history of present illness.    ROS All other systems are reviewed and are negative.   PHYSICAL EXAM:   VS:  BP 132/84   Pulse 60   Ht 5\' 2"  (1.575 m)   Wt 147 lb (66.7 kg)   BMI 26.89 kg/m     General: Alert, oriented x3, no distress, healthy left subclavian pacemaker site Head: no evidence of trauma, PERRL, EOMI, no exophtalmos or lid lag, no myxedema, no  xanthelasma; normal ears, nose and oropharynx Neck: normal jugular venous pulsations and no hepatojugular reflux; brisk carotid pulses without delay and no carotid bruits Chest: clear to auscultation, no signs of consolidation by percussion or palpation, normal fremitus, symmetrical and full respiratory excursions Cardiovascular: normal position and quality of the apical impulse, regular rhythm, normal first and second heart sounds, no murmurs, rubs or gallops Abdomen: no tenderness or distention, no masses by palpation, no abnormal pulsatility or arterial bruits, normal bowel sounds, no hepatosplenomegaly Extremities: no clubbing, cyanosis or edema; 2+ radial, ulnar and brachial pulses bilaterally; 2+ right femoral, posterior tibial and dorsalis pedis pulses; 2+ left femoral, posterior tibial and dorsalis pedis pulses; no subclavian or femoral bruits Neurological: grossly nonfocal Psych: Normal mood and affect    Wt Readings from Last 3 Encounters:  11/28/19 147 lb (66.7 kg)  09/28/19 144 lb (65.3 kg)  07/28/19 142 lb (64.4 kg)      Studies/Labs Reviewed:   EKG:  EKG is ordered today.  It shows atrial paced, ventricular sensed rhythm, otherwise normal tracing, QTC 412 ms  Recent Labs: No results found for requested labs within last 8760 hours.  09/13/2019 potassium 3.7, creatinine 0.8, hemoglobin A1c 7% Lipid Panel    Component Value Date/Time   CHOL 156  07/17/2011 0605   TRIG 107 07/17/2011 0605   HDL 52 07/17/2011 0605   CHOLHDL 3.0 07/17/2011 0605   VLDL 21 07/17/2011 0605   LDLCALC 83 07/17/2011 0605   09/13/2019 total cholesterol 130, HDL 53, LDL 54, triglycerides 255  ASSESSMENT:    1. SSS (sick sinus syndrome) ([place])   2. PAT (paroxysmal atrial tachycardia) ([place])   3. Pacemaker   4. Hypertriglyceridemia      PLAN:  In order of problems listed above:  1. SSS: No signs or symptoms of chronotropic incompetence, her heart rate histograms appear appropriate for her activity level. 2. PAT: The episodes are rare and brief and always asymptomatic and do not require treatment. 3. PPM: Normal device function, continue remote downloads every 3 months. 4. HLP: All her lipid parameters are within target range except for the triglycerides (although I suspect this might have been because she was not truly fasting when the labs were drawn).  Avoid excessive carbs in her diet.  She is only mildly overweight.   Medication Adjustments/Labs and Tests Ordered: Current medicines are reviewed at length with the patient today.  Concerns regarding medicines are outlined above.  Medication changes, Labs and Tests ordered today are listed in the Patient Instructions below. Patient Instructions  Medication Instructions:  No changes *If you need a refill on your cardiac medications before your next appointment, please call your pharmacy*  Lab Work: None ordered If you have labs (blood work) drawn today and your tests are completely normal, you will receive your results only by: Marland Kitchen MyChart Message (if you have MyChart) OR . A paper copy in the mail If you have any lab test that is abnormal or we need to change your treatment, we will call you to review the results.  Testing/Procedures: None ordered  Follow-Up: At [facility], you and your health needs are our priority.  As part of our continuing mission to provide you with exceptional heart  care, we have created designated Provider Care Teams.  These Care Teams include your primary Cardiologist (physician) and Advanced Practice Providers (APPs -  Physician Assistants and Nurse Practitioners) who all work together to provide you with the care you need, when you need  it.  Your next appointment:   12 month(s)  The format for your next appointment:   In Person  Provider:   Sanda Klein, MD       Signed, Sanda Klein, MD  11/29/2019 5:44 PM    [address], [place], [place]  [address] Phone: [phone]; Fax: [phone] ,mg

## 2019-11-28 NOTE — Patient Instructions (Signed)

## 2019-11-29 ENCOUNTER — Encounter: Payer: Self-pay | Admitting: Cardiovascular Disease

## 2019-12-12 ENCOUNTER — Ambulatory Visit (INDEPENDENT_AMBULATORY_CARE_PROVIDER_SITE_OTHER): Payer: Medicare HMO | Admitting: *Deleted

## 2019-12-12 DIAGNOSIS — I495 Sick sinus syndrome: Secondary | ICD-10-CM

## 2019-12-13 LAB — CUP PACEART REMOTE DEVICE CHECK
Battery Remaining Longevity: 32 mo
Battery Remaining Percentage: 25 %
Battery Voltage: 2.8 V
Brady Statistic AP VP Percent: 1 %
Brady Statistic AP VS Percent: 26 %
Brady Statistic AS VP Percent: 1 %
Brady Statistic AS VS Percent: 73 %
Brady Statistic RA Percent Paced: 26 %
Brady Statistic RV Percent Paced: 1 %
Date Time Interrogation Session: 20201228182552
Implantable Lead Implant Date: 20120802
Implantable Lead Implant Date: 20120802
Implantable Lead Location: 753859
Implantable Lead Location: 753860
Implantable Pulse Generator Implant Date: 20120802
Lead Channel Impedance Value: 450 Ohm
Lead Channel Impedance Value: 490 Ohm
Lead Channel Pacing Threshold Amplitude: 0.5 V
Lead Channel Pacing Threshold Amplitude: 1.125 V
Lead Channel Pacing Threshold Pulse Width: 0.5 ms
Lead Channel Pacing Threshold Pulse Width: 0.5 ms
Lead Channel Sensing Intrinsic Amplitude: 1.7 mV
Lead Channel Sensing Intrinsic Amplitude: 3.7 mV
Lead Channel Setting Pacing Amplitude: 1.375
Lead Channel Setting Pacing Amplitude: 1.5 V
Lead Channel Setting Pacing Pulse Width: 0.5 ms
Lead Channel Setting Sensing Sensitivity: 1 mV
Pulse Gen Model: 2210
Pulse Gen Serial Number: 7250678

## 2019-12-15 DIAGNOSIS — M810 Age-related osteoporosis without current pathological fracture: Secondary | ICD-10-CM | POA: Diagnosis not present

## 2019-12-15 DIAGNOSIS — F4542 Pain disorder with related psychological factors: Secondary | ICD-10-CM | POA: Diagnosis not present

## 2019-12-15 DIAGNOSIS — G894 Chronic pain syndrome: Secondary | ICD-10-CM | POA: Diagnosis not present

## 2019-12-15 DIAGNOSIS — E7849 Other hyperlipidemia: Secondary | ICD-10-CM | POA: Diagnosis not present

## 2019-12-20 DIAGNOSIS — G894 Chronic pain syndrome: Secondary | ICD-10-CM | POA: Diagnosis not present

## 2019-12-20 DIAGNOSIS — E663 Overweight: Secondary | ICD-10-CM | POA: Diagnosis not present

## 2019-12-20 DIAGNOSIS — Z6826 Body mass index (BMI) 26.0-26.9, adult: Secondary | ICD-10-CM | POA: Diagnosis not present

## 2019-12-20 DIAGNOSIS — G4700 Insomnia, unspecified: Secondary | ICD-10-CM | POA: Diagnosis not present

## 2020-01-19 DIAGNOSIS — M150 Primary generalized (osteo)arthritis: Secondary | ICD-10-CM | POA: Diagnosis not present

## 2020-01-19 DIAGNOSIS — G894 Chronic pain syndrome: Secondary | ICD-10-CM | POA: Diagnosis not present

## 2020-01-19 DIAGNOSIS — M47816 Spondylosis without myelopathy or radiculopathy, lumbar region: Secondary | ICD-10-CM | POA: Diagnosis not present

## 2020-01-19 DIAGNOSIS — Z6827 Body mass index (BMI) 27.0-27.9, adult: Secondary | ICD-10-CM | POA: Diagnosis not present

## 2020-02-12 DIAGNOSIS — M810 Age-related osteoporosis without current pathological fracture: Secondary | ICD-10-CM | POA: Diagnosis not present

## 2020-02-12 DIAGNOSIS — F4542 Pain disorder with related psychological factors: Secondary | ICD-10-CM | POA: Diagnosis not present

## 2020-02-12 DIAGNOSIS — E7849 Other hyperlipidemia: Secondary | ICD-10-CM | POA: Diagnosis not present

## 2020-02-12 DIAGNOSIS — G894 Chronic pain syndrome: Secondary | ICD-10-CM | POA: Diagnosis not present

## 2020-02-16 DIAGNOSIS — Z6826 Body mass index (BMI) 26.0-26.9, adult: Secondary | ICD-10-CM | POA: Diagnosis not present

## 2020-02-16 DIAGNOSIS — J309 Allergic rhinitis, unspecified: Secondary | ICD-10-CM | POA: Diagnosis not present

## 2020-02-16 DIAGNOSIS — G4700 Insomnia, unspecified: Secondary | ICD-10-CM | POA: Diagnosis not present

## 2020-02-16 DIAGNOSIS — G894 Chronic pain syndrome: Secondary | ICD-10-CM | POA: Diagnosis not present

## 2020-02-16 DIAGNOSIS — I2510 Atherosclerotic heart disease of native coronary artery without angina pectoris: Secondary | ICD-10-CM | POA: Diagnosis not present

## 2020-02-16 DIAGNOSIS — I700 Atherosclerosis of aorta: Secondary | ICD-10-CM | POA: Diagnosis not present

## 2020-02-16 DIAGNOSIS — F329 Major depressive disorder, single episode, unspecified: Secondary | ICD-10-CM | POA: Diagnosis not present

## 2020-02-16 DIAGNOSIS — I4891 Unspecified atrial fibrillation: Secondary | ICD-10-CM | POA: Diagnosis not present

## 2020-02-16 DIAGNOSIS — Z0001 Encounter for general adult medical examination with abnormal findings: Secondary | ICD-10-CM | POA: Diagnosis not present

## 2020-02-16 DIAGNOSIS — E7849 Other hyperlipidemia: Secondary | ICD-10-CM | POA: Diagnosis not present

## 2020-02-16 DIAGNOSIS — F419 Anxiety disorder, unspecified: Secondary | ICD-10-CM | POA: Diagnosis not present

## 2020-02-17 DIAGNOSIS — Z1389 Encounter for screening for other disorder: Secondary | ICD-10-CM | POA: Diagnosis not present

## 2020-02-17 DIAGNOSIS — Z6826 Body mass index (BMI) 26.0-26.9, adult: Secondary | ICD-10-CM | POA: Diagnosis not present

## 2020-02-17 DIAGNOSIS — E663 Overweight: Secondary | ICD-10-CM | POA: Diagnosis not present

## 2020-03-12 ENCOUNTER — Ambulatory Visit (INDEPENDENT_AMBULATORY_CARE_PROVIDER_SITE_OTHER): Payer: PPO | Admitting: *Deleted

## 2020-03-12 DIAGNOSIS — I495 Sick sinus syndrome: Secondary | ICD-10-CM | POA: Diagnosis not present

## 2020-03-12 LAB — CUP PACEART REMOTE DEVICE CHECK
Battery Remaining Longevity: 28 mo
Battery Remaining Percentage: 22 %
Battery Voltage: 2.78 V
Brady Statistic AP VP Percent: 1 %
Brady Statistic AP VS Percent: 43 %
Brady Statistic AS VP Percent: 1 %
Brady Statistic AS VS Percent: 56 %
Brady Statistic RA Percent Paced: 43 %
Brady Statistic RV Percent Paced: 1 %
Date Time Interrogation Session: 20210329020626
Implantable Lead Implant Date: 20120802
Implantable Lead Implant Date: 20120802
Implantable Lead Location: 753859
Implantable Lead Location: 753860
Implantable Pulse Generator Implant Date: 20120802
Lead Channel Impedance Value: 450 Ohm
Lead Channel Impedance Value: 480 Ohm
Lead Channel Pacing Threshold Amplitude: 0.375 V
Lead Channel Pacing Threshold Amplitude: 1.25 V
Lead Channel Pacing Threshold Pulse Width: 0.5 ms
Lead Channel Pacing Threshold Pulse Width: 0.5 ms
Lead Channel Sensing Intrinsic Amplitude: 2.7 mV
Lead Channel Sensing Intrinsic Amplitude: 4.6 mV
Lead Channel Setting Pacing Amplitude: 1.375
Lead Channel Setting Pacing Amplitude: 1.5 V
Lead Channel Setting Pacing Pulse Width: 0.5 ms
Lead Channel Setting Sensing Sensitivity: 1 mV
Pulse Gen Model: 2210
Pulse Gen Serial Number: 7250678

## 2020-03-12 NOTE — Progress Notes (Signed)
PPM Remote  

## 2020-03-19 DIAGNOSIS — M5000 Cervical disc disorder with myelopathy, unspecified cervical region: Secondary | ICD-10-CM | POA: Diagnosis not present

## 2020-03-19 DIAGNOSIS — G4700 Insomnia, unspecified: Secondary | ICD-10-CM | POA: Diagnosis not present

## 2020-03-19 DIAGNOSIS — Z6826 Body mass index (BMI) 26.0-26.9, adult: Secondary | ICD-10-CM | POA: Diagnosis not present

## 2020-03-19 DIAGNOSIS — G894 Chronic pain syndrome: Secondary | ICD-10-CM | POA: Diagnosis not present

## 2020-04-18 DIAGNOSIS — Z6826 Body mass index (BMI) 26.0-26.9, adult: Secondary | ICD-10-CM | POA: Diagnosis not present

## 2020-04-18 DIAGNOSIS — E663 Overweight: Secondary | ICD-10-CM | POA: Diagnosis not present

## 2020-04-18 DIAGNOSIS — G894 Chronic pain syndrome: Secondary | ICD-10-CM | POA: Diagnosis not present

## 2020-04-18 DIAGNOSIS — E063 Autoimmune thyroiditis: Secondary | ICD-10-CM | POA: Diagnosis not present

## 2020-04-18 DIAGNOSIS — M5412 Radiculopathy, cervical region: Secondary | ICD-10-CM | POA: Diagnosis not present

## 2020-06-11 ENCOUNTER — Ambulatory Visit (INDEPENDENT_AMBULATORY_CARE_PROVIDER_SITE_OTHER): Payer: PPO | Admitting: *Deleted

## 2020-06-11 DIAGNOSIS — I495 Sick sinus syndrome: Secondary | ICD-10-CM

## 2020-06-12 LAB — CUP PACEART REMOTE DEVICE CHECK
Battery Remaining Longevity: 20 mo
Battery Remaining Percentage: 17 %
Battery Voltage: 2.75 V
Brady Statistic AP VP Percent: 1 %
Brady Statistic AP VS Percent: 38 %
Brady Statistic AS VP Percent: 1 %
Brady Statistic AS VS Percent: 61 %
Brady Statistic RA Percent Paced: 38 %
Brady Statistic RV Percent Paced: 1 %
Date Time Interrogation Session: 20210628023737
Implantable Lead Implant Date: 20120802
Implantable Lead Implant Date: 20120802
Implantable Lead Location: 753859
Implantable Lead Location: 753860
Implantable Pulse Generator Implant Date: 20120802
Lead Channel Impedance Value: 450 Ohm
Lead Channel Impedance Value: 480 Ohm
Lead Channel Pacing Threshold Amplitude: 0.5 V
Lead Channel Pacing Threshold Amplitude: 1.25 V
Lead Channel Pacing Threshold Pulse Width: 0.5 ms
Lead Channel Pacing Threshold Pulse Width: 0.5 ms
Lead Channel Sensing Intrinsic Amplitude: 1 mV
Lead Channel Sensing Intrinsic Amplitude: 4.1 mV
Lead Channel Setting Pacing Amplitude: 1.5 V
Lead Channel Setting Pacing Amplitude: 1.5 V
Lead Channel Setting Pacing Pulse Width: 0.5 ms
Lead Channel Setting Sensing Sensitivity: 1 mV
Pulse Gen Model: 2210
Pulse Gen Serial Number: 7250678

## 2020-06-12 NOTE — Progress Notes (Signed)
Remote pacemaker transmission.   

## 2020-06-13 DIAGNOSIS — F4542 Pain disorder with related psychological factors: Secondary | ICD-10-CM | POA: Diagnosis not present

## 2020-06-13 DIAGNOSIS — E7849 Other hyperlipidemia: Secondary | ICD-10-CM | POA: Diagnosis not present

## 2020-06-13 DIAGNOSIS — G894 Chronic pain syndrome: Secondary | ICD-10-CM | POA: Diagnosis not present

## 2020-06-13 DIAGNOSIS — M810 Age-related osteoporosis without current pathological fracture: Secondary | ICD-10-CM | POA: Diagnosis not present

## 2020-07-19 DIAGNOSIS — G894 Chronic pain syndrome: Secondary | ICD-10-CM | POA: Diagnosis not present

## 2020-07-19 DIAGNOSIS — K219 Gastro-esophageal reflux disease without esophagitis: Secondary | ICD-10-CM | POA: Diagnosis not present

## 2020-07-19 DIAGNOSIS — Z6823 Body mass index (BMI) 23.0-23.9, adult: Secondary | ICD-10-CM | POA: Diagnosis not present

## 2020-07-19 DIAGNOSIS — F419 Anxiety disorder, unspecified: Secondary | ICD-10-CM | POA: Diagnosis not present

## 2020-08-21 DIAGNOSIS — G894 Chronic pain syndrome: Secondary | ICD-10-CM | POA: Diagnosis not present

## 2020-09-10 ENCOUNTER — Ambulatory Visit (INDEPENDENT_AMBULATORY_CARE_PROVIDER_SITE_OTHER): Payer: PPO | Admitting: Emergency Medicine

## 2020-09-10 DIAGNOSIS — I495 Sick sinus syndrome: Secondary | ICD-10-CM | POA: Diagnosis not present

## 2020-09-11 LAB — CUP PACEART REMOTE DEVICE CHECK
Battery Remaining Longevity: 16 mo
Battery Remaining Percentage: 12 %
Battery Voltage: 2.72 V
Brady Statistic AP VP Percent: 1 %
Brady Statistic AP VS Percent: 42 %
Brady Statistic AS VP Percent: 1 %
Brady Statistic AS VS Percent: 58 %
Brady Statistic RA Percent Paced: 42 %
Brady Statistic RV Percent Paced: 1 %
Date Time Interrogation Session: 20210927020023
Implantable Lead Implant Date: 20120802
Implantable Lead Implant Date: 20120802
Implantable Lead Location: 753859
Implantable Lead Location: 753860
Implantable Pulse Generator Implant Date: 20120802
Lead Channel Impedance Value: 450 Ohm
Lead Channel Impedance Value: 490 Ohm
Lead Channel Pacing Threshold Amplitude: 0.5 V
Lead Channel Pacing Threshold Amplitude: 1.25 V
Lead Channel Pacing Threshold Pulse Width: 0.5 ms
Lead Channel Pacing Threshold Pulse Width: 0.5 ms
Lead Channel Sensing Intrinsic Amplitude: 1.8 mV
Lead Channel Sensing Intrinsic Amplitude: 5.5 mV
Lead Channel Setting Pacing Amplitude: 1.5 V
Lead Channel Setting Pacing Amplitude: 1.5 V
Lead Channel Setting Pacing Pulse Width: 0.5 ms
Lead Channel Setting Sensing Sensitivity: 1 mV
Pulse Gen Model: 2210
Pulse Gen Serial Number: 7250678

## 2020-09-13 DIAGNOSIS — E7849 Other hyperlipidemia: Secondary | ICD-10-CM | POA: Diagnosis not present

## 2020-09-13 DIAGNOSIS — F4542 Pain disorder with related psychological factors: Secondary | ICD-10-CM | POA: Diagnosis not present

## 2020-09-13 DIAGNOSIS — M810 Age-related osteoporosis without current pathological fracture: Secondary | ICD-10-CM | POA: Diagnosis not present

## 2020-09-13 DIAGNOSIS — G894 Chronic pain syndrome: Secondary | ICD-10-CM | POA: Diagnosis not present

## 2020-09-13 NOTE — Progress Notes (Signed)
Remote pacemaker transmission.   

## 2020-09-20 DIAGNOSIS — I471 Supraventricular tachycardia: Secondary | ICD-10-CM | POA: Diagnosis not present

## 2020-09-20 DIAGNOSIS — G894 Chronic pain syndrome: Secondary | ICD-10-CM | POA: Diagnosis not present

## 2020-09-20 DIAGNOSIS — M1991 Primary osteoarthritis, unspecified site: Secondary | ICD-10-CM | POA: Diagnosis not present

## 2020-09-20 DIAGNOSIS — Z23 Encounter for immunization: Secondary | ICD-10-CM | POA: Diagnosis not present

## 2020-09-20 DIAGNOSIS — Z6823 Body mass index (BMI) 23.0-23.9, adult: Secondary | ICD-10-CM | POA: Diagnosis not present

## 2020-09-20 DIAGNOSIS — E782 Mixed hyperlipidemia: Secondary | ICD-10-CM | POA: Diagnosis not present

## 2020-09-20 DIAGNOSIS — R634 Abnormal weight loss: Secondary | ICD-10-CM | POA: Diagnosis not present

## 2020-09-21 DIAGNOSIS — Z79899 Other long term (current) drug therapy: Secondary | ICD-10-CM | POA: Diagnosis not present

## 2020-09-21 DIAGNOSIS — R634 Abnormal weight loss: Secondary | ICD-10-CM | POA: Diagnosis not present

## 2020-10-13 DIAGNOSIS — E7849 Other hyperlipidemia: Secondary | ICD-10-CM | POA: Diagnosis not present

## 2020-10-13 DIAGNOSIS — G894 Chronic pain syndrome: Secondary | ICD-10-CM | POA: Diagnosis not present

## 2020-10-13 DIAGNOSIS — M810 Age-related osteoporosis without current pathological fracture: Secondary | ICD-10-CM | POA: Diagnosis not present

## 2020-10-13 DIAGNOSIS — F4542 Pain disorder with related psychological factors: Secondary | ICD-10-CM | POA: Diagnosis not present

## 2020-10-22 DIAGNOSIS — I4891 Unspecified atrial fibrillation: Secondary | ICD-10-CM | POA: Diagnosis not present

## 2020-10-22 DIAGNOSIS — M2550 Pain in unspecified joint: Secondary | ICD-10-CM | POA: Diagnosis not present

## 2020-10-22 DIAGNOSIS — M1991 Primary osteoarthritis, unspecified site: Secondary | ICD-10-CM | POA: Diagnosis not present

## 2020-10-22 DIAGNOSIS — G894 Chronic pain syndrome: Secondary | ICD-10-CM | POA: Diagnosis not present

## 2020-10-22 DIAGNOSIS — Z6824 Body mass index (BMI) 24.0-24.9, adult: Secondary | ICD-10-CM | POA: Diagnosis not present

## 2020-11-13 DIAGNOSIS — E7849 Other hyperlipidemia: Secondary | ICD-10-CM | POA: Diagnosis not present

## 2020-11-13 DIAGNOSIS — G894 Chronic pain syndrome: Secondary | ICD-10-CM | POA: Diagnosis not present

## 2020-11-13 DIAGNOSIS — M810 Age-related osteoporosis without current pathological fracture: Secondary | ICD-10-CM | POA: Diagnosis not present

## 2020-11-13 DIAGNOSIS — F4542 Pain disorder with related psychological factors: Secondary | ICD-10-CM | POA: Diagnosis not present

## 2020-11-22 DIAGNOSIS — G894 Chronic pain syndrome: Secondary | ICD-10-CM | POA: Diagnosis not present

## 2020-11-22 DIAGNOSIS — Z6825 Body mass index (BMI) 25.0-25.9, adult: Secondary | ICD-10-CM | POA: Diagnosis not present

## 2020-11-22 DIAGNOSIS — I4891 Unspecified atrial fibrillation: Secondary | ICD-10-CM | POA: Diagnosis not present

## 2020-11-22 DIAGNOSIS — J209 Acute bronchitis, unspecified: Secondary | ICD-10-CM | POA: Diagnosis not present

## 2020-11-22 DIAGNOSIS — E063 Autoimmune thyroiditis: Secondary | ICD-10-CM | POA: Diagnosis not present

## 2020-11-22 DIAGNOSIS — Z23 Encounter for immunization: Secondary | ICD-10-CM | POA: Diagnosis not present

## 2020-11-22 DIAGNOSIS — K219 Gastro-esophageal reflux disease without esophagitis: Secondary | ICD-10-CM | POA: Diagnosis not present

## 2020-11-22 DIAGNOSIS — I471 Supraventricular tachycardia: Secondary | ICD-10-CM | POA: Diagnosis not present

## 2020-11-22 DIAGNOSIS — M1991 Primary osteoarthritis, unspecified site: Secondary | ICD-10-CM | POA: Diagnosis not present

## 2020-12-10 ENCOUNTER — Ambulatory Visit (INDEPENDENT_AMBULATORY_CARE_PROVIDER_SITE_OTHER): Payer: PPO

## 2020-12-10 DIAGNOSIS — I495 Sick sinus syndrome: Secondary | ICD-10-CM | POA: Diagnosis not present

## 2020-12-11 LAB — CUP PACEART REMOTE DEVICE CHECK
Battery Remaining Longevity: 11 mo
Battery Remaining Percentage: 8 %
Battery Voltage: 2.69 V
Brady Statistic AP VP Percent: 1 %
Brady Statistic AP VS Percent: 43 %
Brady Statistic AS VP Percent: 1 %
Brady Statistic AS VS Percent: 57 %
Brady Statistic RA Percent Paced: 42 %
Brady Statistic RV Percent Paced: 1 %
Date Time Interrogation Session: 20211227024651
Implantable Lead Implant Date: 20120802
Implantable Lead Implant Date: 20120802
Implantable Lead Location: 753859
Implantable Lead Location: 753860
Implantable Pulse Generator Implant Date: 20120802
Lead Channel Impedance Value: 460 Ohm
Lead Channel Impedance Value: 490 Ohm
Lead Channel Pacing Threshold Amplitude: 0.5 V
Lead Channel Pacing Threshold Amplitude: 1.25 V
Lead Channel Pacing Threshold Pulse Width: 0.5 ms
Lead Channel Pacing Threshold Pulse Width: 0.5 ms
Lead Channel Sensing Intrinsic Amplitude: 2.7 mV
Lead Channel Sensing Intrinsic Amplitude: 3.6 mV
Lead Channel Setting Pacing Amplitude: 1.5 V
Lead Channel Setting Pacing Amplitude: 1.5 V
Lead Channel Setting Pacing Pulse Width: 0.5 ms
Lead Channel Setting Sensing Sensitivity: 1 mV
Pulse Gen Model: 2210
Pulse Gen Serial Number: 7250678

## 2020-12-14 DIAGNOSIS — F4542 Pain disorder with related psychological factors: Secondary | ICD-10-CM | POA: Diagnosis not present

## 2020-12-14 DIAGNOSIS — G894 Chronic pain syndrome: Secondary | ICD-10-CM | POA: Diagnosis not present

## 2020-12-14 DIAGNOSIS — E7849 Other hyperlipidemia: Secondary | ICD-10-CM | POA: Diagnosis not present

## 2020-12-14 DIAGNOSIS — M810 Age-related osteoporosis without current pathological fracture: Secondary | ICD-10-CM | POA: Diagnosis not present

## 2020-12-21 NOTE — Progress Notes (Signed)
Remote pacemaker transmission.   

## 2020-12-24 DIAGNOSIS — Z0000 Encounter for general adult medical examination without abnormal findings: Secondary | ICD-10-CM | POA: Diagnosis not present

## 2020-12-24 DIAGNOSIS — G894 Chronic pain syndrome: Secondary | ICD-10-CM | POA: Diagnosis not present

## 2020-12-24 DIAGNOSIS — G4700 Insomnia, unspecified: Secondary | ICD-10-CM | POA: Diagnosis not present

## 2020-12-24 DIAGNOSIS — I700 Atherosclerosis of aorta: Secondary | ICD-10-CM | POA: Diagnosis not present

## 2020-12-24 DIAGNOSIS — J309 Allergic rhinitis, unspecified: Secondary | ICD-10-CM | POA: Diagnosis not present

## 2020-12-24 DIAGNOSIS — M47816 Spondylosis without myelopathy or radiculopathy, lumbar region: Secondary | ICD-10-CM | POA: Diagnosis not present

## 2021-01-04 ENCOUNTER — Telehealth: Payer: Self-pay | Admitting: Emergency Medicine

## 2021-01-04 NOTE — Telephone Encounter (Signed)
Attempted to contact patient by phone in reference to scheduled monthly scheduled battery checks. Left detailed message on patient's home answering machine requesting a call back at the [facility] [phone]. Last remote 12/10/2020 showing 10.6 months to ERI. Next in clinic appointment scheduled on 01/14/2021 with next monthly battery check scheduled on 02/11/2021. Patient sends manual remotes.

## 2021-01-08 NOTE — Telephone Encounter (Signed)
Made contact with patient. Last transmission received was on 12/10/2020. Showing 10.6 months battery longevity. Discussed battery longevity with patient and gave patient scheduled remote and battery check dates for manual remote transmissions.Device Clinic number given to patient if patient has any questions or concerns [phone].

## 2021-01-12 DIAGNOSIS — G894 Chronic pain syndrome: Secondary | ICD-10-CM | POA: Diagnosis not present

## 2021-01-12 DIAGNOSIS — M810 Age-related osteoporosis without current pathological fracture: Secondary | ICD-10-CM | POA: Diagnosis not present

## 2021-01-12 DIAGNOSIS — E7849 Other hyperlipidemia: Secondary | ICD-10-CM | POA: Diagnosis not present

## 2021-01-13 NOTE — Progress Notes (Unsigned)
Cardiology Office Note    Date:  01/14/2021   ID:  [name], DOB August 15, 1951, MRN [id number]  PCP:  Redmond School, MD  Cardiologist:   Sanda Klein, MD   Chief complaint: pacemaker check  History of Present Illness:  [name] is a 70 y.o. female with tachycardia-bradycardia syndrome (sinus bradycardia and paroxysmal atrial tachycardia) with a dual-chamber permanent pacemaker (St. Jude Accent, implanted 2012) returning for pacemaker follow-up.   The patient specifically denies any chest pain at rest exertion, dyspnea at rest or with exertion, orthopnea, paroxysmal nocturnal dyspnea, syncope, palpitations, focal neurological deficits, intermittent claudication, lower extremity edema, unexplained weight gain, cough, hemoptysis or wheezing.  Still carefully avoiding exposure to coronavirus.  Interrogation of her pacemaker today shows normal device function. The estimated generator longevity is 6.5 months. There is only 43% atrial pacing and <% ventricular pacing.  She has infrequent episodes of paroxysmal atrial tachycardia, usually under 10 seconds (one episode 3'42" in October).   Past Medical History:  Diagnosis Date  . Anxiety   . Diverticulosis of colon   . Dysrhythmia    AFib  . History of kidney stones   . Osteoarthritis   . Osteoporosis   . Paroxysmal atrial fibrillation (HCC)    with SVT response  . Presence of permanent cardiac pacemaker   . Sinus node dysfunction (HCC)   . Tachy-brady syndrome [facility])     Past Surgical History:  Procedure Laterality Date  . ABDOMINAL HYSTERECTOMY    . APPENDECTOMY    . BREAST BIOPSY     right, benign  . CERVICAL SPINE SURGERY     two  . CHOLECYSTECTOMY    . COLONOSCOPY  2007   sigmoid diverticula, few ulcerations of terminal ileum, biopsies of TI unremarkable, random colon bx neg for microscopic colitis.  . COLONOSCOPY  06/04/11   Dr. Gala Romney- pegmentation of the rectum, pancolonic diverticula, solitary ulcer in the  mouth of the ileocecal valve, distal 15 cm of the terminal ileum appeared normal. Random colon biopsies were benign. Ileocecal valve ulcer or was benign without any supportive evidence of inflammatory bowel disease.  Marland Kitchen COLONOSCOPY WITH PROPOFOL N/A 04/26/2018   Dr. Gala Romney: diverticulosis, no future screening colonoscopies planned due to age.  . ESOPHAGOGASTRODUODENOSCOPY  06/04/11   Dr. Vivi Ferns esophagus, deffuse petechial and gastric submucosal petechiae- benign mucousa on bx. Small bowel biopsy benign. No H. pylori.  . ESOPHAGOGASTRODUODENOSCOPY (EGD) WITH PROPOFOL N/A 07/28/2019   Dr. Emerson Monte: Small hiatal hernia, dyspepsia likely due to Fosamax  . INSERT / REPLACE / REMOVE PACEMAKER    . left hip surgery     in [place], around 2018  . PERMANENT PACEMAKER INSERTION  07/2011  . s/p hysterectomy    . sb capsule study  2009   Two single small nonbleeding AVMs in the  proximal small bowel, single lymphangiectasia in mid small bowel.  . vocal cord polypectomy      Current Medications: Outpatient Medications Prior to Visit  Medication Sig Dispense Refill  . b complex vitamins tablet Take 1 tablet by mouth daily.    Marland Kitchen CALCIUM-VITAMIN D PO Take 1 tablet by mouth 2 (two) times daily.    . diclofenac sodium (VOLTAREN) 1 % GEL Apply 1 application topically daily as needed (pain).     . ferrous sulfate 325 (65 FE) MG tablet Take 325 mg by mouth daily as needed (fatigue).     . hydrOXYzine (ATARAX/VISTARIL) 25 MG tablet Take 25 mg by mouth every 8 (  eight) hours as needed for anxiety.     . Multiple Vitamin (MULTIVITAMIN WITH MINERALS) TABS tablet Take 1 tablet by mouth daily.    Marland Kitchen oxyCODONE (ROXICODONE) 15 MG immediate release tablet Take 15 mg by mouth every 6 (six) hours as needed (for pain).     . pantoprazole (PROTONIX) 40 MG tablet Take 1 tablet (40 mg total) by mouth daily before breakfast. 30 tablet 11  . vitamin C (ASCORBIC ACID) 500 MG tablet Take 500 mg by mouth daily.    Marland Kitchen zolpidem  (AMBIEN) 10 MG tablet Take 10 mg by mouth at bedtime.     No facility-administered medications prior to visit.     Allergies:   Ampicillin and Iodine   Social History   Socioeconomic History  . Marital status: Divorced    Spouse name: Not on file  . Number of children: Not on file  . Years of education: Not on file  . Highest education level: Not on file  Occupational History  . Occupation: [employer]  Tobacco Use  . Smoking status: Former Smoker    Types: Cigarettes    Quit date: 12/15/1973    Years since quitting: 47.1  . Smokeless tobacco: Never Used  . Tobacco comment: occas  Vaping Use  . Vaping Use: Never used  Substance and Sexual Activity  . Alcohol use: No  . Drug use: No  . Sexual activity: Not on file  Other Topics Concern  . Not on file  Social History Narrative   Has one son   Social Determinants of Health   Financial Resource Strain: Not on file  Food Insecurity: Not on file  Transportation Needs: Not on file  Physical Activity: Not on file  Stress: Not on file  Social Connections: Not on file     Family History:  The patient's family history includes COPD (age of onset: 60) in her mother; Diabetes in her brother; GI problems in her sister; Heart attack (age of onset: 15) in her father; Hypertension in her brother; Stroke (age of onset: 64) in her father.   ROS:   Please see the history of present illness.    ROS All other systems are reviewed and are negative.   PHYSICAL EXAM:   VS:  BP 127/64   Pulse 60   Ht 5' 2.5" (1.588 m)   Wt 137 lb 6.4 oz (62.3 kg)   SpO2 98%   BMI 24.73 kg/m      General: Alert, oriented x3, no distress, healthy L subclavian PM site Head: no evidence of trauma, PERRL, EOMI, no exophtalmos or lid lag, no myxedema, no xanthelasma; normal ears, nose and oropharynx Neck: normal jugular venous pulsations and no hepatojugular reflux; brisk carotid pulses without delay and no carotid bruits Chest: clear to auscultation, no  signs of consolidation by percussion or palpation, normal fremitus, symmetrical and full respiratory excursions Cardiovascular: normal position and quality of the apical impulse, regular rhythm, normal first and second heart sounds, no murmurs, rubs or gallops Abdomen: no tenderness or distention, no masses by palpation, no abnormal pulsatility or arterial bruits, normal bowel sounds, no hepatosplenomegaly Extremities: no clubbing, cyanosis or edema; 2+ radial, ulnar and brachial pulses bilaterally; 2+ right femoral, posterior tibial and dorsalis pedis pulses; 2+ left femoral, posterior tibial and dorsalis pedis pulses; no subclavian or femoral bruits Neurological: grossly nonfocal Psych: Normal mood and affect    Wt Readings from Last 3 Encounters:  01/14/21 137 lb 6.4 oz (62.3 kg)  11/28/19 147  lb (66.7 kg)  09/28/19 144 lb (65.3 kg)      Studies/Labs Reviewed:   EKG:  EKG is ordered today.  It shows A paced, V sensed rhythm, otw normal  Recent Labs: No results found for requested labs within last 8760 hours.  09/13/2019 potassium 3.7, creatinine 0.8, hemoglobin A1c 7% Lipid Panel    Component Value Date/Time   CHOL 156 07/17/2011 0605   TRIG 107 07/17/2011 0605   HDL 52 07/17/2011 0605   CHOLHDL 3.0 07/17/2011 0605   VLDL 21 07/17/2011 0605   LDLCALC 83 07/17/2011 0605   09/13/2019 total cholesterol 130, HDL 53, LDL 54, triglycerides 255 09/21/2020 Chol 198, HDL 50, TG 121  ASSESSMENT:    1. SSS (sick sinus syndrome) ([place])   2. PAT (paroxysmal atrial tachycardia) ([place])   3. Pacemaker   4. Hypertriglyceridemia      PLAN:  In order of problems listed above:  1. SSS: appropriate heart rate distribution for activity level. 2. PAT: brief, infrequent and minimally symptomatic. No specific therapy. 3. PPM: Normal device function, continue remote downloads as approaching ERI. We discussed device behavior at [facility], generator changeout procedure, including possible  complications, etc. 4. HLP: All parameters acceptable.   Medication Adjustments/Labs and Tests Ordered: Current medicines are reviewed at length with the patient today.  Concerns regarding medicines are outlined above.  Medication changes, Labs and Tests ordered today are listed in the Patient Instructions below. Patient Instructions  Medication Instructions:  No changes *If you need a refill on your cardiac medications before your next appointment, please call your pharmacy*   Lab Work: None ordered If you have labs (blood work) drawn today and your tests are completely normal, you will receive your results only by: Marland Kitchen MyChart Message (if you have MyChart) OR . A paper copy in the mail If you have any lab test that is abnormal or we need to change your treatment, we will call you to review the results.   Testing/Procedures: None ordered   Follow-Up: At [facility], you and your health needs are our priority.  As part of our continuing mission to provide you with exceptional heart care, we have created designated Provider Care Teams.  These Care Teams include your primary Cardiologist (physician) and Advanced Practice Providers (APPs -  Physician Assistants and Nurse Practitioners) who all work together to provide you with the care you need, when you need it.  We recommend signing up for the patient portal called "MyChart".  Sign up information is provided on this After Visit Summary.  MyChart is used to connect with patients for Virtual Visits (Telemedicine).  Patients are able to view lab/test results, encounter notes, upcoming appointments, etc.  Non-urgent messages can be sent to your provider as well.   To learn more about what you can do with MyChart, go to [email or web address].    Your next appointment:   7 month(s)  The format for your next appointment:   In Person  Provider:   Sanda Klein, MD   [facility]    Signed, Sanda Klein, MD  01/14/2021 [address], [place],   [address] Phone: [phone]; Fax: [phone] ,mg

## 2021-01-14 ENCOUNTER — Encounter: Payer: Self-pay | Admitting: Cardiovascular Disease

## 2021-01-14 ENCOUNTER — Ambulatory Visit (INDEPENDENT_AMBULATORY_CARE_PROVIDER_SITE_OTHER): Payer: Medicare Other | Admitting: Cardiovascular Disease

## 2021-01-14 ENCOUNTER — Other Ambulatory Visit: Payer: Self-pay

## 2021-01-14 VITALS — BP 127/64 | HR 60 | Ht 62.5 in | Wt 137.4 lb

## 2021-01-14 DIAGNOSIS — I471 Supraventricular tachycardia: Secondary | ICD-10-CM

## 2021-01-14 DIAGNOSIS — E781 Pure hyperglyceridemia: Secondary | ICD-10-CM | POA: Diagnosis not present

## 2021-01-14 DIAGNOSIS — Z950 Presence of cardiac pacemaker: Secondary | ICD-10-CM

## 2021-01-14 DIAGNOSIS — I495 Sick sinus syndrome: Secondary | ICD-10-CM | POA: Diagnosis not present

## 2021-01-14 NOTE — Patient Instructions (Signed)
Medication Instructions:  No changes *If you need a refill on your cardiac medications before your next appointment, please call your pharmacy*   Lab Work: None ordered If you have labs (blood work) drawn today and your tests are completely normal, you will receive your results only by: Marland Kitchen MyChart Message (if you have MyChart) OR . A paper copy in the mail If you have any lab test that is abnormal or we need to change your treatment, we will call you to review the results.   Testing/Procedures: None ordered   Follow-Up: At [facility], you and your health needs are our priority.  As part of our continuing mission to provide you with exceptional heart care, we have created designated Provider Care Teams.  These Care Teams include your primary Cardiologist (physician) and Advanced Practice Providers (APPs -  Physician Assistants and Nurse Practitioners) who all work together to provide you with the care you need, when you need it.  We recommend signing up for the patient portal called "MyChart".  Sign up information is provided on this After Visit Summary.  MyChart is used to connect with patients for Virtual Visits (Telemedicine).  Patients are able to view lab/test results, encounter notes, upcoming appointments, etc.  Non-urgent messages can be sent to your provider as well.   To learn more about what you can do with MyChart, go to [email or web address].    Your next appointment:   7 month(s)  The format for your next appointment:   In Person  Provider:   Sanda Klein, MD   [facility]

## 2021-01-24 DIAGNOSIS — E063 Autoimmune thyroiditis: Secondary | ICD-10-CM | POA: Diagnosis not present

## 2021-01-24 DIAGNOSIS — G894 Chronic pain syndrome: Secondary | ICD-10-CM | POA: Diagnosis not present

## 2021-01-24 DIAGNOSIS — R7309 Other abnormal glucose: Secondary | ICD-10-CM | POA: Diagnosis not present

## 2021-02-11 ENCOUNTER — Ambulatory Visit (INDEPENDENT_AMBULATORY_CARE_PROVIDER_SITE_OTHER): Payer: PPO

## 2021-02-11 DIAGNOSIS — I495 Sick sinus syndrome: Secondary | ICD-10-CM

## 2021-02-13 LAB — CUP PACEART REMOTE DEVICE CHECK
Battery Remaining Longevity: 6 mo
Battery Remaining Percentage: 5 %
Battery Voltage: 2.66 V
Brady Statistic AP VP Percent: 1 %
Brady Statistic AP VS Percent: 65 %
Brady Statistic AS VP Percent: 1 %
Brady Statistic AS VS Percent: 35 %
Brady Statistic RA Percent Paced: 65 %
Brady Statistic RV Percent Paced: 1 %
Date Time Interrogation Session: 20220228090747
Implantable Lead Implant Date: 20120802
Implantable Lead Implant Date: 20120802
Implantable Lead Location: 753859
Implantable Lead Location: 753860
Implantable Pulse Generator Implant Date: 20120802
Lead Channel Impedance Value: 450 Ohm
Lead Channel Impedance Value: 480 Ohm
Lead Channel Pacing Threshold Amplitude: 0.5 V
Lead Channel Pacing Threshold Amplitude: 1.125 V
Lead Channel Pacing Threshold Pulse Width: 0.5 ms
Lead Channel Pacing Threshold Pulse Width: 0.5 ms
Lead Channel Sensing Intrinsic Amplitude: 2.4 mV
Lead Channel Sensing Intrinsic Amplitude: 4 mV
Lead Channel Setting Pacing Amplitude: 1.375
Lead Channel Setting Pacing Amplitude: 1.5 V
Lead Channel Setting Pacing Pulse Width: 0.5 ms
Lead Channel Setting Sensing Sensitivity: 1 mV
Pulse Gen Model: 2210
Pulse Gen Serial Number: 7250678

## 2021-02-15 DIAGNOSIS — E063 Autoimmune thyroiditis: Secondary | ICD-10-CM | POA: Diagnosis not present

## 2021-02-15 DIAGNOSIS — I4891 Unspecified atrial fibrillation: Secondary | ICD-10-CM | POA: Diagnosis not present

## 2021-02-15 DIAGNOSIS — G894 Chronic pain syndrome: Secondary | ICD-10-CM | POA: Diagnosis not present

## 2021-02-15 DIAGNOSIS — K219 Gastro-esophageal reflux disease without esophagitis: Secondary | ICD-10-CM | POA: Diagnosis not present

## 2021-02-18 NOTE — Progress Notes (Signed)
Remote pacemaker transmission.   

## 2021-02-18 NOTE — Addendum Note (Signed)
Addended by: Douglass Rivers D on: 02/18/2021 03:34 PM   Modules accepted: Level of Service

## 2021-02-27 ENCOUNTER — Ambulatory Visit: Payer: Medicare Other | Admitting: Orthopedic Surgery

## 2021-02-27 ENCOUNTER — Encounter: Payer: Self-pay | Admitting: Orthopedic Surgery

## 2021-02-27 ENCOUNTER — Other Ambulatory Visit: Payer: Self-pay

## 2021-02-27 ENCOUNTER — Ambulatory Visit: Payer: Medicare Other

## 2021-02-27 VITALS — BP 152/78 | HR 69 | Ht 62.0 in | Wt 140.0 lb

## 2021-02-27 DIAGNOSIS — M79604 Pain in right leg: Secondary | ICD-10-CM | POA: Diagnosis not present

## 2021-02-27 DIAGNOSIS — M25551 Pain in right hip: Secondary | ICD-10-CM | POA: Diagnosis not present

## 2021-02-27 DIAGNOSIS — M5410 Radiculopathy, site unspecified: Secondary | ICD-10-CM

## 2021-02-27 MED ORDER — GABAPENTIN 300 MG PO CAPS: 300.0000 mg | ORAL_CAPSULE | Freq: Three times a day (TID) | ORAL | 5 refills | Status: DC

## 2021-02-27 MED ORDER — PREDNISONE 10 MG (48) PO TBPK: ORAL_TABLET | Freq: Every day | ORAL | 0 refills | Status: DC

## 2021-02-27 NOTE — Progress Notes (Signed)
Chief Complaint  Patient presents with  . Hip Pain    Rt side hip and leg pain   . Leg Pain    History this is a 70 year old female had a left intertrochanteric fracture treated with a short gamma nail which healed without event.  She also has a lumbar fusion L4-S1 by Dr. Carloyn Manner comes in with 4-week history of right leg pain unrelieved by gabapentin 100 mg 3 times daily and IM injection cortisone  She has a dull aching pain starting in her buttocks and radiating down to her right foot to deep dull aching pain quite severe  System review some leg swelling and edema no fever no chills no chest pain no shortness of breath  Past Medical History:  Diagnosis Date  . Anxiety   . Diverticulosis of colon   . Dysrhythmia    AFib  . History of kidney stones   . Osteoarthritis   . Osteoporosis   . Paroxysmal atrial fibrillation (HCC)    with SVT response  . Presence of permanent cardiac pacemaker   . Sinus node dysfunction (HCC)   . Tachy-brady syndrome [facility])     Past Surgical History:  Procedure Laterality Date  . ABDOMINAL HYSTERECTOMY    . APPENDECTOMY    . BREAST BIOPSY     right, benign  . CERVICAL SPINE SURGERY     two  . CHOLECYSTECTOMY    . COLONOSCOPY  2007   sigmoid diverticula, few ulcerations of terminal ileum, biopsies of TI unremarkable, random colon bx neg for microscopic colitis.  . COLONOSCOPY  06/04/11   Dr. Gala Romney- pegmentation of the rectum, pancolonic diverticula, solitary ulcer in the mouth of the ileocecal valve, distal 15 cm of the terminal ileum appeared normal. Random colon biopsies were benign. Ileocecal valve ulcer or was benign without any supportive evidence of inflammatory bowel disease.  [facility] COLONOSCOPY WITH PROPOFOL N/A 04/26/2018   Dr. Gala Romney: diverticulosis, no future screening colonoscopies planned due to age.  . ESOPHAGOGASTRODUODENOSCOPY  06/04/11   Dr. Vivi Ferns esophagus, deffuse petechial and gastric submucosal petechiae- benign mucousa on bx. Small  bowel biopsy benign. No H. pylori.  . ESOPHAGOGASTRODUODENOSCOPY (EGD) WITH PROPOFOL N/A 07/28/2019   Dr. Emerson Monte: Small hiatal hernia, dyspepsia likely due to Fosamax  . INSERT / REPLACE / REMOVE PACEMAKER    . left hip surgery     in [place], around 2018  . PERMANENT PACEMAKER INSERTION  07/2011  . s/p hysterectomy    . sb capsule study  2009   Two single small nonbleeding AVMs in the  proximal small bowel, single lymphangiectasia in mid small bowel.  . vocal cord polypectomy      BP (!) 152/78   Pulse 69   Ht 5\' 2"  (1.575 m)   Wt 140 lb (63.5 kg)   BMI 25.61 kg/m   Physical Exam Constitutional:      General: She is not in acute distress.    Appearance: She is well-developed.     Comments: Well developed, well nourished Normal grooming and hygiene     Cardiovascular:     Comments: No peripheral edema Musculoskeletal:     Right lower leg: Edema present.     Left lower leg: Edema present.     Comments: She has tenderness in her lower back negative right straight leg raise  Normal range of motion right hip Normal strength right leg compared to left No sensory deficits in either leg  Skin:    General: Skin is warm  and dry.     Capillary Refill: Capillary refill takes less than 2 seconds.  Neurological:     General: No focal deficit present.     Mental Status: She is alert and oriented to person, place, and time.     Sensory: No sensory deficit.     Coordination: Coordination normal.     Gait: Gait normal.     Deep Tendon Reflexes: Reflexes are normal and symmetric.  Psychiatric:        Mood and Affect: Mood normal.        Behavior: Behavior normal.        Thought Content: Thought content normal.        Judgment: Judgment normal.     Comments: Affect normal     Lumbar spine film shows an L4-S1 fusion which is negative for any acute or obvious changes  Hip x-rays show a normal stable left hip gamma nail with a healed inner troches fracture  Right hip shows a  cyst in the acetabulum but no joint space narrowing  Encounter Diagnoses  Name Primary?  . Pain in right hip   . Pain in right leg   . Radicular leg pain Yes    Meds ordered this encounter  Medications  . gabapentin (NEURONTIN) 300 MG capsule    Sig: Take 1 capsule (300 mg total) by mouth 3 (three) times daily.    Dispense:  90 capsule    Refill:  5  . predniSONE (STERAPRED UNI-PAK 48 TAB) 10 MG (48) TBPK tablet    Sig: Take by mouth daily. 12 days ds as directed    Dispense:  48 tablet    Refill:  0    Follow-up in 6 weeks

## 2021-03-11 ENCOUNTER — Ambulatory Visit (INDEPENDENT_AMBULATORY_CARE_PROVIDER_SITE_OTHER): Payer: Medicare Other

## 2021-03-11 DIAGNOSIS — I495 Sick sinus syndrome: Secondary | ICD-10-CM | POA: Diagnosis not present

## 2021-03-11 LAB — CUP PACEART REMOTE DEVICE CHECK
Battery Remaining Longevity: 4 mo
Battery Remaining Percentage: 3 %
Battery Voltage: 2.65 V
Brady Statistic AP VP Percent: 1 %
Brady Statistic AP VS Percent: 63 %
Brady Statistic AS VP Percent: 1 %
Brady Statistic AS VS Percent: 37 %
Brady Statistic RA Percent Paced: 62 %
Brady Statistic RV Percent Paced: 1 %
Date Time Interrogation Session: 20220328020019
Implantable Lead Implant Date: 20120802
Implantable Lead Implant Date: 20120802
Implantable Lead Location: 753859
Implantable Lead Location: 753860
Implantable Pulse Generator Implant Date: 20120802
Lead Channel Impedance Value: 430 Ohm
Lead Channel Impedance Value: 480 Ohm
Lead Channel Pacing Threshold Amplitude: 0.5 V
Lead Channel Pacing Threshold Amplitude: 1.125 V
Lead Channel Pacing Threshold Pulse Width: 0.5 ms
Lead Channel Pacing Threshold Pulse Width: 0.5 ms
Lead Channel Sensing Intrinsic Amplitude: 2.4 mV
Lead Channel Sensing Intrinsic Amplitude: 4.1 mV
Lead Channel Setting Pacing Amplitude: 1.375
Lead Channel Setting Pacing Amplitude: 1.5 V
Lead Channel Setting Pacing Pulse Width: 0.5 ms
Lead Channel Setting Sensing Sensitivity: 1 mV
Pulse Gen Model: 2210
Pulse Gen Serial Number: 7250678

## 2021-03-13 DIAGNOSIS — M810 Age-related osteoporosis without current pathological fracture: Secondary | ICD-10-CM | POA: Diagnosis not present

## 2021-03-13 DIAGNOSIS — E7849 Other hyperlipidemia: Secondary | ICD-10-CM | POA: Diagnosis not present

## 2021-03-13 DIAGNOSIS — G894 Chronic pain syndrome: Secondary | ICD-10-CM | POA: Diagnosis not present

## 2021-03-25 DIAGNOSIS — G894 Chronic pain syndrome: Secondary | ICD-10-CM | POA: Diagnosis not present

## 2021-03-25 NOTE — Progress Notes (Signed)
Remote pacemaker transmission.   

## 2021-04-10 ENCOUNTER — Ambulatory Visit (INDEPENDENT_AMBULATORY_CARE_PROVIDER_SITE_OTHER): Payer: Medicare Other | Admitting: Orthopedic Surgery

## 2021-04-10 ENCOUNTER — Other Ambulatory Visit: Payer: Self-pay

## 2021-04-10 ENCOUNTER — Encounter: Payer: Self-pay | Admitting: Orthopedic Surgery

## 2021-04-10 VITALS — BP 128/58 | HR 72 | Ht 62.0 in | Wt 142.0 lb

## 2021-04-10 DIAGNOSIS — M5410 Radiculopathy, site unspecified: Secondary | ICD-10-CM

## 2021-04-10 MED ORDER — MELOXICAM 7.5 MG PO TABS: 7.5000 mg | ORAL_TABLET | Freq: Every day | ORAL | 5 refills | Status: DC

## 2021-04-10 NOTE — Patient Instructions (Addendum)
Continue the Gabapentin for your back and leg pain  ADD MELOXICAM   FEEL FREE TO SEE [place] AGAIN IF YOUR SYMPTOMS WORSEN

## 2021-04-10 NOTE — Progress Notes (Signed)
Chief Complaint  Patient presents with  . Back Pain    Better, but is still bothering her   Encounter Diagnosis  Name Primary?  . Radicular leg pain Yes   History this is a 70 year old female had a left intertrochanteric fracture treated with a short gamma nail which healed without event.  She also has a lumbar fusion L4-S1 by Dr. Carloyn Manner comes in with 4-week history of right leg pain unrelieved by gabapentin 100 mg 3 times daily and IM injection cortisone   She has a dull aching pain starting in her buttocks and radiating down to her right foot to deep dull aching pain quite severe   System review some leg swelling and edema no fever no chills no chest pain no shortness of breath  She improved with prednisone and 300 mg of gabapentin 3 times a day although it took her a while to get used to taking the gabapentin  She is now finished the prednisone  She still has some back pain and intermittent leg pain depending on the day   She does take Protonix 40 mg daily think it would be worth trying some anti-inflammatory medication as well since the prednisone gave such a good response  Recommend Meds ordered this encounter  Medications  . meloxicam (MOBIC) 7.5 MG tablet    Sig: Take 1 tablet (7.5 mg total) by mouth daily.    Dispense:  30 tablet    Refill:  5   And continuation of gabapentin 300 mg 3 times a day  See [place] on an as-needed basis   Current Outpatient Medications:  .  ALPRAZolam (XANAX) 0.5 MG tablet, Take 0.5 mg by mouth 3 (three) times daily as needed., Disp: , Rfl:  .  b complex vitamins tablet, Take 1 tablet by mouth daily., Disp: , Rfl:  .  CALCIUM-VITAMIN D PO, Take 1 tablet by mouth 2 (two) times daily., Disp: , Rfl:  .  diclofenac sodium (VOLTAREN) 1 % GEL, Apply 1 application topically daily as needed (pain)., Disp: , Rfl:  .  ferrous sulfate 325 (65 FE) MG tablet, Take 325 mg by mouth daily as needed (fatigue)., Disp: , Rfl:  .  gabapentin (NEURONTIN) 300 MG  capsule, Take 1 capsule (300 mg total) by mouth 3 (three) times daily., Disp: 90 capsule, Rfl: 5 .  hydrOXYzine (ATARAX/VISTARIL) 25 MG tablet, Take 25 mg by mouth every 8 (eight) hours as needed for anxiety., Disp: , Rfl:  .  mirtazapine (REMERON) 15 MG tablet, Take by mouth., Disp: , Rfl:  .  Multiple Vitamin (MULTIVITAMIN WITH MINERALS) TABS tablet, Take 1 tablet by mouth daily., Disp: , Rfl:  .  Omega-3 Fatty Acids (FISH OIL) 1000 MG CAPS, Take by mouth., Disp: , Rfl:  .  oxyCODONE (ROXICODONE) 15 MG immediate release tablet, Take 15 mg by mouth every 6 (six) hours as needed (for pain). , Disp: , Rfl:  .  pantoprazole (PROTONIX) 40 MG tablet, Take 1 tablet (40 mg total) by mouth daily before breakfast., Disp: 30 tablet, Rfl: 11 .  predniSONE (STERAPRED UNI-PAK 48 TAB) 10 MG (48) TBPK tablet, Take by mouth daily. 12 days ds as directed, Disp: 48 tablet, Rfl: 0 .  vitamin C (ASCORBIC ACID) 500 MG tablet, Take 500 mg by mouth daily., Disp: , Rfl:  .  zolpidem (AMBIEN) 10 MG tablet, Take 10 mg by mouth at bedtime., Disp: , Rfl:

## 2021-04-12 ENCOUNTER — Ambulatory Visit (INDEPENDENT_AMBULATORY_CARE_PROVIDER_SITE_OTHER): Payer: PPO

## 2021-04-12 DIAGNOSIS — I495 Sick sinus syndrome: Secondary | ICD-10-CM

## 2021-04-12 LAB — CUP PACEART REMOTE DEVICE CHECK
Battery Remaining Longevity: 4 mo
Battery Remaining Percentage: 3 %
Battery Voltage: 2.65 V
Brady Statistic AP VP Percent: 1 %
Brady Statistic AP VS Percent: 51 %
Brady Statistic AS VP Percent: 1 %
Brady Statistic AS VS Percent: 49 %
Brady Statistic RA Percent Paced: 51 %
Brady Statistic RV Percent Paced: 1 %
Date Time Interrogation Session: 20220429022746
Implantable Lead Implant Date: 20120802
Implantable Lead Implant Date: 20120802
Implantable Lead Location: 753859
Implantable Lead Location: 753860
Implantable Pulse Generator Implant Date: 20120802
Lead Channel Impedance Value: 440 Ohm
Lead Channel Impedance Value: 480 Ohm
Lead Channel Pacing Threshold Amplitude: 0.5 V
Lead Channel Pacing Threshold Amplitude: 1.125 V
Lead Channel Pacing Threshold Pulse Width: 0.5 ms
Lead Channel Pacing Threshold Pulse Width: 0.5 ms
Lead Channel Sensing Intrinsic Amplitude: 2.2 mV
Lead Channel Sensing Intrinsic Amplitude: 3.6 mV
Lead Channel Setting Pacing Amplitude: 1.375
Lead Channel Setting Pacing Amplitude: 1.5 V
Lead Channel Setting Pacing Pulse Width: 0.5 ms
Lead Channel Setting Sensing Sensitivity: 1 mV
Pulse Gen Model: 2210
Pulse Gen Serial Number: 7250678

## 2021-04-19 DIAGNOSIS — I4891 Unspecified atrial fibrillation: Secondary | ICD-10-CM | POA: Diagnosis not present

## 2021-04-19 DIAGNOSIS — G894 Chronic pain syndrome: Secondary | ICD-10-CM | POA: Diagnosis not present

## 2021-04-19 DIAGNOSIS — M1991 Primary osteoarthritis, unspecified site: Secondary | ICD-10-CM | POA: Diagnosis not present

## 2021-05-02 NOTE — Progress Notes (Signed)
Remote pacemaker transmission.   

## 2021-05-14 ENCOUNTER — Ambulatory Visit (INDEPENDENT_AMBULATORY_CARE_PROVIDER_SITE_OTHER): Payer: PPO

## 2021-05-14 DIAGNOSIS — I495 Sick sinus syndrome: Secondary | ICD-10-CM

## 2021-05-14 DIAGNOSIS — M810 Age-related osteoporosis without current pathological fracture: Secondary | ICD-10-CM | POA: Diagnosis not present

## 2021-05-14 DIAGNOSIS — E7849 Other hyperlipidemia: Secondary | ICD-10-CM | POA: Diagnosis not present

## 2021-05-14 DIAGNOSIS — G894 Chronic pain syndrome: Secondary | ICD-10-CM | POA: Diagnosis not present

## 2021-05-15 LAB — CUP PACEART REMOTE DEVICE CHECK
Battery Remaining Longevity: 1 mo
Battery Remaining Percentage: 0.5 %
Battery Voltage: 2.62 V
Brady Statistic AP VP Percent: 1 %
Brady Statistic AP VS Percent: 46 %
Brady Statistic AS VP Percent: 1 %
Brady Statistic AS VS Percent: 53 %
Brady Statistic RA Percent Paced: 46 %
Brady Statistic RV Percent Paced: 1 %
Date Time Interrogation Session: 20220531204919
Implantable Lead Implant Date: 20120802
Implantable Lead Implant Date: 20120802
Implantable Lead Location: 753859
Implantable Lead Location: 753860
Implantable Pulse Generator Implant Date: 20120802
Lead Channel Impedance Value: 410 Ohm
Lead Channel Impedance Value: 450 Ohm
Lead Channel Pacing Threshold Amplitude: 0.5 V
Lead Channel Pacing Threshold Amplitude: 1.25 V
Lead Channel Pacing Threshold Pulse Width: 0.5 ms
Lead Channel Pacing Threshold Pulse Width: 0.5 ms
Lead Channel Sensing Intrinsic Amplitude: 1.9 mV
Lead Channel Sensing Intrinsic Amplitude: 2.2 mV
Lead Channel Setting Pacing Amplitude: 1.5 V
Lead Channel Setting Pacing Amplitude: 1.5 V
Lead Channel Setting Pacing Pulse Width: 0.5 ms
Lead Channel Setting Sensing Sensitivity: 1 mV
Pulse Gen Model: 2210
Pulse Gen Serial Number: 7250678

## 2021-05-16 ENCOUNTER — Other Ambulatory Visit (HOSPITAL_COMMUNITY): Payer: Self-pay | Admitting: Internal Medicine

## 2021-05-16 DIAGNOSIS — Z1231 Encounter for screening mammogram for malignant neoplasm of breast: Secondary | ICD-10-CM

## 2021-05-17 ENCOUNTER — Ambulatory Visit (HOSPITAL_COMMUNITY)
Admission: RE | Admit: 2021-05-17 | Discharge: 2021-05-17 | Disposition: A | Discharge status: Home / Self Care | Payer: Medicare Other | Source: Ambulatory Visit | Attending: Internal Medicine | Admitting: Internal Medicine

## 2021-05-17 ENCOUNTER — Other Ambulatory Visit: Payer: Self-pay

## 2021-05-17 DIAGNOSIS — Z1231 Encounter for screening mammogram for malignant neoplasm of breast: Secondary | ICD-10-CM | POA: Insufficient documentation

## 2021-05-20 DIAGNOSIS — G894 Chronic pain syndrome: Secondary | ICD-10-CM | POA: Diagnosis not present

## 2021-05-20 DIAGNOSIS — E063 Autoimmune thyroiditis: Secondary | ICD-10-CM | POA: Diagnosis not present

## 2021-05-20 DIAGNOSIS — E559 Vitamin D deficiency, unspecified: Secondary | ICD-10-CM | POA: Diagnosis not present

## 2021-05-20 DIAGNOSIS — I4891 Unspecified atrial fibrillation: Secondary | ICD-10-CM | POA: Diagnosis not present

## 2021-06-06 NOTE — Addendum Note (Signed)
Addended by: Douglass Rivers D on: 06/06/2021 11:14 AM   Modules accepted: Level of Service

## 2021-06-06 NOTE — Progress Notes (Signed)
Remote pacemaker transmission.   

## 2021-06-10 ENCOUNTER — Ambulatory Visit (INDEPENDENT_AMBULATORY_CARE_PROVIDER_SITE_OTHER): Payer: Medicare Other

## 2021-06-10 DIAGNOSIS — I495 Sick sinus syndrome: Secondary | ICD-10-CM | POA: Diagnosis not present

## 2021-06-11 LAB — CUP PACEART REMOTE DEVICE CHECK
Battery Remaining Longevity: 1 mo
Battery Remaining Longevity: 1 mo
Battery Remaining Percentage: 0.5 %
Battery Remaining Percentage: 0.5 %
Battery Voltage: 2.6 V
Battery Voltage: 2.6 V
Brady Statistic AP VP Percent: 1 %
Brady Statistic AP VP Percent: 1 %
Brady Statistic AP VS Percent: 44 %
Brady Statistic AP VS Percent: 44 %
Brady Statistic AS VP Percent: 1 %
Brady Statistic AS VP Percent: 1 %
Brady Statistic AS VS Percent: 55 %
Brady Statistic AS VS Percent: 56 %
Brady Statistic RA Percent Paced: 44 %
Brady Statistic RA Percent Paced: 44 %
Brady Statistic RV Percent Paced: 1 %
Brady Statistic RV Percent Paced: 1 %
Date Time Interrogation Session: 20220627020444
Date Time Interrogation Session: 20220627194327
Implantable Lead Implant Date: 20120802
Implantable Lead Implant Date: 20120802
Implantable Lead Implant Date: 20120802
Implantable Lead Implant Date: 20120802
Implantable Lead Location: 753859
Implantable Lead Location: 753860
Implantable Lead Location: 753859
Implantable Lead Location: 753860
Implantable Pulse Generator Implant Date: 20120802
Implantable Pulse Generator Implant Date: 20120802
Lead Channel Impedance Value: 430 Ohm
Lead Channel Impedance Value: 480 Ohm
Lead Channel Impedance Value: 430 Ohm
Lead Channel Impedance Value: 480 Ohm
Lead Channel Pacing Threshold Amplitude: 0.5 V
Lead Channel Pacing Threshold Amplitude: 1 V
Lead Channel Pacing Threshold Amplitude: 0.5 V
Lead Channel Pacing Threshold Amplitude: 1 V
Lead Channel Pacing Threshold Pulse Width: 0.5 ms
Lead Channel Pacing Threshold Pulse Width: 0.5 ms
Lead Channel Pacing Threshold Pulse Width: 0.5 ms
Lead Channel Pacing Threshold Pulse Width: 0.5 ms
Lead Channel Sensing Intrinsic Amplitude: 1.9 mV
Lead Channel Sensing Intrinsic Amplitude: 2.8 mV
Lead Channel Sensing Intrinsic Amplitude: 1.9 mV
Lead Channel Sensing Intrinsic Amplitude: 2.8 mV
Lead Channel Setting Pacing Amplitude: 1.25 V
Lead Channel Setting Pacing Amplitude: 1.5 V
Lead Channel Setting Pacing Amplitude: 1.25 V
Lead Channel Setting Pacing Amplitude: 1.5 V
Lead Channel Setting Pacing Pulse Width: 0.5 ms
Lead Channel Setting Pacing Pulse Width: 0.5 ms
Lead Channel Setting Sensing Sensitivity: 1 mV
Lead Channel Setting Sensing Sensitivity: 1 mV
Pulse Gen Model: 2210
Pulse Gen Model: 2210
Pulse Gen Serial Number: 7250678
Pulse Gen Serial Number: 7250678

## 2021-06-18 DIAGNOSIS — Z789 Other specified health status: Secondary | ICD-10-CM | POA: Diagnosis not present

## 2021-06-18 DIAGNOSIS — G4700 Insomnia, unspecified: Secondary | ICD-10-CM | POA: Diagnosis not present

## 2021-06-18 DIAGNOSIS — G894 Chronic pain syndrome: Secondary | ICD-10-CM | POA: Diagnosis not present

## 2021-07-01 NOTE — Progress Notes (Signed)
Remote pacemaker transmission.   

## 2021-07-12 ENCOUNTER — Ambulatory Visit (INDEPENDENT_AMBULATORY_CARE_PROVIDER_SITE_OTHER): Payer: PPO

## 2021-07-12 DIAGNOSIS — I495 Sick sinus syndrome: Secondary | ICD-10-CM

## 2021-07-13 LAB — CUP PACEART REMOTE DEVICE CHECK
Battery Remaining Longevity: 1 mo
Battery Remaining Percentage: 0.5 %
Battery Voltage: 2.59 V
Brady Statistic AP VP Percent: 1 %
Brady Statistic AP VS Percent: 42 %
Brady Statistic AS VP Percent: 1 %
Brady Statistic AS VS Percent: 57 %
Brady Statistic RA Percent Paced: 42 %
Brady Statistic RV Percent Paced: 1 %
Date Time Interrogation Session: 20220728021505
Implantable Lead Implant Date: 20120802
Implantable Lead Implant Date: 20120802
Implantable Lead Location: 753859
Implantable Lead Location: 753860
Implantable Pulse Generator Implant Date: 20120802
Lead Channel Impedance Value: 460 Ohm
Lead Channel Impedance Value: 530 Ohm
Lead Channel Pacing Threshold Amplitude: 0.5 V
Lead Channel Pacing Threshold Amplitude: 1.125 V
Lead Channel Pacing Threshold Pulse Width: 0.5 ms
Lead Channel Pacing Threshold Pulse Width: 0.5 ms
Lead Channel Sensing Intrinsic Amplitude: 3.3 mV
Lead Channel Sensing Intrinsic Amplitude: 3.7 mV
Lead Channel Setting Pacing Amplitude: 1.375
Lead Channel Setting Pacing Amplitude: 1.5 V
Lead Channel Setting Pacing Pulse Width: 0.5 ms
Lead Channel Setting Sensing Sensitivity: 1 mV
Pulse Gen Model: 2210
Pulse Gen Serial Number: 7250678

## 2021-07-22 DIAGNOSIS — I4891 Unspecified atrial fibrillation: Secondary | ICD-10-CM | POA: Diagnosis not present

## 2021-07-22 DIAGNOSIS — E063 Autoimmune thyroiditis: Secondary | ICD-10-CM | POA: Diagnosis not present

## 2021-07-22 DIAGNOSIS — M2550 Pain in unspecified joint: Secondary | ICD-10-CM | POA: Diagnosis not present

## 2021-07-22 DIAGNOSIS — G894 Chronic pain syndrome: Secondary | ICD-10-CM | POA: Diagnosis not present

## 2021-07-22 DIAGNOSIS — M1991 Primary osteoarthritis, unspecified site: Secondary | ICD-10-CM | POA: Diagnosis not present

## 2021-07-29 ENCOUNTER — Ambulatory Visit (INDEPENDENT_AMBULATORY_CARE_PROVIDER_SITE_OTHER): Payer: Medicare Other | Admitting: Cardiovascular Disease

## 2021-07-29 ENCOUNTER — Encounter: Payer: Self-pay | Admitting: Cardiovascular Disease

## 2021-07-29 ENCOUNTER — Other Ambulatory Visit: Payer: Self-pay

## 2021-07-29 VITALS — BP 134/68 | HR 55 | Ht 62.0 in | Wt 144.7 lb

## 2021-07-29 DIAGNOSIS — E781 Pure hyperglyceridemia: Secondary | ICD-10-CM | POA: Diagnosis not present

## 2021-07-29 DIAGNOSIS — Z45010 Encounter for checking and testing of cardiac pacemaker pulse generator [battery]: Secondary | ICD-10-CM | POA: Diagnosis not present

## 2021-07-29 DIAGNOSIS — I495 Sick sinus syndrome: Secondary | ICD-10-CM

## 2021-07-29 DIAGNOSIS — Z01818 Encounter for other preprocedural examination: Secondary | ICD-10-CM

## 2021-07-29 DIAGNOSIS — I471 Supraventricular tachycardia: Secondary | ICD-10-CM | POA: Diagnosis not present

## 2021-07-29 LAB — BASIC METABOLIC PANEL
BUN/Creatinine Ratio: 9 — ABNORMAL LOW (ref 12–28)
BUN: 7 mg/dL — ABNORMAL LOW (ref 8–27)
CO2: 27 mmol/L (ref 20–29)
Calcium: 9.4 mg/dL (ref 8.7–10.3)
Chloride: 99 mmol/L (ref 96–106)
Creatinine, Ser: 0.76 mg/dL (ref 0.57–1.00)
Glucose: 87 mg/dL (ref 65–99)
Potassium: 5.1 mmol/L (ref 3.5–5.2)
Sodium: 140 mmol/L (ref 134–144)
eGFR: 84 mL/min/{1.73_m2} (ref >59)

## 2021-07-29 LAB — CBC
Hematocrit: 39.6 % (ref 34.0–46.6)
Hemoglobin: 13.6 g/dL (ref 11.1–15.9)
MCH: 31.7 pg (ref 26.6–33.0)
MCHC: 34.3 g/dL (ref 31.5–35.7)
MCV: 92 fL (ref 79–97)
Platelets: 223 10*3/uL (ref 150–450)
RBC: 4.29 x10E6/uL (ref 3.77–5.28)
RDW: 12.2 % (ref 11.7–15.4)
WBC: 7.4 10*3/uL (ref 3.4–10.8)

## 2021-07-29 LAB — PACEMAKER DEVICE OBSERVATION

## 2021-07-29 NOTE — H&P (View-Only) (Signed)
Cardiology Office Note    Date:  07/31/2021   ID:  DELAINY Thompson, DOB January 12, 1951, MRN [id number]  PCP:  Brandy School, MD  Cardiologist:   Brandy Klein, MD   Chief complaint: pacemaker ERI  History of Present Illness:  [name] is a 70 y.o. female with tachycardia-bradycardia syndrome (sinus bradycardia and paroxysmal atrial tachycardia) with a dual-chamber permanent pacemaker ([employer], implanted 2012) returning for pacemaker follow-up.   Feeling poorly for the last couple of weeks.  Has a lot of fatigue.  Her pacemaker just declared ERI yesterday, battery voltage is actually down to 2.57 V (ERI 2.60 V) in the current pacing rate is 55 bpm although the lower rate limit is set at 60 bpm.  Rate response is off.  Lead parameters are normal and there have been no significant episodes of arrhythmia.  She is not pacemaker dependent.  Underlying rhythm is sinus bradycardia in the low 50s.  She has infrequent episodes of paroxysmal atrial tachycardia, usually under 10 seconds (one episode 3'42" in October).  She continues to have occasional sustained but very brief episodes of atrial tachycardia, up to 5 minutes in duration.  These are pretty rare.  They are asymptomatic.  The patient specifically denies any chest pain at rest exertion, dyspnea at rest or with exertion, orthopnea, paroxysmal nocturnal dyspnea, syncope, palpitations, focal neurological deficits, intermittent claudication, lower extremity edema, unexplained weight gain, cough, hemoptysis or wheezing.   Past Medical History:  Diagnosis Date   Anxiety    Diverticulosis of colon    Dysrhythmia    AFib   History of kidney stones    Osteoarthritis    Osteoporosis    Paroxysmal atrial fibrillation (HCC)    with SVT response   Presence of permanent cardiac pacemaker    Sinus node dysfunction (HCC)    Tachy-brady syndrome ([place])     Past Surgical History:  Procedure Laterality Date   ABDOMINAL  HYSTERECTOMY     APPENDECTOMY     BREAST BIOPSY     right, benign   CERVICAL SPINE SURGERY     two   CHOLECYSTECTOMY     COLONOSCOPY  2007   sigmoid diverticula, few ulcerations of terminal ileum, biopsies of TI unremarkable, random colon bx neg for microscopic colitis.   COLONOSCOPY  06/04/11   Dr. Gala Romney- pegmentation of the rectum, pancolonic diverticula, solitary ulcer in the mouth of the ileocecal valve, distal 15 cm of the terminal ileum appeared normal. Random colon biopsies were benign. Ileocecal valve ulcer or was benign without any supportive evidence of inflammatory bowel disease.   COLONOSCOPY WITH PROPOFOL N/A 04/26/2018   Dr. Gala Romney: diverticulosis, no future screening colonoscopies planned due to age.   ESOPHAGOGASTRODUODENOSCOPY  06/04/11   Dr. Vivi Ferns esophagus, deffuse petechial and gastric submucosal petechiae- benign mucousa on bx. Small bowel biopsy benign. No H. pylori.   ESOPHAGOGASTRODUODENOSCOPY (EGD) WITH PROPOFOL N/A 07/28/2019   Dr. Emerson Monte: Small hiatal hernia, dyspepsia likely due to Fosamax   INSERT / REPLACE / REMOVE PACEMAKER     left hip surgery     in [place], around 2018   [place]  07/2011   s/p hysterectomy     sb capsule study  2009   Two single small nonbleeding AVMs in the  proximal small bowel, single lymphangiectasia in mid small bowel.   vocal cord polypectomy      Current Medications: Outpatient Medications Prior to Visit  Medication Sig Dispense Refill  ALPRAZolam (XANAX) 0.5 MG tablet Take 0.5 mg by mouth 3 (three) times daily as needed.     b complex vitamins tablet Take 1 tablet by mouth daily.     CALCIUM-VITAMIN D PO Take 1 tablet by mouth 2 (two) times daily.     diclofenac sodium (VOLTAREN) 1 % GEL Apply 1 application topically daily as needed (pain).     gabapentin (NEURONTIN) 300 MG capsule Take 1 capsule (300 mg total) by mouth 3 (three) times daily. 90 capsule 5   meloxicam (MOBIC) 7.5 MG tablet Take 1  tablet (7.5 mg total) by mouth daily. 30 tablet 5   mirtazapine (REMERON) 15 MG tablet Take 15 mg by mouth at bedtime. Patient takes 2 tablets at bedtime     Multiple Vitamin (MULTIVITAMIN WITH MINERALS) TABS tablet Take 1 tablet by mouth daily.     Omega-3 Fatty Acids (FISH OIL) 1000 MG CAPS Take by mouth.     oxyCODONE (ROXICODONE) 15 MG immediate release tablet Take 15 mg by mouth every 6 (six) hours as needed (for pain).      pantoprazole (PROTONIX) 40 MG tablet Take 1 tablet (40 mg total) by mouth daily before breakfast. 30 tablet 11   vitamin C (ASCORBIC ACID) 500 MG tablet Take 500 mg by mouth daily.     zolpidem (AMBIEN) 10 MG tablet Take 10 mg by mouth at bedtime.     ferrous sulfate 325 (65 FE) MG tablet Take 325 mg by mouth daily as needed (fatigue). (Patient not taking: Reported on 07/29/2021)     hydrOXYzine (ATARAX/VISTARIL) 25 MG tablet Take 25 mg by mouth every 8 (eight) hours as needed for anxiety. (Patient not taking: Reported on 07/29/2021)     No facility-administered medications prior to visit.     Allergies:   Ampicillin and Iodine   Social History   Socioeconomic History   Marital status: Divorced    Spouse name: Not on file   Number of children: Not on file   Years of education: Not on file   Highest education level: Not on file  Occupational History   Occupation: [employer]  Tobacco Use   Smoking status: Former    Types: Cigarettes    Quit date: 12/15/1973    Years since quitting: 47.6   Smokeless tobacco: Never   Tobacco comments:    occas  Vaping Use   Vaping Use: Never used  Substance and Sexual Activity   Alcohol use: No   Drug use: No   Sexual activity: Not on file  Other Topics Concern   Not on file  Social History Narrative   Has one son   Social Determinants of Health   Financial Resource Strain: Not on file  Food Insecurity: Not on file  Transportation Needs: Not on file  Physical Activity: Not on file  Stress: Not on file  Social  Connections: Not on file     Family History:  The patient's family history includes COPD (age of onset: 90) in her mother; Diabetes in her brother; GI problems in her sister; Heart attack (age of onset: 53) in her father; Hypertension in her brother; Stroke (age of onset: 41) in her father.   ROS:   Please see the history of present illness.    ROS All other systems are reviewed and are negative.   PHYSICAL EXAM:   VS:  BP 134/68 (BP Location: Left Arm, Patient Position: Sitting, Cuff Size: Normal)   Pulse (!) 55   Ht 5'  2" (1.575 m)   Wt 144 lb 11.2 oz (65.6 kg)   SpO2 98%   BMI 26.47 kg/m      General: Alert, oriented x3, no distress, healthy left subclavian pacemaker site Head: no evidence of trauma, PERRL, EOMI, no exophtalmos or lid lag, no myxedema, no xanthelasma; normal ears, nose and oropharynx Neck: normal jugular venous pulsations and no hepatojugular reflux; brisk carotid pulses without delay and no carotid bruits Chest: clear to auscultation, no signs of consolidation by percussion or palpation, normal fremitus, symmetrical and full respiratory excursions Cardiovascular: normal position and quality of the apical impulse, regular rhythm, normal first and second heart sounds, no murmurs, rubs or gallops Abdomen: no tenderness or distention, no masses by palpation, no abnormal pulsatility or arterial bruits, normal bowel sounds, no hepatosplenomegaly Extremities: no clubbing, cyanosis or edema; 2+ radial, ulnar and brachial pulses bilaterally; 2+ right femoral, posterior tibial and dorsalis pedis pulses; 2+ left femoral, posterior tibial and dorsalis pedis pulses; no subclavian or femoral bruits Neurological: grossly nonfocal Psych: Normal mood and affect    Wt Readings from Last 3 Encounters:  07/29/21 144 lb 11.2 oz (65.6 kg)  04/10/21 142 lb (64.4 kg)  02/27/21 140 lb (63.5 kg)      Studies/Labs Reviewed:   EKG:  EKG is ordered today.  Shows atrial paced,  ventricular sensed rhythm at 55 bpm, otherwise normal tracing.  Recent Labs: 07/29/2021: BUN 7; Creatinine, Ser 0.76; Hemoglobin 13.6; Platelets 223; Potassium 5.1; Sodium 140  09/13/2019 potassium 3.7, creatinine 0.8, hemoglobin A1c 7% Lipid Panel    Component Value Date/Time   CHOL 156 07/17/2011 0605   TRIG 107 07/17/2011 0605   HDL 52 07/17/2011 0605   CHOLHDL 3.0 07/17/2011 0605   VLDL 21 07/17/2011 0605   LDLCALC 83 07/17/2011 0605   09/13/2019 total cholesterol 130, HDL 53, LDL 54, triglycerides 255 09/21/2020 Chol 198, HDL 50, TG 121  ASSESSMENT:    1. SSS (sick sinus syndrome) ([place])   2. PAT (paroxysmal atrial tachycardia) ([place])   3. Pacemaker battery depletion   4. Hypertriglyceridemia   5. Pre-op testing      PLAN:  In order of problems listed above:  SSS: Rate response turned off of the device at [facility], turned back on today.  She has a lot of fatigue.  This should improve with a new pacemaker generator PAT: brief, infrequent and minimally symptomatic. No specific therapy. PPM: Pacemaker at ERI.  It actually took a long time for the device to clear ERI (only occurred on August 14) although the battery voltage is down 2.57 V and it is already pacing at less than the lower rate limit turn rate response on to help with the fatigue.  Not pacemaker dependent.  Scheduled for pacemaker generator change out first available date. This procedure has been fully reviewed with the patient and written informed consent has been obtained. HLP: All parameters acceptable on the latest lipid profile.   Medication Adjustments/Labs and Tests Ordered: Current medicines are reviewed at length with the patient today.  Concerns regarding medicines are outlined above.  Medication changes, Labs and Tests ordered today are listed in the Patient Instructions below. Patient Instructions  Medication Instructions: Your physician recommends that you continue on your current medications as directed.  Please refer to the Current Medication list given to you today.  * If you need a refill on your cardiac medications before your next appointment, please call your pharmacy. *  Labwork: Pre procedure lab work today:  BMET & CBC  * Will notify you of abnormal results, otherwise continue current treatment plan.*  Testing/Procedures: Your physician has recommended that you have a pacemaker/defibrillator generator change (battery change). Please follow the instructions below, located under the special instructions section.  Follow-Up: Your physician recommends that you schedule a wound check appointment 10-14 days, after your procedure on 08/12/21, with the device clinic.  Your physician recommends that you schedule a follow up appointment in 91 days, after your procedure on 08/12/21, with Dr. Sallyanne Kuster.  Thank you for choosing [facility]!!      Any Other Special Instructions Will Be Listed Below (If Applicable).    [facility] [place], [address] [place]  [place], [place] [address]  Phone: [phone] Fax: [phone]    Generator Change Procedure Instructions  You are scheduled for a Generator Change (battery change) on  08/12/21  with Dr. Sallyanne Kuster.  1. Please arrive at the [facility], Entrance "A"  at [facility] at  11:30am on the day of your procedure. (The address is [address])  2. DIET: You may have a light, early breakfast the morning of your procedure. NOTHING TO EAT AFTER 8:00 AM.  3. LABS: Labs completed 07/29/21  4. MEDICATIONS:  Nothing to hold  5.  Plan for an overnight stay.  Bring your insurance cards and a list of you medications.  6.  Wash your chest and neck with surgical scrub the evening before and the morning of your procedure.  Rinse well. Please review the surgical scrub instruction sheet given to you.   7. Your chest will need to be shaved prior to this procedure (if needed). We ask that  you do this yourself at home 1 to 2 days before or if uncomfortable/unable to do yourself, then it will be performed by the hospital staff the day of.  * Special note:  Every effort is made to have your procedure done on time.  Occasionally there are emergencies that present themselves at the hospital that may cause delays.  Please be patient if a delay does occur.                                                                                                           * If you have any questions after you get home, please call Lattie Haw, RN at [phone].    [place] - Preparing For Surgery  Before surgery, you can play an important role. Because skin is not sterile, your skin needs to be as free of germs as possible. You can reduce the number of germs on your skin by washing with CHG (chlorahexidine gluconate) Soap before surgery.  CHG is an antiseptic cleaner which kills germs and bonds with the skin to continue killing germs even after washing.   Please do not use if you have an allergy to CHG or antibacterial soaps.  If your skin becomes reddened/irritated stop using the CHG.   Do not shave (including legs and underarms) for at least 48  hours prior to first CHG shower.  It is OK to shave your face.  Please follow these instructions carefully:  1.  Shower the night before surgery and the morning of surgery with CHG.  2.  If you choose to wash your hair, wash your hair first as usual with your normal shampoo.  3.  After you shampoo, rinse your hair and body thoroughly to remove the shampoo.  4.  Use CHG as you would any other liquid soap.  You can apply CHG directly to the skin and wash gently with a clean washcloth. 5.  Apply the CHG Soap to your body ONLY FROM THE NECK DOWN.  Do not use on open wounds or open sores.  Avoid contact with your eyes, ears, mouth and genitals (private parts).    6.  Wash thoroughly, paying special attention to the area where your surgery will be  performed.  7.  Thoroughly rinse your body with warm water from the neck down.   8.  DO NOT shower/wash with your normal soap after using and rinsing off the CHG soap.  9.  Pat yourself dry with a clean towel.   10.  Wear clean pajamas.   11.  Place clean sheets on your bed the night of your first shower and do not sleep with pets.  Day of Surgery: Do not apply any deodorants/lotions.  Please wear clean clothes to the hospital/surgery center.    Signed, Brandy Klein, MD  07/31/2021 9:23 PM    [place], [place], [place]  [address] Phone: [phone]; Fax: [phone] ,mg

## 2021-07-29 NOTE — Patient Instructions (Addendum)
Medication Instructions: Your physician recommends that you continue on your current medications as directed. Please refer to the Current Medication list given to you today.  * If you need a refill on your cardiac medications before your next appointment, please call your pharmacy. *  Labwork: Pre procedure lab work today: BMET & CBC  * Will notify you of abnormal results, otherwise continue current treatment plan.*  Testing/Procedures: Your physician has recommended that you have a pacemaker/defibrillator generator change (battery change). Please follow the instructions below, located under the special instructions section.  Follow-Up: Your physician recommends that you schedule a wound check appointment 10-14 days, after your procedure on 08/12/21, with the device clinic.  Your physician recommends that you schedule a follow up appointment in 91 days, after your procedure on 08/12/21, with Dr. Sallyanne Kuster.  Thank you for choosing [facility]!!      Any Other Special Instructions Will Be Listed Below (If Applicable).    [facility] [place], [place]  [place], [place] [address]  Phone: [phone] Fax: [phone]    Generator Change Procedure Instructions  You are scheduled for a Generator Change (battery change) on  08/12/21  with Dr. Sallyanne Kuster.  1. Please arrive at the [facility], Entrance "A"  at [facility] at  11:30am on the day of your procedure. (The address is [address]  2. DIET: You may have a light, early breakfast the morning of your procedure. NOTHING TO EAT AFTER 8:00 AM.  3. LABS: Labs completed 07/29/21  4. MEDICATIONS:  Nothing to hold  5.  Plan for an overnight stay.  Bring your insurance cards and a list of you medications.  6.  Wash your chest and neck with surgical scrub the evening before and the morning of your procedure.  Rinse well. Please review the surgical scrub instruction  sheet given to you.   7. Your chest will need to be shaved prior to this procedure (if needed). We ask that you do this yourself at home 1 to 2 days before or if uncomfortable/unable to do yourself, then it will be performed by the hospital staff the day of.  * Special note:  Every effort is made to have your procedure done on time.  Occasionally there are emergencies that present themselves at the hospital that may cause delays.  Please be patient if a delay does occur.                                                                                                           * If you have any questions after you get home, please call Lattie Haw, RN at [phone].    [place] - Preparing For Surgery  Before surgery, you can play an important role. Because skin is not sterile, your skin needs to be as free of germs as possible. You can reduce the number of germs on your skin by washing with CHG (chlorahexidine gluconate) Soap before surgery.  CHG is an antiseptic cleaner which kills  germs and bonds with the skin to continue killing germs even after washing.   Please do not use if you have an allergy to CHG or antibacterial soaps.  If your skin becomes reddened/irritated stop using the CHG.   Do not shave (including legs and underarms) for at least 48 hours prior to first CHG shower.  It is OK to shave your face.  Please follow these instructions carefully:  1.  Shower the night before surgery and the morning of surgery with CHG.  2.  If you choose to wash your hair, wash your hair first as usual with your normal shampoo.  3.  After you shampoo, rinse your hair and body thoroughly to remove the shampoo.  4.  Use CHG as you would any other liquid soap.  You can apply CHG directly to the skin and wash gently with a clean washcloth. 5.  Apply the CHG Soap to your body ONLY FROM THE NECK DOWN.  Do not use on open wounds or open sores.  Avoid contact with your eyes, ears, mouth and genitals (private  parts).    6.  Wash thoroughly, paying special attention to the area where your surgery will be performed.  7.  Thoroughly rinse your body with warm water from the neck down.   8.  DO NOT shower/wash with your normal soap after using and rinsing off the CHG soap.  9.  Pat yourself dry with a clean towel.   10.  Wear clean pajamas.   11.  Place clean sheets on your bed the night of your first shower and do not sleep with pets.  Day of Surgery: Do not apply any deodorants/lotions.  Please wear clean clothes to the hospital/surgery center.

## 2021-07-29 NOTE — Progress Notes (Signed)
Cardiology Office Note    Date:  07/31/2021   ID:  [name], DOB 1951/04/18, MRN [id number]  PCP:  Redmond School, MD  Cardiologist:   Sanda Klein, MD   Chief complaint: pacemaker ERI  History of Present Illness:  [name] is a 70 y.o. female with tachycardia-bradycardia syndrome (sinus bradycardia and paroxysmal atrial tachycardia) with a dual-chamber permanent pacemaker (St. Jude Accent, implanted 2012) returning for pacemaker follow-up.   Feeling poorly for the last couple of weeks.  Has a lot of fatigue.  Her pacemaker just declared ERI yesterday, battery voltage is actually down to 2.57 V (ERI 2.60 V) in the current pacing rate is 55 bpm although the lower rate limit is set at 60 bpm.  Rate response is off.  Lead parameters are normal and there have been no significant episodes of arrhythmia.  She is not pacemaker dependent.  Underlying rhythm is sinus bradycardia in the low 50s.  She has infrequent episodes of paroxysmal atrial tachycardia, usually under 10 seconds (one episode 3'42" in October).  She continues to have occasional sustained but very brief episodes of atrial tachycardia, up to 5 minutes in duration.  These are pretty rare.  They are asymptomatic.  The patient specifically denies any chest pain at rest exertion, dyspnea at rest or with exertion, orthopnea, paroxysmal nocturnal dyspnea, syncope, palpitations, focal neurological deficits, intermittent claudication, lower extremity edema, unexplained weight gain, cough, hemoptysis or wheezing.   Past Medical History:  Diagnosis Date   Anxiety    Diverticulosis of colon    Dysrhythmia    AFib   History of kidney stones    Osteoarthritis    Osteoporosis    Paroxysmal atrial fibrillation (HCC)    with SVT response   Presence of permanent cardiac pacemaker    Sinus node dysfunction (HCC)    Tachy-brady syndrome ([place])     Past Surgical History:  Procedure Laterality Date   ABDOMINAL  HYSTERECTOMY     APPENDECTOMY     BREAST BIOPSY     right, benign   CERVICAL SPINE SURGERY     two   CHOLECYSTECTOMY     COLONOSCOPY  2007   sigmoid diverticula, few ulcerations of terminal ileum, biopsies of TI unremarkable, random colon bx neg for microscopic colitis.   COLONOSCOPY  06/04/11   Dr. Gala Romney- pegmentation of the rectum, pancolonic diverticula, solitary ulcer in the mouth of the ileocecal valve, distal 15 cm of the terminal ileum appeared normal. Random colon biopsies were benign. Ileocecal valve ulcer or was benign without any supportive evidence of inflammatory bowel disease.   COLONOSCOPY WITH PROPOFOL N/A 04/26/2018   Dr. Gala Romney: diverticulosis, no future screening colonoscopies planned due to age.   ESOPHAGOGASTRODUODENOSCOPY  06/04/11   Dr. Vivi Ferns esophagus, deffuse petechial and gastric submucosal petechiae- benign mucousa on bx. Small bowel biopsy benign. No H. pylori.   ESOPHAGOGASTRODUODENOSCOPY (EGD) WITH PROPOFOL N/A 07/28/2019   Dr. Emerson Monte: Small hiatal hernia, dyspepsia likely due to Fosamax   INSERT / REPLACE / REMOVE PACEMAKER     left hip surgery     in [place], around 2018   [place]  07/2011   s/p hysterectomy     sb capsule study  2009   Two single small nonbleeding AVMs in the  proximal small bowel, single lymphangiectasia in mid small bowel.   vocal cord polypectomy      Current Medications: Outpatient Medications Prior to Visit  Medication Sig Dispense Refill  ALPRAZolam (XANAX) 0.5 MG tablet Take 0.5 mg by mouth 3 (three) times daily as needed.     b complex vitamins tablet Take 1 tablet by mouth daily.     CALCIUM-VITAMIN D PO Take 1 tablet by mouth 2 (two) times daily.     diclofenac sodium (VOLTAREN) 1 % GEL Apply 1 application topically daily as needed (pain).     gabapentin (NEURONTIN) 300 MG capsule Take 1 capsule (300 mg total) by mouth 3 (three) times daily. 90 capsule 5   meloxicam (MOBIC) 7.5 MG tablet Take 1  tablet (7.5 mg total) by mouth daily. 30 tablet 5   mirtazapine (REMERON) 15 MG tablet Take 15 mg by mouth at bedtime. Patient takes 2 tablets at bedtime     Multiple Vitamin (MULTIVITAMIN WITH MINERALS) TABS tablet Take 1 tablet by mouth daily.     Omega-3 Fatty Acids (FISH OIL) 1000 MG CAPS Take by mouth.     oxyCODONE (ROXICODONE) 15 MG immediate release tablet Take 15 mg by mouth every 6 (six) hours as needed (for pain).      pantoprazole (PROTONIX) 40 MG tablet Take 1 tablet (40 mg total) by mouth daily before breakfast. 30 tablet 11   vitamin C (ASCORBIC ACID) 500 MG tablet Take 500 mg by mouth daily.     zolpidem (AMBIEN) 10 MG tablet Take 10 mg by mouth at bedtime.     ferrous sulfate 325 (65 FE) MG tablet Take 325 mg by mouth daily as needed (fatigue). (Patient not taking: Reported on 07/29/2021)     hydrOXYzine (ATARAX/VISTARIL) 25 MG tablet Take 25 mg by mouth every 8 (eight) hours as needed for anxiety. (Patient not taking: Reported on 07/29/2021)     No facility-administered medications prior to visit.     Allergies:   Ampicillin and Iodine   Social History   Socioeconomic History   Marital status: Divorced    Spouse name: Not on file   Number of children: Not on file   Years of education: Not on file   Highest education level: Not on file  Occupational History   Occupation: [employer]  Tobacco Use   Smoking status: Former    Types: Cigarettes    Quit date: 12/15/1973    Years since quitting: 47.6   Smokeless tobacco: Never   Tobacco comments:    occas  Vaping Use   Vaping Use: Never used  Substance and Sexual Activity   Alcohol use: No   Drug use: No   Sexual activity: Not on file  Other Topics Concern   Not on file  Social History Narrative   Has one son   Social Determinants of Health   Financial Resource Strain: Not on file  Food Insecurity: Not on file  Transportation Needs: Not on file  Physical Activity: Not on file  Stress: Not on file  Social  Connections: Not on file     Family History:  The patient's family history includes COPD (age of onset: 25) in her mother; Diabetes in her brother; GI problems in her sister; Heart attack (age of onset: 27) in her father; Hypertension in her brother; Stroke (age of onset: 78) in her father.   ROS:   Please see the history of present illness.    ROS All other systems are reviewed and are negative.   PHYSICAL EXAM:   VS:  BP 134/68 (BP Location: Left Arm, Patient Position: Sitting, Cuff Size: Normal)   Pulse (!) 55   Ht 5'  2" (1.575 m)   Wt 144 lb 11.2 oz (65.6 kg)   SpO2 98%   BMI 26.47 kg/m      General: Alert, oriented x3, no distress, healthy left subclavian pacemaker site Head: no evidence of trauma, PERRL, EOMI, no exophtalmos or lid lag, no myxedema, no xanthelasma; normal ears, nose and oropharynx Neck: normal jugular venous pulsations and no hepatojugular reflux; brisk carotid pulses without delay and no carotid bruits Chest: clear to auscultation, no signs of consolidation by percussion or palpation, normal fremitus, symmetrical and full respiratory excursions Cardiovascular: normal position and quality of the apical impulse, regular rhythm, normal first and second heart sounds, no murmurs, rubs or gallops Abdomen: no tenderness or distention, no masses by palpation, no abnormal pulsatility or arterial bruits, normal bowel sounds, no hepatosplenomegaly Extremities: no clubbing, cyanosis or edema; 2+ radial, ulnar and brachial pulses bilaterally; 2+ right femoral, posterior tibial and dorsalis pedis pulses; 2+ left femoral, posterior tibial and dorsalis pedis pulses; no subclavian or femoral bruits Neurological: grossly nonfocal Psych: Normal mood and affect    Wt Readings from Last 3 Encounters:  07/29/21 144 lb 11.2 oz (65.6 kg)  04/10/21 142 lb (64.4 kg)  02/27/21 140 lb (63.5 kg)      Studies/Labs Reviewed:   EKG:  EKG is ordered today.  Shows atrial paced,  ventricular sensed rhythm at 55 bpm, otherwise normal tracing.  Recent Labs: 07/29/2021: BUN 7; Creatinine, Ser 0.76; Hemoglobin 13.6; Platelets 223; Potassium 5.1; Sodium 140  09/13/2019 potassium 3.7, creatinine 0.8, hemoglobin A1c 7% Lipid Panel    Component Value Date/Time   CHOL 156 07/17/2011 0605   TRIG 107 07/17/2011 0605   HDL 52 07/17/2011 0605   CHOLHDL 3.0 07/17/2011 0605   VLDL 21 07/17/2011 0605   LDLCALC 83 07/17/2011 0605   09/13/2019 total cholesterol 130, HDL 53, LDL 54, triglycerides 255 09/21/2020 Chol 198, HDL 50, TG 121  ASSESSMENT:    1. SSS (sick sinus syndrome) ([place])   2. PAT (paroxysmal atrial tachycardia) ([place])   3. Pacemaker battery depletion   4. Hypertriglyceridemia   5. Pre-op testing      PLAN:  In order of problems listed above:  SSS: Rate response turned off of the device at [facility], turned back on today.  She has a lot of fatigue.  This should improve with a new pacemaker generator PAT: brief, infrequent and minimally symptomatic. No specific therapy. PPM: Pacemaker at ERI.  It actually took a long time for the device to clear ERI (only occurred on August 14) although the battery voltage is down 2.57 V and it is already pacing at less than the lower rate limit turn rate response on to help with the fatigue.  Not pacemaker dependent.  Scheduled for pacemaker generator change out first available date. This procedure has been fully reviewed with the patient and written informed consent has been obtained. HLP: All parameters acceptable on the latest lipid profile.   Medication Adjustments/Labs and Tests Ordered: Current medicines are reviewed at length with the patient today.  Concerns regarding medicines are outlined above.  Medication changes, Labs and Tests ordered today are listed in the Patient Instructions below. Patient Instructions  Medication Instructions: Your physician recommends that you continue on your current medications as directed.  Please refer to the Current Medication list given to you today.  * If you need a refill on your cardiac medications before your next appointment, please call your pharmacy. *  Labwork: Pre procedure lab work today:  BMET & CBC  * Will notify you of abnormal results, otherwise continue current treatment plan.*  Testing/Procedures: Your physician has recommended that you have a pacemaker/defibrillator generator change (battery change). Please follow the instructions below, located under the special instructions section.  Follow-Up: Your physician recommends that you schedule a wound check appointment 10-14 days, after your procedure on 08/12/21, with the device clinic.  Your physician recommends that you schedule a follow up appointment in 91 days, after your procedure on 08/12/21, with Dr. Sallyanne Kuster.  Thank you for choosing [facility]!!      Any Other Special Instructions Will Be Listed Below (If Applicable).    [facility] [place], [place]  [address], [place] [address]  Phone: [phone] Fax: [phone]    Generator Change Procedure Instructions  You are scheduled for a Generator Change (battery change) on  08/12/21  with Dr. Sallyanne Kuster.  1. Please arrive at the [facility], Entrance "A"  at [facility] at  11:30am on the day of your procedure. (The address is [address])  2. DIET: You may have a light, early breakfast the morning of your procedure. NOTHING TO EAT AFTER 8:00 AM.  3. LABS: Labs completed 07/29/21  4. MEDICATIONS:  Nothing to hold  5.  Plan for an overnight stay.  Bring your insurance cards and a list of you medications.  6.  Wash your chest and neck with surgical scrub the evening before and the morning of your procedure.  Rinse well. Please review the surgical scrub instruction sheet given to you.   7. Your chest will need to be shaved prior to this procedure (if needed). We ask that  you do this yourself at home 1 to 2 days before or if uncomfortable/unable to do yourself, then it will be performed by the hospital staff the day of.  * Special note:  Every effort is made to have your procedure done on time.  Occasionally there are emergencies that present themselves at the hospital that may cause delays.  Please be patient if a delay does occur.                                                                                                           * If you have any questions after you get home, please call Lattie Haw, RN at [phone].    [place] - Preparing For Surgery  Before surgery, you can play an important role. Because skin is not sterile, your skin needs to be as free of germs as possible. You can reduce the number of germs on your skin by washing with CHG (chlorahexidine gluconate) Soap before surgery.  CHG is an antiseptic cleaner which kills germs and bonds with the skin to continue killing germs even after washing.   Please do not use if you have an allergy to CHG or antibacterial soaps.  If your skin becomes reddened/irritated stop using the CHG.   Do not shave (including legs and underarms) for at least 48  hours prior to first CHG shower.  It is OK to shave your face.  Please follow these instructions carefully:  1.  Shower the night before surgery and the morning of surgery with CHG.  2.  If you choose to wash your hair, wash your hair first as usual with your normal shampoo.  3.  After you shampoo, rinse your hair and body thoroughly to remove the shampoo.  4.  Use CHG as you would any other liquid soap.  You can apply CHG directly to the skin and wash gently with a clean washcloth. 5.  Apply the CHG Soap to your body ONLY FROM THE NECK DOWN.  Do not use on open wounds or open sores.  Avoid contact with your eyes, ears, mouth and genitals (private parts).    6.  Wash thoroughly, paying special attention to the area where your surgery will be  performed.  7.  Thoroughly rinse your body with warm water from the neck down.   8.  DO NOT shower/wash with your normal soap after using and rinsing off the CHG soap.  9.  Pat yourself dry with a clean towel.   10.  Wear clean pajamas.   11.  Place clean sheets on your bed the night of your first shower and do not sleep with pets.  Day of Surgery: Do not apply any deodorants/lotions.  Please wear clean clothes to the hospital/surgery center.    Signed, Sanda Klein, MD  07/31/2021 9:23 PM    [facility] [place], [place], [place]  [address] Phone: [phone]; Fax: [phone] ,mg

## 2021-08-07 NOTE — Addendum Note (Signed)
Addended by: Carylon Perches on: 08/07/2021 04:56 PM   Modules accepted: Level of Service

## 2021-08-07 NOTE — Progress Notes (Signed)
Remote pacemaker transmission.   

## 2021-08-11 ENCOUNTER — Other Ambulatory Visit: Payer: Self-pay | Admitting: *Deleted

## 2021-08-11 DIAGNOSIS — I495 Sick sinus syndrome: Secondary | ICD-10-CM

## 2021-08-12 ENCOUNTER — Ambulatory Visit (HOSPITAL_COMMUNITY)
Admission: RE | Admit: 2021-08-12 | Discharge: 2021-08-12 | Disposition: A | Discharge status: Home / Self Care | Payer: Medicare Other | Attending: Cardiovascular Disease | Admitting: Cardiovascular Disease

## 2021-08-12 ENCOUNTER — Ambulatory Visit (INDEPENDENT_AMBULATORY_CARE_PROVIDER_SITE_OTHER): Payer: PPO

## 2021-08-12 ENCOUNTER — Encounter (HOSPITAL_COMMUNITY)
Admission: RE | Disposition: A | Discharge status: Home / Self Care | Payer: Self-pay | Source: Home / Self Care | Attending: Cardiovascular Disease

## 2021-08-12 ENCOUNTER — Other Ambulatory Visit: Payer: Self-pay

## 2021-08-12 DIAGNOSIS — Z8249 Family history of ischemic heart disease and other diseases of the circulatory system: Secondary | ICD-10-CM | POA: Insufficient documentation

## 2021-08-12 DIAGNOSIS — I495 Sick sinus syndrome: Secondary | ICD-10-CM | POA: Diagnosis present

## 2021-08-12 DIAGNOSIS — E781 Pure hyperglyceridemia: Secondary | ICD-10-CM | POA: Insufficient documentation

## 2021-08-12 DIAGNOSIS — Z79899 Other long term (current) drug therapy: Secondary | ICD-10-CM | POA: Diagnosis not present

## 2021-08-12 DIAGNOSIS — Z881 Allergy status to other antibiotic agents status: Secondary | ICD-10-CM | POA: Diagnosis not present

## 2021-08-12 DIAGNOSIS — E785 Hyperlipidemia, unspecified: Secondary | ICD-10-CM | POA: Insufficient documentation

## 2021-08-12 DIAGNOSIS — I471 Supraventricular tachycardia: Secondary | ICD-10-CM | POA: Diagnosis not present

## 2021-08-12 DIAGNOSIS — Z91048 Other nonmedicinal substance allergy status: Secondary | ICD-10-CM | POA: Diagnosis not present

## 2021-08-12 DIAGNOSIS — Z87891 Personal history of nicotine dependence: Secondary | ICD-10-CM | POA: Diagnosis not present

## 2021-08-12 DIAGNOSIS — Z45010 Encounter for checking and testing of cardiac pacemaker pulse generator [battery]: Secondary | ICD-10-CM

## 2021-08-12 HISTORY — PX: PPM GENERATOR CHANGEOUT: EP1233

## 2021-08-12 SURGERY — PPM GENERATOR CHANGEOUT

## 2021-08-12 MED ORDER — SODIUM CHLORIDE 0.9% FLUSH: 3.0000 mL | INTRAVENOUS | Status: DC | PRN

## 2021-08-12 MED ORDER — SODIUM CHLORIDE 0.9 % IV SOLN
80.0000 mg | INTRAVENOUS | Status: AC
  Administered 2021-08-12: 80 mg

## 2021-08-12 MED ORDER — ONDANSETRON HCL 4 MG/2ML IJ SOLN: 4.0000 mg | Freq: Four times a day (QID) | INTRAMUSCULAR | Status: DC | PRN

## 2021-08-12 MED ORDER — CHLORHEXIDINE GLUCONATE 4 % EX LIQD: 4.0000 "application " | Freq: Once | CUTANEOUS | Status: DC

## 2021-08-12 MED ORDER — MIDAZOLAM HCL 5 MG/5ML IJ SOLN
INTRAMUSCULAR | Status: DC | PRN
  Administered 2021-08-12: 1 mg via INTRAVENOUS

## 2021-08-12 MED ORDER — SODIUM CHLORIDE 0.9 % IV SOLN: INTRAVENOUS | Status: DC

## 2021-08-12 MED ORDER — VANCOMYCIN HCL IN DEXTROSE 1-5 GM/200ML-% IV SOLN
1000.0000 mg | INTRAVENOUS | Status: AC
  Administered 2021-08-12: 1000 mg via INTRAVENOUS

## 2021-08-12 MED ORDER — MIDAZOLAM HCL 5 MG/5ML IJ SOLN
INTRAMUSCULAR | Status: AC
  Filled 2021-08-12: qty 5

## 2021-08-12 MED ORDER — SODIUM CHLORIDE 0.9% FLUSH: 3.0000 mL | Freq: Two times a day (BID) | INTRAVENOUS | Status: DC

## 2021-08-12 MED ORDER — VANCOMYCIN HCL IN DEXTROSE 1-5 GM/200ML-% IV SOLN
INTRAVENOUS | Status: AC
  Filled 2021-08-12: qty 200

## 2021-08-12 MED ORDER — SODIUM CHLORIDE 0.9 % IV SOLN
INTRAVENOUS | Status: AC
  Filled 2021-08-12: qty 2

## 2021-08-12 MED ORDER — LIDOCAINE HCL (PF) 1 % IJ SOLN
INTRAMUSCULAR | Status: DC | PRN
  Administered 2021-08-12: 30 mL

## 2021-08-12 MED ORDER — FENTANYL CITRATE (PF) 100 MCG/2ML IJ SOLN
INTRAMUSCULAR | Status: DC | PRN
  Administered 2021-08-12: 25 ug via INTRAVENOUS

## 2021-08-12 MED ORDER — SODIUM CHLORIDE 0.9 % IV SOLN: 250.0000 mL | INTRAVENOUS | Status: DC | PRN

## 2021-08-12 MED ORDER — ACETAMINOPHEN 325 MG PO TABS: 325.0000 mg | ORAL_TABLET | ORAL | Status: DC | PRN

## 2021-08-12 MED ORDER — LIDOCAINE HCL (PF) 1 % IJ SOLN
INTRAMUSCULAR | Status: AC
  Filled 2021-08-12: qty 30

## 2021-08-12 MED ORDER — FENTANYL CITRATE (PF) 100 MCG/2ML IJ SOLN
INTRAMUSCULAR | Status: AC
  Filled 2021-08-12: qty 2

## 2021-08-12 SURGICAL SUPPLY — 4 items
CABLE SURGICAL S-101-97-12 (CABLE) ×2 IMPLANT
PACEMAKER ASSURITY DR-RF (Pacemaker) ×1 IMPLANT
PAD PRO RADIOLUCENT 2001M-C (PAD) ×2 IMPLANT
TRAY PACEMAKER INSERTION (PACKS) ×2 IMPLANT

## 2021-08-12 NOTE — Discharge Instructions (Signed)

## 2021-08-12 NOTE — Op Note (Signed)
Procedure report  Procedure performed:  1. Dual chamber pacemaker generator changeout  2. Light sedation  Reason for procedure:  1. Device generator at elective replacement interval  2. SSS (sick sinus syndrome) Procedure performed by:  Sanda Klein, MD  Complications:  None  Estimated blood loss:  <5 mL  Medications administered during procedure:  Vancomycin 1 g intravenously, lidocaine 1% 30 mL locally, fentanyl 25 mcg intravenously, Versed 1 mg intravenously Device details:   [place] MR,  model number [id number], serial number [id number] Right atrial lead (chronic) St. Jude, model number 2088TC-46, serial number [id number] (implanted 07/17/2011) Right ventricular lead (chronic)  St. Jude, model number 2088TC-52, serial number [id number] (implanted 07/17/2011)  Explanted generator 1. Dual chamber pacemaker generator changeout  2. Light sedation  Reason for procedure:  1. Device generator at elective replacement interval  2. SSS (sick sinus syndrome) Procedure performed by:  Sanda Klein, MD  Complications:  None  Estimated blood loss:  <5 mL  Medications administered during procedure:  Vancomycin 1 g intravenously, lidocaine 1% 30 mL locally, fentanyl 25 mcg intravenously, Versed 1 mg intravenously Device details:   [place] MR,  model number [id number], serial number [id number] Right atrial lead (chronic) St. Jude, model number 2088TC-46, serial number [id number] (implanted 07/17/2011) Right ventricular lead (chronic)  St. Jude, model number 2088TC-52, serial number [id number] (implanted 07/17/2011)  Explanted generator St. Jude Accent DR (implanted 07/17/2011)  Procedure details:  After the risks and benefits of the procedure were discussed the patient provided informed consent. She was brought to the cardiac catheter lab in the fasting state. The patient was prepped and draped in usual sterile fashion. Local anesthesia with 1% lidocaine  was administered to to the left infraclavicular area. A 5-6cm horizontal incision was made parallel with and 2-3 cm caudal to the left clavicle, in the area of an old scar.  Using minimal electrocautery and mostly sharp and blunt dissection the prepectoral pocket was opened carefully to avoid injury to the loops of chronic leads. Extensive dissection was not necessary. The device was explanted. The pocket was carefully inspected for hemostasis and flushed with copious amounts of antibiotic solution.  The leads were disconnected from the old generator and testing of the lead parameters later showed excellent values. The new generator was connected to the chronic leads, with appropriate pacing noted.   The entire system was then carefully inserted in the pocket with care been taking that the leads and device assumed a comfortable position without pressure on the incision. Great care was taken that the leads be located deep to the generator. The pocket was then closed in layers using 2 layers of 2-0 Vicryl, one layer of 3-0 Vicryl and cutaneous steristrips after which a sterile dressing was applied.   At the end of the procedure the following lead parameters were encountered:   Right atrial lead sensed P waves 1.6 mV, impedance 450 ohms, threshold 0.5 at 0.4 ms pulse width.  Right ventricular lead sensed R waves  27 mV, impedance 530 ohms, threshold 1.25 at 0.4 ms pulse width.  Procedure details:  After the risks and benefits of the procedure were discussed the patient provided informed consent. She was brought to the cardiac catheter lab in the fasting state. The patient was prepped and draped in usual sterile fashion. Local anesthesia with 1% lidocaine was administered to to the left infraclavicular area. A 5-6cm horizontal incision was made parallel with and 2-3 cm caudal to the  left clavicle, in the area of an old scar.  Using minimal electrocautery and mostly sharp and blunt dissection the prepectoral  pocket was opened carefully to avoid injury to the loops of chronic leads. Extensive dissection was not necessary. The device was explanted. The pocket was carefully inspected for hemostasis and flushed with copious amounts of antibiotic solution.  The leads were disconnected from the old generator and testing of the lead parameters later showed excellent values. The new generator was connected to the chronic leads, with appropriate pacing noted.   The entire system was then carefully inserted in the pocket with care been taking that the leads and device assumed a comfortable position without pressure on the incision. Great care was taken that the leads be located deep to the generator. The pocket was then closed in layers using 2 layers of 2-0 Vicryl, one layer of 3-0 Vicryl and cutaneous steristrips after which a sterile dressing was applied.   At the end of the procedure the following lead parameters were encountered:   Right atrial lead sensed P waves 1.6 mV, impedance 450 ohms, threshold 0.5 at 0.4 ms pulse width.  Right ventricular lead sensed R waves  27 mV, impedance 530 ohms, threshold 1.25 at 0.4 ms pulse width.  Sanda Klein, MD, [facility] [phone] office [phone] pager

## 2021-08-12 NOTE — Interval H&P Note (Signed)
History and Physical Interval Note:  08/12/2021 12:19 PM  [name]  has presented today for surgery, with the diagnosis of eri.  The various methods of treatment have been discussed with the patient and family. After consideration of risks, benefits and other options for treatment, the patient has consented to  Procedure(s): PPM GENERATOR CHANGEOUT (N/A) as a surgical intervention.  The patient's history has been reviewed, patient examined, no change in status, stable for surgery.  I have reviewed the patient's chart and labs.  Questions were answered to the patient's satisfaction.     [name]

## 2021-08-12 NOTE — Progress Notes (Signed)
Pt ambulated without difficulty or bleeding.   Discharged home with her son who will drive and stay with pt x 24 hrs.

## 2021-08-13 ENCOUNTER — Encounter (HOSPITAL_COMMUNITY): Payer: Self-pay | Admitting: Cardiovascular Disease

## 2021-08-13 LAB — CUP PACEART REMOTE DEVICE CHECK
Battery Remaining Longevity: 0 mo
Battery Voltage: 2.57 V
Brady Statistic AP VP Percent: 1 %
Brady Statistic AP VS Percent: 26 %
Brady Statistic AS VP Percent: 1 %
Brady Statistic AS VS Percent: 74 %
Brady Statistic RA Percent Paced: 26 %
Brady Statistic RV Percent Paced: 1 %
Date Time Interrogation Session: 20220828023040
Implantable Pulse Generator Implant Date: 20120802
Lead Channel Impedance Value: 440 Ohm
Lead Channel Impedance Value: 490 Ohm
Lead Channel Pacing Threshold Amplitude: 0.375 V
Lead Channel Pacing Threshold Amplitude: 1.125 V
Lead Channel Pacing Threshold Pulse Width: 0.5 ms
Lead Channel Pacing Threshold Pulse Width: 0.5 ms
Lead Channel Sensing Intrinsic Amplitude: 2.2 mV
Lead Channel Sensing Intrinsic Amplitude: 2.4 mV
Lead Channel Setting Pacing Amplitude: 1.375
Lead Channel Setting Pacing Amplitude: 1.375
Lead Channel Setting Pacing Pulse Width: 0.5 ms
Lead Channel Setting Sensing Sensitivity: 1 mV
Pulse Gen Model: 2210
Pulse Gen Serial Number: 7250678

## 2021-08-14 ENCOUNTER — Telehealth: Payer: Self-pay | Admitting: Cardiovascular Disease

## 2021-08-14 NOTE — Telephone Encounter (Signed)
Patient called with questions concerning her wound check set. Patient dont understand why her appt was cancel for Thursday. Please advise

## 2021-08-14 NOTE — Telephone Encounter (Signed)
Spoke to the patient who was calling to see why her post gen change wound appointment was canceled on 9/8. She has been advised that a message will be sent to the device clinic to get this rescheduled.

## 2021-08-15 ENCOUNTER — Ambulatory Visit: Payer: Medicare Other | Admitting: Orthopedic Surgery

## 2021-08-16 NOTE — Telephone Encounter (Signed)
It was an accident. I was cancelling her home remotes to put the patient on a new schedule. [place] [facility] [place] scheduled the patient a new wound check appointment.

## 2021-08-21 DIAGNOSIS — K219 Gastro-esophageal reflux disease without esophagitis: Secondary | ICD-10-CM | POA: Diagnosis not present

## 2021-08-21 DIAGNOSIS — J329 Chronic sinusitis, unspecified: Secondary | ICD-10-CM | POA: Diagnosis not present

## 2021-08-21 DIAGNOSIS — G894 Chronic pain syndrome: Secondary | ICD-10-CM | POA: Diagnosis not present

## 2021-08-21 DIAGNOSIS — E063 Autoimmune thyroiditis: Secondary | ICD-10-CM | POA: Diagnosis not present

## 2021-08-21 DIAGNOSIS — J209 Acute bronchitis, unspecified: Secondary | ICD-10-CM | POA: Diagnosis not present

## 2021-08-22 ENCOUNTER — Ambulatory Visit: Payer: Medicare Other

## 2021-08-23 NOTE — Addendum Note (Signed)
Addended by: Douglass Rivers D on: 08/23/2021 12:55 PM   Modules accepted: Level of Service

## 2021-08-23 NOTE — Progress Notes (Signed)
Remote pacemaker transmission.   

## 2021-08-27 ENCOUNTER — Ambulatory Visit (INDEPENDENT_AMBULATORY_CARE_PROVIDER_SITE_OTHER): Payer: Medicare Other | Admitting: Student

## 2021-08-27 ENCOUNTER — Other Ambulatory Visit: Payer: Self-pay

## 2021-08-27 ENCOUNTER — Encounter: Payer: Self-pay | Admitting: Student

## 2021-08-27 VITALS — BP 112/58 | HR 72 | Ht 62.0 in | Wt 141.6 lb

## 2021-08-27 DIAGNOSIS — I495 Sick sinus syndrome: Secondary | ICD-10-CM | POA: Diagnosis not present

## 2021-08-27 LAB — CUP PACEART INCLINIC DEVICE CHECK
Battery Remaining Longevity: 142 mo
Battery Voltage: 3.1 V
Brady Statistic RA Percent Paced: 18 %
Brady Statistic RV Percent Paced: 0.08 %
Date Time Interrogation Session: 20220913122504
Implantable Lead Implant Date: 20120802
Implantable Lead Implant Date: 20120802
Implantable Lead Location: 753859
Implantable Lead Location: 753860
Implantable Pulse Generator Implant Date: 20220829
Lead Channel Impedance Value: 462.5 Ohm
Lead Channel Impedance Value: 462.5 Ohm
Lead Channel Pacing Threshold Amplitude: 0.5 V
Lead Channel Pacing Threshold Amplitude: 0.5 V
Lead Channel Pacing Threshold Amplitude: 1.125 V
Lead Channel Pacing Threshold Pulse Width: 0.4 ms
Lead Channel Pacing Threshold Pulse Width: 0.4 ms
Lead Channel Pacing Threshold Pulse Width: 0.4 ms
Lead Channel Sensing Intrinsic Amplitude: 2.8 mV
Lead Channel Sensing Intrinsic Amplitude: 2.8 mV
Lead Channel Setting Pacing Amplitude: 1.375
Lead Channel Setting Pacing Amplitude: 2 V
Lead Channel Setting Pacing Pulse Width: 0.4 ms
Lead Channel Setting Sensing Sensitivity: 0.5 mV
Pulse Gen Model: 2272
Pulse Gen Serial Number: 6519548

## 2021-08-27 NOTE — Progress Notes (Signed)
Wound check appointment. Steri-strips removed. Wound without redness or edema. Incision edges approximated, wound well healed. Normal device function. Thresholds, sensing, and impedances consistent with implant measurements. Device programmed at appropriate chronic safety margins. Histogram distribution appropriate for patient and level of activity. No mode switches or high ventricular rates noted. Patient educated about wound care, arm mobility, lifting restrictions. ROV in 3 months with Dr. Sallyanne Kuster

## 2021-09-02 ENCOUNTER — Encounter: Payer: Self-pay | Admitting: Orthopedic Surgery

## 2021-09-02 ENCOUNTER — Other Ambulatory Visit: Payer: Self-pay

## 2021-09-02 ENCOUNTER — Ambulatory Visit: Payer: Medicare Other

## 2021-09-02 ENCOUNTER — Ambulatory Visit: Payer: Medicare Other | Admitting: Orthopedic Surgery

## 2021-09-02 VITALS — BP 130/61 | HR 72 | Ht 62.0 in | Wt 141.0 lb

## 2021-09-02 DIAGNOSIS — M79641 Pain in right hand: Secondary | ICD-10-CM

## 2021-09-02 DIAGNOSIS — M79642 Pain in left hand: Secondary | ICD-10-CM

## 2021-09-02 NOTE — Patient Instructions (Signed)
Driving Directions to [facility] from [facility] Office address is [address] [place] The phone number is [phone]  Dr Newton  1. Start out going north on [address] toward [address]/[place].  Then 0.02 miles0.02 total miles 2. Take the 1st right onto [address]/[place]. Continue to follow [address].  If you reach [address] you've gone a little too far  Then 0.58 miles0.60 total miles 3. Turn right onto [address]  [address] just past [address]  Then 2.25 miles2.85 total miles 4. Take the [address] ramp toward [place].  Then 0.25 miles3.10 total miles 5. Merge onto [address].  Then 18.17 miles21.28 total miles 6. Merge onto [address]  Then 1.47 miles22.74 total miles 7. Turn right onto [address]  [address] just past [address]  Then 0.11 miles22.85 total miles  8. [address], [place], [place] [address], [address] is on the left.  

## 2021-09-02 NOTE — Progress Notes (Signed)
Chief Complaint  Patient presents with   Hand Pain    Pt states all of her joints in her hands are hurting.    70 year old female presents with complaints of several months where her hands are hurting and very stiff and sore in the morning primarily the small joints of the hand but also of the metacarpophalangeal joints.  She runs warm water over her hands to get them loose and wraps them in a warm cloth.  By lunchtime she says she can finally closed them and then she seems to have some symptoms of a right long finger triggering as well as a left thumb  She says her primary care doctor did some blood work on her and told her that her rheumatoid tests were negative as were her Lyme test  She is currently taking 2 ibuprofen and a Tylenol twice a day   Past Medical History:  Diagnosis Date   Anxiety    Diverticulosis of colon    Dysrhythmia    AFib   History of kidney stones    Osteoarthritis    Osteoporosis    Paroxysmal atrial fibrillation (HCC)    with SVT response   Presence of permanent cardiac pacemaker    Sinus node dysfunction (HCC)    Tachy-brady syndrome ([place])    Past Medical History:  Diagnosis Date   Anxiety    Diverticulosis of colon    Dysrhythmia    AFib   History of kidney stones    Osteoarthritis    Osteoporosis    Paroxysmal atrial fibrillation (HCC)    with SVT response   Presence of permanent cardiac pacemaker    Sinus node dysfunction (HCC)    Tachy-brady syndrome (HCC)     BP 130/61   Pulse 72   Ht '5\' 2"'$  (1.575 m)   Wt 141 lb (64 kg)   BMI 25.79 kg/m   Overall appearance she is noted to be ectomorphic in body habitus normal development grooming and hygiene  Pulse and perfusion color temperature normal both hands good capillary refill  There are no sensory deficits and she is awake alert and oriented x3 with pleasant mood  No skin rashes or lesions are noted on either hand  She has some small nodules on some of the IP joints nothing major  there is no swelling or tenderness of the joints today  Imaging Radiographic imaging showed no evidence of arthritis in either hand  Her symptoms are so prominent that I am going to order a nerve conduction study to make sure she does not have carpal tunnel syndrome she is agreeable.

## 2021-09-13 ENCOUNTER — Telehealth: Payer: Self-pay | Admitting: Radiology

## 2021-09-13 NOTE — Telephone Encounter (Signed)
We faxed records release to [facility] for records and labs They sent records no labs I called and sent another fax, need labs, no response I called patient to advise we have not gotten labs.  No answer  Her voicemail for cell says [name] not sure if that's her or not

## 2021-09-23 DIAGNOSIS — G4700 Insomnia, unspecified: Secondary | ICD-10-CM | POA: Diagnosis not present

## 2021-09-23 DIAGNOSIS — M5000 Cervical disc disorder with myelopathy, unspecified cervical region: Secondary | ICD-10-CM | POA: Diagnosis not present

## 2021-09-23 DIAGNOSIS — M1991 Primary osteoarthritis, unspecified site: Secondary | ICD-10-CM | POA: Diagnosis not present

## 2021-09-23 DIAGNOSIS — M47816 Spondylosis without myelopathy or radiculopathy, lumbar region: Secondary | ICD-10-CM | POA: Diagnosis not present

## 2021-09-23 DIAGNOSIS — M150 Primary generalized (osteo)arthritis: Secondary | ICD-10-CM | POA: Diagnosis not present

## 2021-09-23 DIAGNOSIS — E063 Autoimmune thyroiditis: Secondary | ICD-10-CM | POA: Diagnosis not present

## 2021-10-16 ENCOUNTER — Encounter: Payer: Self-pay | Admitting: Internal Medicine

## 2021-10-24 DIAGNOSIS — G4700 Insomnia, unspecified: Secondary | ICD-10-CM | POA: Diagnosis not present

## 2021-10-24 DIAGNOSIS — M47816 Spondylosis without myelopathy or radiculopathy, lumbar region: Secondary | ICD-10-CM | POA: Diagnosis not present

## 2021-10-24 DIAGNOSIS — M159 Polyosteoarthritis, unspecified: Secondary | ICD-10-CM | POA: Diagnosis not present

## 2021-10-24 DIAGNOSIS — Z23 Encounter for immunization: Secondary | ICD-10-CM | POA: Diagnosis not present

## 2021-10-24 DIAGNOSIS — E063 Autoimmune thyroiditis: Secondary | ICD-10-CM | POA: Diagnosis not present

## 2021-10-24 DIAGNOSIS — G894 Chronic pain syndrome: Secondary | ICD-10-CM | POA: Diagnosis not present

## 2021-11-12 ENCOUNTER — Ambulatory Visit (INDEPENDENT_AMBULATORY_CARE_PROVIDER_SITE_OTHER): Payer: Medicare Other

## 2021-11-12 DIAGNOSIS — I495 Sick sinus syndrome: Secondary | ICD-10-CM

## 2021-11-12 LAB — CUP PACEART REMOTE DEVICE CHECK
Battery Remaining Longevity: 132 mo
Battery Remaining Percentage: 95.5 %
Battery Voltage: 3.04 V
Brady Statistic AP VP Percent: 1 %
Brady Statistic AP VS Percent: 2 %
Brady Statistic AS VP Percent: 1 %
Brady Statistic AS VS Percent: 98 %
Brady Statistic RA Percent Paced: 1.9 %
Brady Statistic RV Percent Paced: 1 %
Date Time Interrogation Session: 20221129020012
Implantable Lead Implant Date: 20120802
Implantable Lead Implant Date: 20120802
Implantable Lead Location: 753859
Implantable Lead Location: 753860
Implantable Pulse Generator Implant Date: 20220829
Lead Channel Impedance Value: 450 Ohm
Lead Channel Impedance Value: 450 Ohm
Lead Channel Pacing Threshold Amplitude: 0.5 V
Lead Channel Pacing Threshold Amplitude: 1.25 V
Lead Channel Pacing Threshold Pulse Width: 0.4 ms
Lead Channel Pacing Threshold Pulse Width: 0.4 ms
Lead Channel Sensing Intrinsic Amplitude: 2.3 mV
Lead Channel Sensing Intrinsic Amplitude: 2.5 mV
Lead Channel Setting Pacing Amplitude: 1.5 V
Lead Channel Setting Pacing Amplitude: 2 V
Lead Channel Setting Pacing Pulse Width: 0.4 ms
Lead Channel Setting Sensing Sensitivity: 0.5 mV
Pulse Gen Model: 2272
Pulse Gen Serial Number: 6519548

## 2021-11-15 DIAGNOSIS — G894 Chronic pain syndrome: Secondary | ICD-10-CM | POA: Diagnosis not present

## 2021-11-15 DIAGNOSIS — M159 Polyosteoarthritis, unspecified: Secondary | ICD-10-CM | POA: Diagnosis not present

## 2021-11-15 DIAGNOSIS — E063 Autoimmune thyroiditis: Secondary | ICD-10-CM | POA: Diagnosis not present

## 2021-11-19 ENCOUNTER — Ambulatory Visit: Payer: Medicare Other | Admitting: Internal Medicine

## 2021-11-21 NOTE — Progress Notes (Signed)
Remote pacemaker transmission.   

## 2021-11-25 ENCOUNTER — Encounter: Payer: Self-pay | Admitting: Cardiovascular Disease

## 2021-11-25 ENCOUNTER — Other Ambulatory Visit: Payer: Self-pay

## 2021-11-25 ENCOUNTER — Ambulatory Visit (INDEPENDENT_AMBULATORY_CARE_PROVIDER_SITE_OTHER): Payer: Medicare Other | Admitting: Cardiovascular Disease

## 2021-11-25 VITALS — BP 122/62 | HR 62 | Ht 62.0 in | Wt 143.0 lb

## 2021-11-25 DIAGNOSIS — I2510 Atherosclerotic heart disease of native coronary artery without angina pectoris: Secondary | ICD-10-CM | POA: Diagnosis not present

## 2021-11-25 DIAGNOSIS — I700 Atherosclerosis of aorta: Secondary | ICD-10-CM

## 2021-11-25 DIAGNOSIS — I495 Sick sinus syndrome: Secondary | ICD-10-CM

## 2021-11-25 DIAGNOSIS — I471 Supraventricular tachycardia: Secondary | ICD-10-CM

## 2021-11-25 DIAGNOSIS — Z950 Presence of cardiac pacemaker: Secondary | ICD-10-CM

## 2021-11-25 MED ORDER — PANTOPRAZOLE SODIUM 40 MG PO TBEC: 40.0000 mg | DELAYED_RELEASE_TABLET | Freq: Every day | ORAL | Status: DC | PRN

## 2021-11-25 NOTE — Progress Notes (Signed)
Cardiology Office Note    Date:  11/25/2021   ID:  [name], DOB 03/24/1951, MRN [id number]  PCP:  Redmond School, MD  Cardiologist:   Sanda Klein, MD   Chief complaint: pacemaker generator change follow-up  History of Present Illness:  [name] is a 70 y.o. female with tachycardia-bradycardia syndrome (sinus bradycardia and paroxysmal atrial tachycardia) with a dual-chamber permanent pacemaker (St. Jude w 2088 leads implanted 2012, gen change Assurity MRI August 2022) returning for pacemaker follow-up after generator change.   The site has healed well.  She has no cardiovascular complaints.  She reports issues with pain "in all her joints" including her hands.  The patient specifically denies any chest pain at rest exertion, dyspnea at rest or with exertion, orthopnea, paroxysmal nocturnal dyspnea, syncope, palpitations, focal neurological deficits, intermittent claudication, lower extremity edema, unexplained weight gain, cough, hemoptysis or wheezing.  Device interrogation shows normal function.  Her new pacemaker is a MRI conditional generator and her leads are also MRI conditional.  Is only had 4% atrial pacing and virtually no ventricular pacing.  Estimated generator longevity is over 10 years.  The device has recorded 2 episodes of atrial mode switch both of them paroxysmal atrial tachycardia 1 consisting of 13 beats, the other 24 beats at about 190 bpm.  In the past she has had infrequent episodes of atrial tachycardia.  These are sometimes sustained but the longest we have recorded was about 4 minutes in duration.  She has never had true atrial fibrillation.  She had early surgical menopause after complete hysterectomy.  She wonders whether she should take phytoestrogen supplements for her achy joints and bones.  Past Medical History:  Diagnosis Date   Anxiety    Diverticulosis of colon    Dysrhythmia    AFib   History of kidney stones    Osteoarthritis     Osteoporosis    Paroxysmal atrial fibrillation (HCC)    with SVT response   Presence of permanent cardiac pacemaker    Sinus node dysfunction (HCC)    Tachy-brady syndrome ([place])     Past Surgical History:  Procedure Laterality Date   ABDOMINAL HYSTERECTOMY     APPENDECTOMY     BREAST BIOPSY     right, benign   CERVICAL SPINE SURGERY     two   CHOLECYSTECTOMY     COLONOSCOPY  2007   sigmoid diverticula, few ulcerations of terminal ileum, biopsies of TI unremarkable, random colon bx neg for microscopic colitis.   COLONOSCOPY  06/04/11   Dr. Gala Romney- pegmentation of the rectum, pancolonic diverticula, solitary ulcer in the mouth of the ileocecal valve, distal 15 cm of the terminal ileum appeared normal. Random colon biopsies were benign. Ileocecal valve ulcer or was benign without any supportive evidence of inflammatory bowel disease.   COLONOSCOPY WITH PROPOFOL N/A 04/26/2018   Dr. Gala Romney: diverticulosis, no future screening colonoscopies planned due to age.   ESOPHAGOGASTRODUODENOSCOPY  06/04/11   Dr. Vivi Ferns esophagus, deffuse petechial and gastric submucosal petechiae- benign mucousa on bx. Small bowel biopsy benign. No H. pylori.   ESOPHAGOGASTRODUODENOSCOPY (EGD) WITH PROPOFOL N/A 07/28/2019   Dr. Emerson Monte: Small hiatal hernia, dyspepsia likely due to Fosamax   INSERT / REPLACE / REMOVE PACEMAKER     left hip surgery     in [place], around 2018   [place]  07/2011   [place] N/A 08/12/2021   Procedure: [place];  Surgeon: Sanda Klein,  MD;  Location: [facility];  Service: Cardiovascular;  Laterality: N/A;   s/p hysterectomy     sb capsule study  2009   Two single small nonbleeding AVMs in the  proximal small bowel, single lymphangiectasia in mid small bowel.   vocal cord polypectomy      Current Medications: Outpatient Medications Prior to Visit  Medication Sig Dispense Refill   ALPRAZolam (XANAX) 0.5 MG tablet  Take 0.5 mg by mouth daily as needed for anxiety.     b complex vitamins tablet Take 1 tablet by mouth daily.     CALCIUM-VITAMIN D PO Take 1 tablet by mouth 2 (two) times daily.     Cholecalciferol (VITAMIN D-3) 125 MCG (5000 UT) TABS Take 5,000 Units by mouth daily. Vit K 180 mg     Menthol, Topical Analgesic, (BIOFREEZE COLORLESS) 4 % GEL Apply 1 application topically daily as needed (Pain).     mirtazapine (REMERON) 15 MG tablet Take 30 mg by mouth at bedtime.     Omega-3 Fatty Acids (FISH OIL) 1000 MG CAPS Take 1,000 mg by mouth 2 (two) times daily.     oxyCODONE (ROXICODONE) 15 MG immediate release tablet Take 15 mg by mouth 4 (four) times daily.     pantoprazole (PROTONIX) 40 MG tablet Take 1 tablet (40 mg total) by mouth daily before breakfast. (Patient taking differently: Take 40 mg by mouth daily as needed (Heartburn).) 30 tablet 11   zolpidem (AMBIEN) 10 MG tablet Take 10 mg by mouth at bedtime.     doxycycline (VIBRA-TABS) 100 MG tablet Take 100 mg by mouth 2 (two) times daily. (Patient not taking: Reported on 11/25/2021)     gabapentin (NEURONTIN) 300 MG capsule Take 1 capsule (300 mg total) by mouth 3 (three) times daily. (Patient not taking: Reported on 11/25/2021) 90 capsule 5   No facility-administered medications prior to visit.     Allergies:   Ampicillin and Iodine   Social History   Socioeconomic History   Marital status: Divorced    Spouse name: Not on file   Number of children: Not on file   Years of education: Not on file   Highest education level: Not on file  Occupational History   Occupation: [employer]  Tobacco Use   Smoking status: Former    Types: Cigarettes    Quit date: 12/15/1973    Years since quitting: 47.9   Smokeless tobacco: Never   Tobacco comments:    occas  Vaping Use   Vaping Use: Never used  Substance and Sexual Activity   Alcohol use: No   Drug use: No   Sexual activity: Not on file  Other Topics Concern   Not on file  Social History  Narrative   Has one son   Social Determinants of Health   Financial Resource Strain: Not on file  Food Insecurity: Not on file  Transportation Needs: Not on file  Physical Activity: Not on file  Stress: Not on file  Social Connections: Not on file     Family History:  The patient's family history includes COPD (age of onset: 4) in her mother; Diabetes in her brother; GI problems in her sister; Heart attack (age of onset: 67) in her father; Hypertension in her brother; Stroke (age of onset: 29) in her father.   ROS:   Please see the history of present illness.    ROS All other systems are reviewed and are negative.   PHYSICAL EXAM:   VS:  BP 122/62 (BP Location: Left Arm, Patient Position: Sitting, Cuff Size: Normal)   Pulse 62   Ht 5\' 2"  (1.575 m)   Wt 143 lb (64.9 kg)   SpO2 94%   BMI 26.16 kg/m      General: Alert, oriented x3, no distress, left subclavian pacemaker site is well-healed Head: no evidence of trauma, PERRL, EOMI, no exophtalmos or lid lag, no myxedema, no xanthelasma; normal ears, nose and oropharynx Neck: normal jugular venous pulsations and no hepatojugular reflux; brisk carotid pulses without delay and no carotid bruits Chest: clear to auscultation, no signs of consolidation by percussion or palpation, normal fremitus, symmetrical and full respiratory excursions Cardiovascular: normal position and quality of the apical impulse, regular rhythm, normal first and second heart sounds, no murmurs, rubs or gallops Abdomen: no tenderness or distention, no masses by palpation, no abnormal pulsatility or arterial bruits, normal bowel sounds, no hepatosplenomegaly Extremities: no clubbing, cyanosis or edema; 2+ radial, ulnar and brachial pulses bilaterally; 2+ right femoral, posterior tibial and dorsalis pedis pulses; 2+ left femoral, posterior tibial and dorsalis pedis pulses; no subclavian or femoral bruits Neurological: grossly nonfocal Psych: Normal mood and  affect  Wt Readings from Last 3 Encounters:  11/25/21 143 lb (64.9 kg)  09/02/21 141 lb (64 kg)  08/27/21 141 lb 9.6 oz (64.2 kg)     Studies/Labs Reviewed:   EKG:  EKG is not ordered today.  The most recent ECG shows atrial paced, ventricular sensed rhythm at 55 bpm, otherwise normal tracing.  Intracardiac electrogram today shows atrial sensed, ventricular sensed rhythm  Recent Labs: 07/29/2021: BUN 7; Creatinine, Ser 0.76; Hemoglobin 13.6; Platelets 223; Potassium 5.1; Sodium 140  09/13/2019 potassium 3.7, creatinine 0.8, hemoglobin A1c 7% Lipid Panel    Component Value Date/Time   CHOL 156 07/17/2011 0605   TRIG 107 07/17/2011 0605   HDL 52 07/17/2011 0605   CHOLHDL 3.0 07/17/2011 0605   VLDL 21 07/17/2011 0605   LDLCALC 83 07/17/2011 0605   09/13/2019 total cholesterol 130, HDL 53, LDL 54, triglycerides 255 09/21/2020 Chol 198, HDL 50, TG 121  ASSESSMENT:    1. SSS (sick sinus syndrome) ([place])   2. PAT (paroxysmal atrial tachycardia) ([place])   3. Pacemaker   4. Coronary artery calcification seen on CT scan   5. Atherosclerosis of aorta (HCC)      PLAN:  In order of problems listed above:  SSS: Heart rate histogram is consistent with appropriate chronotropic response. PAT: Although occasionally sustained on past pacemaker interrogation, the episodes remain brief and are infrequent and asymptomatic and do not require specific therapy. PPM: Status post recent generator change, well-healed.  Continue remote downloads every 3 months. Coronary calcification and aortic atherosclerosis: Previously noted on CT from 2018.  No symptoms of angina pectoris or intermittent claudication and no abdominal aortic aneurysm.  Not on any lipid-lowering medication.  LDL cholesterol most recently elevated at 126, substantially higher than it was in 2020 at 54.  Not sure how to explain the rather drastic change in LDL cholesterol.  Has labs planned with her PCP again this year, would prefer LDL  less than 70.  She does not have clinically significant CAD or PAD.   Medication Adjustments/Labs and Tests Ordered: Current medicines are reviewed at length with the patient today.  Concerns regarding medicines are outlined above.  Medication changes, Labs and Tests ordered today are listed in the Patient Instructions below. Patient Instructions  Medication Instructions:  No changes *If you need a refill  on your cardiac medications before your next appointment, please call your pharmacy*   Lab Work: None ordered If you have labs (blood work) drawn today and your tests are completely normal, you will receive your results only by: [place] (if you have MyChart) OR A paper copy in the mail If you have any lab test that is abnormal or we need to change your treatment, we will call you to review the results.   Testing/Procedures: None ordered   Follow-Up: At [facility], you and your health needs are our priority.  As part of our continuing mission to provide you with exceptional heart care, we have created designated Provider Care Teams.  These Care Teams include your primary Cardiologist (physician) and Advanced Practice Providers (APPs -  Physician Assistants and Nurse Practitioners) who all work together to provide you with the care you need, when you need it.  We recommend signing up for the patient portal called "MyChart".  Sign up information is provided on this After Visit Summary.  MyChart is used to connect with patients for Virtual Visits (Telemedicine).  Patients are able to view lab/test results, encounter notes, upcoming appointments, etc.  Non-urgent messages can be sent to your provider as well.   To learn more about what you can do with MyChart, go to [email or web address].    Your next appointment:   12 month(s)  The format for your next appointment:   In Person  Provider:   Sanda Klein, MD      Signed, Sanda Klein, MD  11/25/2021 5:34 PM     [address], [place], [place]  [address] Phone: [phone]; Fax: [phone] ,mg

## 2021-11-25 NOTE — Patient Instructions (Signed)

## 2021-11-26 ENCOUNTER — Ambulatory Visit: Payer: Medicare Other | Admitting: Internal Medicine

## 2021-12-17 DIAGNOSIS — M150 Primary generalized (osteo)arthritis: Secondary | ICD-10-CM | POA: Diagnosis not present

## 2021-12-17 DIAGNOSIS — M159 Polyosteoarthritis, unspecified: Secondary | ICD-10-CM | POA: Diagnosis not present

## 2021-12-17 DIAGNOSIS — G894 Chronic pain syndrome: Secondary | ICD-10-CM | POA: Diagnosis not present

## 2021-12-17 DIAGNOSIS — E063 Autoimmune thyroiditis: Secondary | ICD-10-CM | POA: Diagnosis not present

## 2022-01-17 DIAGNOSIS — R0989 Other specified symptoms and signs involving the circulatory and respiratory systems: Secondary | ICD-10-CM | POA: Diagnosis not present

## 2022-01-17 DIAGNOSIS — E7849 Other hyperlipidemia: Secondary | ICD-10-CM | POA: Diagnosis not present

## 2022-01-17 DIAGNOSIS — M159 Polyosteoarthritis, unspecified: Secondary | ICD-10-CM | POA: Diagnosis not present

## 2022-01-17 DIAGNOSIS — I2510 Atherosclerotic heart disease of native coronary artery without angina pectoris: Secondary | ICD-10-CM | POA: Diagnosis not present

## 2022-01-17 DIAGNOSIS — G894 Chronic pain syndrome: Secondary | ICD-10-CM | POA: Diagnosis not present

## 2022-01-17 DIAGNOSIS — I4891 Unspecified atrial fibrillation: Secondary | ICD-10-CM | POA: Diagnosis not present

## 2022-01-17 DIAGNOSIS — E559 Vitamin D deficiency, unspecified: Secondary | ICD-10-CM | POA: Diagnosis not present

## 2022-01-17 DIAGNOSIS — E063 Autoimmune thyroiditis: Secondary | ICD-10-CM | POA: Diagnosis not present

## 2022-01-17 DIAGNOSIS — E039 Hypothyroidism, unspecified: Secondary | ICD-10-CM | POA: Diagnosis not present

## 2022-01-17 DIAGNOSIS — Z0001 Encounter for general adult medical examination with abnormal findings: Secondary | ICD-10-CM | POA: Diagnosis not present

## 2022-01-20 ENCOUNTER — Other Ambulatory Visit (HOSPITAL_COMMUNITY): Payer: Self-pay | Admitting: Internal Medicine

## 2022-01-20 ENCOUNTER — Other Ambulatory Visit: Payer: Self-pay | Admitting: Internal Medicine

## 2022-01-20 DIAGNOSIS — R0989 Other specified symptoms and signs involving the circulatory and respiratory systems: Secondary | ICD-10-CM

## 2022-01-27 ENCOUNTER — Ambulatory Visit (HOSPITAL_COMMUNITY)
Admission: RE | Admit: 2022-01-27 | Discharge: 2022-01-27 | Disposition: A | Discharge status: Home / Self Care | Payer: Medicare Other | Source: Ambulatory Visit | Attending: Internal Medicine | Admitting: Internal Medicine

## 2022-01-27 ENCOUNTER — Other Ambulatory Visit: Payer: Self-pay

## 2022-01-27 DIAGNOSIS — R0989 Other specified symptoms and signs involving the circulatory and respiratory systems: Secondary | ICD-10-CM | POA: Insufficient documentation

## 2022-01-27 DIAGNOSIS — I6523 Occlusion and stenosis of bilateral carotid arteries: Secondary | ICD-10-CM | POA: Diagnosis not present

## 2022-02-11 ENCOUNTER — Ambulatory Visit (INDEPENDENT_AMBULATORY_CARE_PROVIDER_SITE_OTHER): Payer: Medicare Other

## 2022-02-11 DIAGNOSIS — I495 Sick sinus syndrome: Secondary | ICD-10-CM | POA: Diagnosis not present

## 2022-02-12 LAB — CUP PACEART REMOTE DEVICE CHECK
Battery Remaining Longevity: 127 mo
Battery Remaining Percentage: 95.5 %
Battery Voltage: 3.04 V
Brady Statistic AP VP Percent: 1 %
Brady Statistic AP VS Percent: 16 %
Brady Statistic AS VP Percent: 1 %
Brady Statistic AS VS Percent: 83 %
Brady Statistic RA Percent Paced: 16 %
Brady Statistic RV Percent Paced: 1 %
Date Time Interrogation Session: 20230301111637
Implantable Lead Implant Date: 20120802
Implantable Lead Implant Date: 20120802
Implantable Lead Location: 753859
Implantable Lead Location: 753860
Implantable Pulse Generator Implant Date: 20220829
Lead Channel Impedance Value: 430 Ohm
Lead Channel Impedance Value: 450 Ohm
Lead Channel Pacing Threshold Amplitude: 0.5 V
Lead Channel Pacing Threshold Amplitude: 1.125 V
Lead Channel Pacing Threshold Pulse Width: 0.4 ms
Lead Channel Pacing Threshold Pulse Width: 0.4 ms
Lead Channel Sensing Intrinsic Amplitude: 1.4 mV
Lead Channel Sensing Intrinsic Amplitude: 2.5 mV
Lead Channel Setting Pacing Amplitude: 1.375
Lead Channel Setting Pacing Amplitude: 2 V
Lead Channel Setting Pacing Pulse Width: 0.4 ms
Lead Channel Setting Sensing Sensitivity: 0.5 mV
Pulse Gen Model: 2272
Pulse Gen Serial Number: 6519548

## 2022-02-17 DIAGNOSIS — G894 Chronic pain syndrome: Secondary | ICD-10-CM | POA: Diagnosis not present

## 2022-02-17 DIAGNOSIS — M47816 Spondylosis without myelopathy or radiculopathy, lumbar region: Secondary | ICD-10-CM | POA: Diagnosis not present

## 2022-02-17 DIAGNOSIS — R0989 Other specified symptoms and signs involving the circulatory and respiratory systems: Secondary | ICD-10-CM | POA: Diagnosis not present

## 2022-02-17 DIAGNOSIS — E063 Autoimmune thyroiditis: Secondary | ICD-10-CM | POA: Diagnosis not present

## 2022-02-17 DIAGNOSIS — M5000 Cervical disc disorder with myelopathy, unspecified cervical region: Secondary | ICD-10-CM | POA: Diagnosis not present

## 2022-02-18 ENCOUNTER — Ambulatory Visit: Payer: Medicare Other | Admitting: Gastroenterology

## 2022-02-19 NOTE — Progress Notes (Signed)
Remote pacemaker transmission.   

## 2022-03-17 DIAGNOSIS — L570 Actinic keratosis: Secondary | ICD-10-CM | POA: Diagnosis not present

## 2022-03-17 DIAGNOSIS — D485 Neoplasm of uncertain behavior of skin: Secondary | ICD-10-CM | POA: Diagnosis not present

## 2022-03-20 DIAGNOSIS — H6011 Cellulitis of right external ear: Secondary | ICD-10-CM | POA: Diagnosis not present

## 2022-03-20 DIAGNOSIS — K219 Gastro-esophageal reflux disease without esophagitis: Secondary | ICD-10-CM | POA: Diagnosis not present

## 2022-03-20 DIAGNOSIS — G894 Chronic pain syndrome: Secondary | ICD-10-CM | POA: Diagnosis not present

## 2022-03-20 DIAGNOSIS — M150 Primary generalized (osteo)arthritis: Secondary | ICD-10-CM | POA: Diagnosis not present

## 2022-03-20 DIAGNOSIS — G4700 Insomnia, unspecified: Secondary | ICD-10-CM | POA: Diagnosis not present

## 2022-03-20 DIAGNOSIS — E063 Autoimmune thyroiditis: Secondary | ICD-10-CM | POA: Diagnosis not present

## 2022-04-21 DIAGNOSIS — E063 Autoimmune thyroiditis: Secondary | ICD-10-CM | POA: Diagnosis not present

## 2022-04-21 DIAGNOSIS — M150 Primary generalized (osteo)arthritis: Secondary | ICD-10-CM | POA: Diagnosis not present

## 2022-04-21 DIAGNOSIS — E559 Vitamin D deficiency, unspecified: Secondary | ICD-10-CM | POA: Diagnosis not present

## 2022-04-21 DIAGNOSIS — G894 Chronic pain syndrome: Secondary | ICD-10-CM | POA: Diagnosis not present

## 2022-04-21 DIAGNOSIS — M5000 Cervical disc disorder with myelopathy, unspecified cervical region: Secondary | ICD-10-CM | POA: Diagnosis not present

## 2022-05-13 ENCOUNTER — Ambulatory Visit (INDEPENDENT_AMBULATORY_CARE_PROVIDER_SITE_OTHER): Payer: Medicare Other

## 2022-05-13 DIAGNOSIS — I495 Sick sinus syndrome: Secondary | ICD-10-CM

## 2022-05-13 LAB — CUP PACEART REMOTE DEVICE CHECK
Battery Remaining Longevity: 126 mo
Battery Remaining Percentage: 95.5 %
Battery Voltage: 3.02 V
Brady Statistic AP VP Percent: 1 %
Brady Statistic AP VS Percent: 12 %
Brady Statistic AS VP Percent: 1 %
Brady Statistic AS VS Percent: 88 %
Brady Statistic RA Percent Paced: 12 %
Brady Statistic RV Percent Paced: 1 %
Date Time Interrogation Session: 20230530020013
Implantable Lead Implant Date: 20120802
Implantable Lead Implant Date: 20120802
Implantable Lead Location: 753859
Implantable Lead Location: 753860
Implantable Pulse Generator Implant Date: 20220829
Lead Channel Impedance Value: 460 Ohm
Lead Channel Impedance Value: 480 Ohm
Lead Channel Pacing Threshold Amplitude: 0.5 V
Lead Channel Pacing Threshold Amplitude: 1.125 V
Lead Channel Pacing Threshold Pulse Width: 0.4 ms
Lead Channel Pacing Threshold Pulse Width: 0.4 ms
Lead Channel Sensing Intrinsic Amplitude: 3.1 mV
Lead Channel Sensing Intrinsic Amplitude: 3.7 mV
Lead Channel Setting Pacing Amplitude: 1.375
Lead Channel Setting Pacing Amplitude: 2 V
Lead Channel Setting Pacing Pulse Width: 0.4 ms
Lead Channel Setting Sensing Sensitivity: 0.5 mV
Pulse Gen Model: 2272
Pulse Gen Serial Number: 6519548

## 2022-05-14 DIAGNOSIS — E782 Mixed hyperlipidemia: Secondary | ICD-10-CM | POA: Diagnosis not present

## 2022-05-14 DIAGNOSIS — M810 Age-related osteoporosis without current pathological fracture: Secondary | ICD-10-CM | POA: Diagnosis not present

## 2022-05-14 DIAGNOSIS — G894 Chronic pain syndrome: Secondary | ICD-10-CM | POA: Diagnosis not present

## 2022-05-21 DIAGNOSIS — L570 Actinic keratosis: Secondary | ICD-10-CM | POA: Diagnosis not present

## 2022-05-21 DIAGNOSIS — Z85828 Personal history of other malignant neoplasm of skin: Secondary | ICD-10-CM | POA: Diagnosis not present

## 2022-05-22 DIAGNOSIS — G894 Chronic pain syndrome: Secondary | ICD-10-CM | POA: Diagnosis not present

## 2022-05-22 DIAGNOSIS — E559 Vitamin D deficiency, unspecified: Secondary | ICD-10-CM | POA: Diagnosis not present

## 2022-05-22 DIAGNOSIS — I4891 Unspecified atrial fibrillation: Secondary | ICD-10-CM | POA: Diagnosis not present

## 2022-05-22 DIAGNOSIS — M159 Polyosteoarthritis, unspecified: Secondary | ICD-10-CM | POA: Diagnosis not present

## 2022-05-22 DIAGNOSIS — I700 Atherosclerosis of aorta: Secondary | ICD-10-CM | POA: Diagnosis not present

## 2022-05-22 DIAGNOSIS — E063 Autoimmune thyroiditis: Secondary | ICD-10-CM | POA: Diagnosis not present

## 2022-05-27 NOTE — Progress Notes (Signed)
Remote pacemaker transmission.   

## 2022-06-20 DIAGNOSIS — E063 Autoimmune thyroiditis: Secondary | ICD-10-CM | POA: Diagnosis not present

## 2022-06-20 DIAGNOSIS — M159 Polyosteoarthritis, unspecified: Secondary | ICD-10-CM | POA: Diagnosis not present

## 2022-06-20 DIAGNOSIS — G894 Chronic pain syndrome: Secondary | ICD-10-CM | POA: Diagnosis not present

## 2022-06-20 DIAGNOSIS — I700 Atherosclerosis of aorta: Secondary | ICD-10-CM | POA: Diagnosis not present

## 2022-06-20 DIAGNOSIS — M5000 Cervical disc disorder with myelopathy, unspecified cervical region: Secondary | ICD-10-CM | POA: Diagnosis not present

## 2022-07-21 DIAGNOSIS — M159 Polyosteoarthritis, unspecified: Secondary | ICD-10-CM | POA: Diagnosis not present

## 2022-07-21 DIAGNOSIS — M5000 Cervical disc disorder with myelopathy, unspecified cervical region: Secondary | ICD-10-CM | POA: Diagnosis not present

## 2022-07-21 DIAGNOSIS — M47816 Spondylosis without myelopathy or radiculopathy, lumbar region: Secondary | ICD-10-CM | POA: Diagnosis not present

## 2022-07-21 DIAGNOSIS — G894 Chronic pain syndrome: Secondary | ICD-10-CM | POA: Diagnosis not present

## 2022-07-21 DIAGNOSIS — M810 Age-related osteoporosis without current pathological fracture: Secondary | ICD-10-CM | POA: Diagnosis not present

## 2022-07-21 DIAGNOSIS — I700 Atherosclerosis of aorta: Secondary | ICD-10-CM | POA: Diagnosis not present

## 2022-08-12 ENCOUNTER — Ambulatory Visit (INDEPENDENT_AMBULATORY_CARE_PROVIDER_SITE_OTHER): Payer: Medicare Other

## 2022-08-12 DIAGNOSIS — I495 Sick sinus syndrome: Secondary | ICD-10-CM | POA: Diagnosis not present

## 2022-08-12 LAB — CUP PACEART REMOTE DEVICE CHECK
Battery Remaining Longevity: 122 mo
Battery Remaining Percentage: 94 %
Battery Voltage: 3.02 V
Brady Statistic AP VP Percent: 1 %
Brady Statistic AP VS Percent: 10 %
Brady Statistic AS VP Percent: 1 %
Brady Statistic AS VS Percent: 90 %
Brady Statistic RA Percent Paced: 9.9 %
Brady Statistic RV Percent Paced: 1 %
Date Time Interrogation Session: 20230829023850
Implantable Lead Implant Date: 20120802
Implantable Lead Implant Date: 20120802
Implantable Lead Location: 753859
Implantable Lead Location: 753860
Implantable Pulse Generator Implant Date: 20220829
Lead Channel Impedance Value: 450 Ohm
Lead Channel Impedance Value: 450 Ohm
Lead Channel Pacing Threshold Amplitude: 0.5 V
Lead Channel Pacing Threshold Amplitude: 1.125 V
Lead Channel Pacing Threshold Pulse Width: 0.4 ms
Lead Channel Pacing Threshold Pulse Width: 0.4 ms
Lead Channel Sensing Intrinsic Amplitude: 2.9 mV
Lead Channel Sensing Intrinsic Amplitude: 3.4 mV
Lead Channel Setting Pacing Amplitude: 1.375
Lead Channel Setting Pacing Amplitude: 2 V
Lead Channel Setting Pacing Pulse Width: 0.4 ms
Lead Channel Setting Sensing Sensitivity: 0.5 mV
Pulse Gen Model: 2272
Pulse Gen Serial Number: 6519548

## 2022-08-19 DIAGNOSIS — G894 Chronic pain syndrome: Secondary | ICD-10-CM | POA: Diagnosis not present

## 2022-08-19 DIAGNOSIS — M159 Polyosteoarthritis, unspecified: Secondary | ICD-10-CM | POA: Diagnosis not present

## 2022-08-19 DIAGNOSIS — E063 Autoimmune thyroiditis: Secondary | ICD-10-CM | POA: Diagnosis not present

## 2022-08-19 DIAGNOSIS — M47816 Spondylosis without myelopathy or radiculopathy, lumbar region: Secondary | ICD-10-CM | POA: Diagnosis not present

## 2022-08-19 DIAGNOSIS — M5000 Cervical disc disorder with myelopathy, unspecified cervical region: Secondary | ICD-10-CM | POA: Diagnosis not present

## 2022-08-19 DIAGNOSIS — I700 Atherosclerosis of aorta: Secondary | ICD-10-CM | POA: Diagnosis not present

## 2022-08-19 DIAGNOSIS — M810 Age-related osteoporosis without current pathological fracture: Secondary | ICD-10-CM | POA: Diagnosis not present

## 2022-09-03 ENCOUNTER — Telehealth: Payer: Self-pay | Admitting: Cardiovascular Disease

## 2022-09-03 ENCOUNTER — Encounter: Payer: Self-pay | Admitting: Cardiology

## 2022-09-03 DIAGNOSIS — M7989 Other specified soft tissue disorders: Secondary | ICD-10-CM | POA: Insufficient documentation

## 2022-09-03 DIAGNOSIS — R609 Edema, unspecified: Secondary | ICD-10-CM | POA: Insufficient documentation

## 2022-09-03 DIAGNOSIS — I480 Paroxysmal atrial fibrillation: Secondary | ICD-10-CM | POA: Insufficient documentation

## 2022-09-03 NOTE — Telephone Encounter (Signed)
Returned call to patient-patient reports concerns with LE edema and weight increase over the last several weeks.   Reports weight is currently 150 lbs, usually 143-144.   She reports LE edema, L>R, reports legs feel tight as well as her abdomen.   She is able to lie flat at night, no SOB but reports significant fatigue/lack of energy.   Reports no change in eating habits, no long car rides/trips.  Reports swelling does not decrease with elevation at night.  She also reports concerns with multiple episodes of chest pain.  Reports pain is sharp, stabbing pain.  Reports this has occurred multiple times-unsure if related to exertion or not.  She is concerned and would like to be evaluated.    Per chart review:  last OV 11/2021 with Dr. Sallyanne Kuster.  Hx: SSS, PAT, PPM, coronary calcifications on CT.    Scheduled appt tomorrow with Dr. Percival Spanish for further assessment.  Patient aware and verbalized.

## 2022-09-03 NOTE — Progress Notes (Unsigned)
Cardiology Office Note   Date:  09/03/2022   ID:  [name], DOB 1951-04-19, MRN [id number]  PCP:  Redmond School, MD  Cardiologist:   Sanda Klein, MD Referring:  ***  No chief complaint on file.     History of Present Illness: [name] is a 71 y.o. female who presents for evaluation of tachybrady syndrome.  She was called and added to my schedule because of increased lower extremity edema.  ***     She is status post pacemaker and she has a history of atrial fib.    Past Medical History:  Diagnosis Date   Anxiety    Diverticulosis of colon    Dysrhythmia    AFib   History of kidney stones    Osteoarthritis    Osteoporosis    Paroxysmal atrial fibrillation (HCC)    with SVT response   Presence of permanent cardiac pacemaker    Sinus node dysfunction (HCC)    Tachy-brady syndrome ([place])     Past Surgical History:  Procedure Laterality Date   ABDOMINAL HYSTERECTOMY     APPENDECTOMY     BREAST BIOPSY     right, benign   CERVICAL SPINE SURGERY     two   CHOLECYSTECTOMY     COLONOSCOPY  2007   sigmoid diverticula, few ulcerations of terminal ileum, biopsies of TI unremarkable, random colon bx neg for microscopic colitis.   COLONOSCOPY  06/04/11   Dr. Gala Romney- pegmentation of the rectum, pancolonic diverticula, solitary ulcer in the mouth of the ileocecal valve, distal 15 cm of the terminal ileum appeared normal. Random colon biopsies were benign. Ileocecal valve ulcer or was benign without any supportive evidence of inflammatory bowel disease.   COLONOSCOPY WITH PROPOFOL N/A 04/26/2018   Dr. Gala Romney: diverticulosis, no future screening colonoscopies planned due to age.   ESOPHAGOGASTRODUODENOSCOPY  06/04/11   Dr. Vivi Ferns esophagus, deffuse petechial and gastric submucosal petechiae- benign mucousa on bx. Small bowel biopsy benign. No H. pylori.   ESOPHAGOGASTRODUODENOSCOPY (EGD) WITH PROPOFOL N/A 07/28/2019   Dr. Emerson Monte: Small hiatal hernia, dyspepsia  likely due to Fosamax   INSERT / REPLACE / REMOVE PACEMAKER     left hip surgery     in [place], around 2018   [place]  07/2011   PPM GENERATOR CHANGEOUT N/A 08/12/2021   Procedure: [place];  Surgeon: Sanda Klein, MD;  Location: [facility];  Service: Cardiovascular;  Laterality: N/A;   s/p hysterectomy     sb capsule study  2009   Two single small nonbleeding AVMs in the  proximal small bowel, single lymphangiectasia in mid small bowel.   vocal cord polypectomy       Current Outpatient Medications  Medication Sig Dispense Refill   ALPRAZolam (XANAX) 0.5 MG tablet Take 0.5 mg by mouth daily as needed for anxiety.     b complex vitamins tablet Take 1 tablet by mouth daily.     CALCIUM-VITAMIN D PO Take 1 tablet by mouth 2 (two) times daily.     Cholecalciferol (VITAMIN D-3) 125 MCG (5000 UT) TABS Take 5,000 Units by mouth daily. Vit K 180 mg     Menthol, Topical Analgesic, (BIOFREEZE COLORLESS) 4 % GEL Apply 1 application topically daily as needed (Pain).     mirtazapine (REMERON) 15 MG tablet Take 30 mg by mouth at bedtime.     Omega-3 Fatty Acids (FISH OIL) 1000 MG CAPS Take 1,000 mg by mouth 2 (two)  times daily.     oxyCODONE (ROXICODONE) 15 MG immediate release tablet Take 15 mg by mouth 4 (four) times daily.     pantoprazole (PROTONIX) 40 MG tablet Take 1 tablet (40 mg total) by mouth daily as needed (Heartburn).     zolpidem (AMBIEN) 10 MG tablet Take 10 mg by mouth at bedtime.     No current facility-administered medications for this visit.    Allergies:   Ampicillin and Iodine     ROS:  Please see the history of present illness.   Otherwise, review of systems are positive for {NONE DEFAULTED:18576}.   All other systems are reviewed and negative.    PHYSICAL EXAM: VS:  There were no vitals taken for this visit. , BMI There is no height or weight on file to calculate BMI. GENERAL:  Well appearing HEENT:  Pupils equal round  and reactive, fundi not visualized, oral mucosa unremarkable NECK:  No jugular venous distention, waveform within normal limits, carotid upstroke brisk and symmetric, no bruits, no thyromegaly LYMPHATICS:  No cervical, inguinal adenopathy LUNGS:  Clear to auscultation bilaterally BACK:  No CVA tenderness CHEST:  Unremarkable HEART:  PMI not displaced or sustained,S1 and S2 within normal limits, no S3, no S4, no clicks, no rubs, *** murmurs ABD:  Flat, positive bowel sounds normal in frequency in pitch, no bruits, no rebound, no guarding, no midline pulsatile mass, no hepatomegaly, no splenomegaly EXT:  2 plus pulses throughout, no edema, no cyanosis no clubbing SKIN:  No rashes no nodules NEURO:  Cranial nerves II through XII grossly intact, motor grossly intact throughout PSYCH:  Cognitively intact, oriented to person place and time    EKG:  EKG {ACTION; IS/IS GNF:62130865} ordered today. The ekg ordered today demonstrates ***   Recent Labs: No results found for requested labs within last 365 days.    Lipid Panel    Component Value Date/Time   CHOL 156 07/17/2011 0605   TRIG 107 07/17/2011 0605   HDL 52 07/17/2011 0605   CHOLHDL 3.0 07/17/2011 0605   VLDL 21 07/17/2011 0605   LDLCALC 83 07/17/2011 0605      Wt Readings from Last 3 Encounters:  11/25/21 143 lb (64.9 kg)  09/02/21 141 lb (64 kg)  08/27/21 141 lb 9.6 oz (64.2 kg)      Other studies Reviewed: Additional studies/ records that were reviewed today include: ***. Review of the above records demonstrates:  Please see elsewhere in the note.  ***   ASSESSMENT AND PLAN:  Edema:  ***    Tachybrady syndrome:  ***  PAF:  ***   Current medicines are reviewed at length with the patient today.  The patient {ACTIONS; HAS/DOES NOT HAVE:19233} concerns regarding medicines.  The following changes have been made:  {PLAN; NO CHANGE:13088:s}  Labs/ tests ordered today include: *** No orders of the defined types  were placed in this encounter.    Disposition:   FU with ***    Signed, Minus Breeding, MD  09/03/2022 9:08 PM    [place]

## 2022-09-03 NOTE — Telephone Encounter (Signed)
Pt c/o swelling: STAT is pt has developed SOB within 24 hours  If swelling, where is the swelling located? LE  How much weight have you gained and in what time span?  7 lbs  Have you gained 3 pounds in a day or 5 pounds in a week? Not sure   Do you have a log of your daily weights (if so, list)? No   Are you currently taking a fluid pill? No   Are you currently SOB? No   Have you traveled recently? No

## 2022-09-04 ENCOUNTER — Ambulatory Visit: Payer: Medicare Other | Attending: Cardiology | Admitting: Cardiology

## 2022-09-04 ENCOUNTER — Encounter: Payer: Self-pay | Admitting: Cardiology

## 2022-09-04 VITALS — BP 144/72 | HR 65 | Ht 62.0 in | Wt 151.2 lb

## 2022-09-04 DIAGNOSIS — I495 Sick sinus syndrome: Secondary | ICD-10-CM

## 2022-09-04 DIAGNOSIS — I480 Paroxysmal atrial fibrillation: Secondary | ICD-10-CM

## 2022-09-04 DIAGNOSIS — R609 Edema, unspecified: Secondary | ICD-10-CM

## 2022-09-04 DIAGNOSIS — M7989 Other specified soft tissue disorders: Secondary | ICD-10-CM | POA: Diagnosis not present

## 2022-09-04 DIAGNOSIS — R072 Precordial pain: Secondary | ICD-10-CM | POA: Diagnosis not present

## 2022-09-04 DIAGNOSIS — R600 Localized edema: Secondary | ICD-10-CM | POA: Diagnosis not present

## 2022-09-04 NOTE — Patient Instructions (Signed)
Medication Instructions:  No Changes In Medications at this time.  *If you need a refill on your cardiac medications before your next appointment, please call your pharmacy*  Lab Work: BLOOD WORK TODAY If you have labs (blood work) drawn today and your tests are completely normal, you will receive your results only by: [place] (if you have MyChart) OR A paper copy in the mail If you have any lab test that is abnormal or we need to change your treatment, we will call you to review the results.  Testing/Procedures: Your physician has requested that you have a lexiscan myoview. For further information please visit [email or web address]. Please follow instruction sheet, as given. THIS WILL TAKE PLACE AT [place]: At [facility], you and your health needs are our priority.  As part of our continuing mission to provide you with exceptional heart care, we have created designated Provider Care Teams.  These Care Teams include your primary Cardiologist (physician) and Advanced Practice Providers (APPs -  Physician Assistants and Nurse Practitioners) who all work together to provide you with the care you need, when you need it.  We recommend signing up for the patient portal called "MyChart".  Sign up information is provided on this After Visit Summary.  MyChart is used to connect with patients for Virtual Visits (Telemedicine).  Patients are able to view lab/test results, encounter notes, upcoming appointments, etc.  Non-urgent messages can be sent to your provider as well.   To learn more about what you can do with MyChart, go to [email or web address].    Your next appointment:   3 month(s)  The format for your next appointment:   In Person  Provider:   Sanda Klein, MD

## 2022-09-05 LAB — COMPREHENSIVE METABOLIC PANEL
ALT: 12 IU/L (ref 0–32)
AST: 15 IU/L (ref 0–40)
Albumin/Globulin Ratio: 1.7 (ref 1.2–2.2)
Albumin: 4.1 g/dL (ref 3.8–4.8)
Alkaline Phosphatase: 90 IU/L (ref 44–121)
BUN/Creatinine Ratio: 7 — ABNORMAL LOW (ref 12–28)
BUN: 5 mg/dL — ABNORMAL LOW (ref 8–27)
Bilirubin Total: 0.3 mg/dL (ref 0.0–1.2)
CO2: 28 mmol/L (ref 20–29)
Calcium: 8.9 mg/dL (ref 8.7–10.3)
Chloride: 96 mmol/L (ref 96–106)
Creatinine, Ser: 0.71 mg/dL (ref 0.57–1.00)
Globulin, Total: 2.4 g/dL (ref 1.5–4.5)
Glucose: 78 mg/dL (ref 70–99)
Potassium: 4.3 mmol/L (ref 3.5–5.2)
Sodium: 136 mmol/L (ref 134–144)
Total Protein: 6.5 g/dL (ref 6.0–8.5)
eGFR: 90 mL/min/{1.73_m2} (ref >59)

## 2022-09-05 LAB — CBC
Hematocrit: 35.5 % (ref 34.0–46.6)
Hemoglobin: 12.2 g/dL (ref 11.1–15.9)
MCH: 32 pg (ref 26.6–33.0)
MCHC: 34.4 g/dL (ref 31.5–35.7)
MCV: 93 fL (ref 79–97)
Platelets: 216 10*3/uL (ref 150–450)
RBC: 3.81 x10E6/uL (ref 3.77–5.28)
RDW: 11.9 % (ref 11.7–15.4)
WBC: 8 10*3/uL (ref 3.4–10.8)

## 2022-09-05 LAB — TSH: TSH: 3.61 u[IU]/mL (ref 0.450–4.500)

## 2022-09-05 LAB — BRAIN NATRIURETIC PEPTIDE: BNP: 58.8 pg/mL (ref 0.0–100.0)

## 2022-09-05 NOTE — Progress Notes (Signed)
Remote pacemaker transmission.   

## 2022-09-08 ENCOUNTER — Encounter: Payer: Self-pay | Admitting: *Deleted

## 2022-09-11 ENCOUNTER — Telehealth: Payer: Self-pay | Admitting: Cardiology

## 2022-09-11 NOTE — Telephone Encounter (Signed)
Patient made aware of the lab results. Verbalized understanding. No questions or concerns expressed at this time.

## 2022-09-11 NOTE — Telephone Encounter (Signed)
    Patient calling for lab results 

## 2022-09-16 DIAGNOSIS — M810 Age-related osteoporosis without current pathological fracture: Secondary | ICD-10-CM | POA: Diagnosis not present

## 2022-09-16 DIAGNOSIS — G894 Chronic pain syndrome: Secondary | ICD-10-CM | POA: Diagnosis not present

## 2022-09-16 DIAGNOSIS — M5000 Cervical disc disorder with myelopathy, unspecified cervical region: Secondary | ICD-10-CM | POA: Diagnosis not present

## 2022-09-16 DIAGNOSIS — M159 Polyosteoarthritis, unspecified: Secondary | ICD-10-CM | POA: Diagnosis not present

## 2022-09-16 DIAGNOSIS — I700 Atherosclerosis of aorta: Secondary | ICD-10-CM | POA: Diagnosis not present

## 2022-09-16 DIAGNOSIS — M47816 Spondylosis without myelopathy or radiculopathy, lumbar region: Secondary | ICD-10-CM | POA: Diagnosis not present

## 2022-09-19 ENCOUNTER — Other Ambulatory Visit (HOSPITAL_COMMUNITY): Payer: Medicare Other

## 2022-09-19 ENCOUNTER — Encounter (HOSPITAL_COMMUNITY): Payer: Medicare Other

## 2022-09-22 ENCOUNTER — Encounter (HOSPITAL_COMMUNITY)
Admission: RE | Admit: 2022-09-22 | Discharge: 2022-09-22 | Disposition: A | Discharge status: Home / Self Care | Payer: Medicare Other | Source: Ambulatory Visit | Attending: Cardiology | Admitting: Cardiology

## 2022-09-22 ENCOUNTER — Encounter (HOSPITAL_COMMUNITY): Payer: Self-pay

## 2022-09-22 ENCOUNTER — Encounter (HOSPITAL_COMMUNITY): Payer: Medicare Other

## 2022-09-22 DIAGNOSIS — R072 Precordial pain: Secondary | ICD-10-CM | POA: Insufficient documentation

## 2022-09-24 ENCOUNTER — Encounter (HOSPITAL_COMMUNITY)
Admission: RE | Admit: 2022-09-24 | Discharge: 2022-09-24 | Disposition: A | Discharge status: Home / Self Care | Payer: Medicare Other | Source: Ambulatory Visit | Attending: Cardiology | Admitting: Cardiology

## 2022-09-24 ENCOUNTER — Encounter (HOSPITAL_COMMUNITY): Payer: Self-pay

## 2022-09-24 ENCOUNTER — Ambulatory Visit (HOSPITAL_COMMUNITY)
Admission: RE | Admit: 2022-09-24 | Discharge: 2022-09-24 | Disposition: A | Discharge status: Home / Self Care | Payer: Medicare Other | Source: Ambulatory Visit | Attending: Cardiovascular Disease | Admitting: Cardiovascular Disease

## 2022-09-24 DIAGNOSIS — R072 Precordial pain: Secondary | ICD-10-CM | POA: Insufficient documentation

## 2022-09-24 LAB — NM MYOCAR MULTI W/SPECT W/WALL MOTION / EF
LV dias vol: 52 mL (ref 46–106)
LV sys vol: 9 mL
Nuc Stress EF: 83 %
Peak HR: 77 {beats}/min
RATE: 0.4
Rest HR: 62 {beats}/min
Rest Nuclear Isotope Dose: 10.2 mCi
SDS: 1
SRS: 8
SSS: 9
ST Depression (mm): 0 mm
Stress Nuclear Isotope Dose: 32 mCi
TID: 1.01

## 2022-09-24 MED ORDER — REGADENOSON 0.4 MG/5ML IV SOLN
INTRAVENOUS | Status: AC
  Administered 2022-09-24: 0.4 mg
  Filled 2022-09-24: qty 5

## 2022-09-24 MED ORDER — TECHNETIUM TC 99M TETROFOSMIN IV KIT
10.0000 | PACK | Freq: Once | INTRAVENOUS | Status: AC | PRN
  Administered 2022-09-24: 10.2 via INTRAVENOUS

## 2022-09-24 MED ORDER — SODIUM CHLORIDE FLUSH 0.9 % IV SOLN
INTRAVENOUS | Status: AC
  Administered 2022-09-24: 10 mL
  Filled 2022-09-24: qty 10

## 2022-09-24 MED ORDER — TECHNETIUM TC 99M TETROFOSMIN IV KIT
30.0000 | PACK | Freq: Once | INTRAVENOUS | Status: AC | PRN
  Administered 2022-09-24: 32 via INTRAVENOUS

## 2022-10-06 ENCOUNTER — Telehealth: Payer: Self-pay | Admitting: Cardiovascular Disease

## 2022-10-06 NOTE — Telephone Encounter (Signed)
Pt calling for NM  myocardial test results. Informed her Dr. Loletha Grayer has not reviewed yet. Once he has, RN will call to give results.

## 2022-10-07 NOTE — Telephone Encounter (Signed)
Patient made aware of results and verbalized understanding.  Minus Breeding, MD  You 14 hours ago (6:42 PM)    There was no evidence of ischemia.  I am not sure why this did not come to my in basket.  No further work up.  Follow with Dr. Loletha Grayer.

## 2022-10-16 DIAGNOSIS — M5000 Cervical disc disorder with myelopathy, unspecified cervical region: Secondary | ICD-10-CM | POA: Diagnosis not present

## 2022-10-16 DIAGNOSIS — G894 Chronic pain syndrome: Secondary | ICD-10-CM | POA: Diagnosis not present

## 2022-10-16 DIAGNOSIS — M159 Polyosteoarthritis, unspecified: Secondary | ICD-10-CM | POA: Diagnosis not present

## 2022-10-16 DIAGNOSIS — M810 Age-related osteoporosis without current pathological fracture: Secondary | ICD-10-CM | POA: Diagnosis not present

## 2022-11-11 ENCOUNTER — Ambulatory Visit (INDEPENDENT_AMBULATORY_CARE_PROVIDER_SITE_OTHER): Payer: Medicare Other

## 2022-11-11 DIAGNOSIS — I495 Sick sinus syndrome: Secondary | ICD-10-CM | POA: Diagnosis not present

## 2022-11-11 LAB — CUP PACEART REMOTE DEVICE CHECK
Battery Remaining Longevity: 121 mo
Battery Remaining Percentage: 92 %
Battery Voltage: 3.02 V
Brady Statistic AP VP Percent: 1 %
Brady Statistic AP VS Percent: 9.3 %
Brady Statistic AS VP Percent: 1 %
Brady Statistic AS VS Percent: 91 %
Brady Statistic RA Percent Paced: 8.9 %
Brady Statistic RV Percent Paced: 1 %
Date Time Interrogation Session: 20231128020012
Implantable Lead Connection Status: 753985
Implantable Lead Connection Status: 753985
Implantable Lead Implant Date: 20120802
Implantable Lead Implant Date: 20120802
Implantable Lead Location: 753859
Implantable Lead Location: 753860
Implantable Pulse Generator Implant Date: 20220829
Lead Channel Impedance Value: 450 Ohm
Lead Channel Impedance Value: 460 Ohm
Lead Channel Pacing Threshold Amplitude: 0.5 V
Lead Channel Pacing Threshold Amplitude: 1 V
Lead Channel Pacing Threshold Pulse Width: 0.4 ms
Lead Channel Pacing Threshold Pulse Width: 0.4 ms
Lead Channel Sensing Intrinsic Amplitude: 2.8 mV
Lead Channel Sensing Intrinsic Amplitude: 3.1 mV
Lead Channel Setting Pacing Amplitude: 1.25 V
Lead Channel Setting Pacing Amplitude: 2 V
Lead Channel Setting Pacing Pulse Width: 0.4 ms
Lead Channel Setting Sensing Sensitivity: 0.5 mV
Pulse Gen Model: 2272
Pulse Gen Serial Number: 6519548

## 2022-11-18 DIAGNOSIS — Z85828 Personal history of other malignant neoplasm of skin: Secondary | ICD-10-CM | POA: Diagnosis not present

## 2022-11-18 DIAGNOSIS — L570 Actinic keratosis: Secondary | ICD-10-CM | POA: Diagnosis not present

## 2022-11-25 DIAGNOSIS — M810 Age-related osteoporosis without current pathological fracture: Secondary | ICD-10-CM | POA: Diagnosis not present

## 2022-11-25 DIAGNOSIS — E063 Autoimmune thyroiditis: Secondary | ICD-10-CM | POA: Diagnosis not present

## 2022-11-25 DIAGNOSIS — M5000 Cervical disc disorder with myelopathy, unspecified cervical region: Secondary | ICD-10-CM | POA: Diagnosis not present

## 2022-11-25 DIAGNOSIS — M159 Polyosteoarthritis, unspecified: Secondary | ICD-10-CM | POA: Diagnosis not present

## 2022-11-25 DIAGNOSIS — G894 Chronic pain syndrome: Secondary | ICD-10-CM | POA: Diagnosis not present

## 2022-12-04 ENCOUNTER — Ambulatory Visit: Payer: Medicare Other | Attending: Cardiovascular Disease | Admitting: Cardiovascular Disease

## 2022-12-04 ENCOUNTER — Encounter: Payer: Self-pay | Admitting: Cardiovascular Disease

## 2022-12-04 VITALS — BP 128/62 | HR 68 | Ht 62.0 in | Wt 151.0 lb

## 2022-12-04 DIAGNOSIS — I700 Atherosclerosis of aorta: Secondary | ICD-10-CM | POA: Diagnosis not present

## 2022-12-04 DIAGNOSIS — I4719 Other supraventricular tachycardia: Secondary | ICD-10-CM | POA: Diagnosis not present

## 2022-12-04 DIAGNOSIS — Z950 Presence of cardiac pacemaker: Secondary | ICD-10-CM | POA: Diagnosis not present

## 2022-12-04 DIAGNOSIS — I495 Sick sinus syndrome: Secondary | ICD-10-CM

## 2022-12-04 DIAGNOSIS — I2510 Atherosclerotic heart disease of native coronary artery without angina pectoris: Secondary | ICD-10-CM

## 2022-12-04 NOTE — Progress Notes (Signed)
Cardiology Office Note    Date:  12/10/2022   ID:  [name], DOB 02-19-1951, MRN [id number]  PCP:  Redmond School, MD  Cardiologist:   Sanda Klein, MD   Chief complaint: Pacemaker check  History of Present Illness:  [name] is a 71 y.o. female with tachycardia-bradycardia syndrome (sinus bradycardia and paroxysmal atrial tachycardia) with a dual-chamber permanent pacemaker ([employer]. [employer] w 2088 leads implanted 2012, gen change Assurity MRI August 2022).  She feels better.  She no longer has complaints of pain "in all joints".  She saw Dr. Percival Spanish in clinic last September with complaints of atypical chest pain.  Her nuclear stress test was normal, other than a small fixed apical defect consistent with artifact.  The patient specifically denies any chest pain at rest exertion, dyspnea at rest or with exertion, orthopnea, paroxysmal nocturnal dyspnea, syncope, palpitations, focal neurological deficits, intermittent claudication, lower extremity edema, unexplained weight gain, cough, hemoptysis or wheezing.   Device interrogation shows normal function.  Her new pacemaker is a MRI conditional generator and her leads are also MRI conditional.  She has 9 % atrial pacing and 1% ventricular pacing.  Estimated generator longevity is over 10 years.  There have been no meaningful episodes of atrial or ventricular arrhythmia.  In the past she has had infrequent episodes of atrial tachycardia.  These are sometimes sustained but the longest we have recorded was about 4 minutes in duration.  She has never had true atrial fibrillation.  She had early surgical menopause after complete hysterectomy.  She wonders whether she should take phytoestrogen supplements for her achy joints and bones.  Past Medical History:  Diagnosis Date   Anxiety    Diverticulosis of colon    History of kidney stones    Osteoarthritis    Osteoporosis    Paroxysmal atrial fibrillation (HCC)    with SVT  response   Presence of permanent cardiac pacemaker    Sinus node dysfunction (HCC)    Tachy-brady syndrome (HCC)     Past Surgical History:  Procedure Laterality Date   ABDOMINAL HYSTERECTOMY     APPENDECTOMY     BREAST BIOPSY     right, benign   CERVICAL SPINE SURGERY     two   CHOLECYSTECTOMY     COLONOSCOPY  2007   sigmoid diverticula, few ulcerations of terminal ileum, biopsies of TI unremarkable, random colon bx neg for microscopic colitis.   COLONOSCOPY  06/04/11   Dr. Gala Romney- pegmentation of the rectum, pancolonic diverticula, solitary ulcer in the mouth of the ileocecal valve, distal 15 cm of the terminal ileum appeared normal. Random colon biopsies were benign. Ileocecal valve ulcer or was benign without any supportive evidence of inflammatory bowel disease.   COLONOSCOPY WITH PROPOFOL N/A 04/26/2018   Dr. Gala Romney: diverticulosis, no future screening colonoscopies planned due to age.   ESOPHAGOGASTRODUODENOSCOPY  06/04/11   Dr. Vivi Ferns esophagus, deffuse petechial and gastric submucosal petechiae- benign mucousa on bx. Small bowel biopsy benign. No H. pylori.   ESOPHAGOGASTRODUODENOSCOPY (EGD) WITH PROPOFOL N/A 07/28/2019   Dr. Emerson Monte: Small hiatal hernia, dyspepsia likely due to Fosamax   INSERT / REPLACE / REMOVE PACEMAKER     left hip surgery     in [place], around 2018   [place]  07/2011   PPM GENERATOR CHANGEOUT N/A 08/12/2021   Procedure: [place];  Surgeon: Sanda Klein, MD;  Location: [facility];  Service: Cardiovascular;  Laterality:  N/A;   s/p hysterectomy     sb capsule study  2009   Two single small nonbleeding AVMs in the  proximal small bowel, single lymphangiectasia in mid small bowel.   vocal cord polypectomy      Current Medications: Outpatient Medications Prior to Visit  Medication Sig Dispense Refill   ALPRAZolam (XANAX) 0.5 MG tablet Take 0.5 mg by mouth daily as needed for anxiety.     b complex  vitamins tablet Take 1 tablet by mouth daily.     CALCIUM-VITAMIN D PO Take 1 tablet by mouth 2 (two) times daily.     Cholecalciferol (VITAMIN D-3) 125 MCG (5000 UT) TABS Take 5,000 Units by mouth daily. Vit K 180 mg     mirtazapine (REMERON) 45 MG tablet Take 45 mg by mouth daily.     Omega-3 Fatty Acids (FISH OIL) 1000 MG CAPS Take 1,000 mg by mouth 2 (two) times daily.     Oxycodone HCl 20 MG TABS Take 1 tablet by mouth every 6 (six) hours.     zolpidem (AMBIEN) 10 MG tablet Take 10 mg by mouth at bedtime.     Menthol, Topical Analgesic, (BIOFREEZE COLORLESS) 4 % GEL Apply 1 application topically daily as needed (Pain). (Patient not taking: Reported on 12/04/2022)     oxyCODONE (ROXICODONE) 15 MG immediate release tablet Take 15 mg by mouth 4 (four) times daily. (Patient not taking: Reported on 12/04/2022)     pantoprazole (PROTONIX) 40 MG tablet Take 1 tablet (40 mg total) by mouth daily as needed (Heartburn). (Patient not taking: Reported on 12/04/2022)     No facility-administered medications prior to visit.     Allergies:   Ampicillin and Iodine   Social History   Socioeconomic History   Marital status: Divorced    Spouse name: Not on file   Number of children: Not on file   Years of education: Not on file   Highest education level: Not on file  Occupational History   Occupation: [employer]  Tobacco Use   Smoking status: Former    Types: Cigarettes    Quit date: 12/15/1973    Years since quitting: 49.0   Smokeless tobacco: Never   Tobacco comments:    occas  Vaping Use   Vaping Use: Never used  Substance and Sexual Activity   Alcohol use: No   Drug use: No   Sexual activity: Not on file  Other Topics Concern   Not on file  Social History Narrative   Has one son   Social Determinants of Health   Financial Resource Strain: Not on file  Food Insecurity: Not on file  Transportation Needs: Not on file  Physical Activity: Not on file  Stress: Not on file  Social  Connections: Not on file     Family History:  The patient's family history includes COPD (age of onset: 79) in her mother; Diabetes in her brother; GI problems in her sister; Heart attack (age of onset: 15) in her father; Hypertension in her brother; Stroke (age of onset: 75) in her father.   ROS:   Please see the history of present illness.    ROS All other systems are reviewed and are negative.   PHYSICAL EXAM:   VS:  BP 128/62 (BP Location: Left Arm, Patient Position: Sitting, Cuff Size: Normal)   Pulse 68   Ht '5\' 2"'$  (1.575 m)   Wt 151 lb (68.5 kg)   SpO2 93%   BMI 27.62 kg/m  General: Alert, oriented x3, no distress, healthy left subclavian pacemaker site Head: no evidence of trauma, PERRL, EOMI, no exophtalmos or lid lag, no myxedema, no xanthelasma; normal ears, nose and oropharynx Neck: normal jugular venous pulsations and no hepatojugular reflux; brisk carotid pulses without delay and no carotid bruits Chest: clear to auscultation, no signs of consolidation by percussion or palpation, normal fremitus, symmetrical and full respiratory excursions Cardiovascular: normal position and quality of the apical impulse, regular rhythm, normal first and second heart sounds, no murmurs, rubs or gallops Abdomen: no tenderness or distention, no masses by palpation, no abnormal pulsatility or arterial bruits, normal bowel sounds, no hepatosplenomegaly Extremities: no clubbing, cyanosis or edema; 2+ radial, ulnar and brachial pulses bilaterally; 2+ right femoral, posterior tibial and dorsalis pedis pulses; 2+ left femoral, posterior tibial and dorsalis pedis pulses; no subclavian or femoral bruits Neurological: grossly nonfocal Psych: Normal mood and affect   Wt Readings from Last 3 Encounters:  12/04/22 151 lb (68.5 kg)  09/04/22 151 lb 3.2 oz (68.6 kg)  11/25/21 143 lb (64.9 kg)     Studies/Labs Reviewed:  Leane Call 09/24/2022   Findings are consistent with no prior  ischemia. The study is low risk.   No ST deviation was noted.   LV perfusion is abnormal. Defect 1: There is a small defect with moderate reduction in uptake present in the apical apex location(s) that is fixed. There is normal wall motion in the defect area. Consistent with artifact caused by breast attenuation.   Left ventricular function is normal. End diastolic cavity size is normal. End systolic cavity size is normal.   Prior study available for comparison from 05/05/2008.   Small size, moderate intensity fixed apical/apical septal perfusion defect, suspicious for breast attenuation artifact. No significant reversible ischemia (SDS 1). LVEF 83% with normal wall motion. This is a low risk study. Prior study in 2009, not directly available to review.   EKG:  EKG is not ordered today.  The most recent ECG from 09/04/2021 shows normal sinus rhythm, normal tracing.  Recent Labs: 09/04/2022: ALT 12; BNP 58.8; BUN 5; Creatinine, Ser 0.71; Hemoglobin 12.2; Platelets 216; Potassium 4.3; Sodium 136; TSH 3.610  09/13/2019 potassium 3.7, creatinine 0.8, hemoglobin A1c 7% Lipid Panel    Component Value Date/Time   CHOL 156 07/17/2011 0605   TRIG 107 07/17/2011 0605   HDL 52 07/17/2011 0605   CHOLHDL 3.0 07/17/2011 0605   VLDL 21 07/17/2011 0605   LDLCALC 83 07/17/2011 0605   09/13/2019 total cholesterol 130, HDL 53, LDL 54, triglycerides 255 09/21/2020 Chol 198, HDL 50, TG 121  ASSESSMENT:    1. SSS (sick sinus syndrome) ([place])   2. PAT (paroxysmal atrial tachycardia) ([place])   3. Pacemaker   4. Coronary artery calcification seen on CT scan   5. Atherosclerosis of aorta (HCC)      PLAN:  In order of problems listed above:  SSS: Asymptomatic.  Normal heart rate histogram distribution. PAT: None seen on the most recent pacemaker download.  Although occasionally sustained on past pacemaker interrogation, the episodes remain brief and are infrequent and asymptomatic and do not require specific  therapy. PPM: Recent generator change, well-healed site.  Continue remote downloads every 3 months. Coronary calcification and aortic atherosclerosis noted on CT: Normal Lexiscan Myoview in September 2023.  No angina pectoris.  Not on any lipid-lowering medication.  LDL cholesterol most recently elevated at 115, substantially higher than it was in 2020 at 54.  Ideally would like her  LDL less than 70.  She prefers not to take medications and with her multiple musculoskeletal complaints it may be difficult to detect statin side effects.  She does not have clinically significant CAD or PAD.   Medication Adjustments/Labs and Tests Ordered: Current medicines are reviewed at length with the patient today.  Concerns regarding medicines are outlined above.  Medication changes, Labs and Tests ordered today are listed in the Patient Instructions below. Patient Instructions  Medication Instructions:  No changes  *If you need a refill on your cardiac medications before your next appointment, please call your pharmacy*   Lab Work: Not  needed    Testing/Procedures:  Not needed  Follow-Up: At [facility], you and your health needs are our priority.  As part of our continuing mission to provide you with exceptional heart care, we have created designated Provider Care Teams.  These Care Teams include your primary Cardiologist (physician) and Advanced Practice Providers (APPs -  Physician Assistants and Nurse Practitioners) who all work together to provide you with the care you need, when you need it.     Your next appointment:   12 month(s)  The format for your next appointment:   In Person  Provider:   Sanda Klein, MD      Signed, Sanda Klein, MD  12/10/2022 11:32 AM    [facility] [address], [place], [place]  [address] Phone: [phone]; Fax: [phone] ,mg

## 2022-12-04 NOTE — Patient Instructions (Signed)
Medication Instructions:   No changes  *If you need a refill on your cardiac medications before your next appointment, please call your pharmacy*   Lab Work: Not needed    Testing/Procedures:  Not  needed  Follow-Up: At [facility], you and your health needs are our priority.  As part of our continuing mission to provide you with exceptional heart care, we have created designated Provider Care Teams.  These Care Teams include your primary Cardiologist (physician) and Advanced Practice Providers (APPs -  Physician Assistants and Nurse Practitioners) who all work together to provide you with the care you need, when you need it.     Your next appointment:   12 month(s)  The format for your next appointment:   In Person  Provider:   Mihai Croitoru, MD    

## 2022-12-10 NOTE — Progress Notes (Signed)
Remote pacemaker transmission.   

## 2022-12-26 DIAGNOSIS — Z0001 Encounter for general adult medical examination with abnormal findings: Secondary | ICD-10-CM | POA: Diagnosis not present

## 2022-12-26 DIAGNOSIS — M810 Age-related osteoporosis without current pathological fracture: Secondary | ICD-10-CM | POA: Diagnosis not present

## 2022-12-26 DIAGNOSIS — I495 Sick sinus syndrome: Secondary | ICD-10-CM | POA: Diagnosis not present

## 2022-12-26 DIAGNOSIS — M47816 Spondylosis without myelopathy or radiculopathy, lumbar region: Secondary | ICD-10-CM | POA: Diagnosis not present

## 2022-12-26 DIAGNOSIS — I4891 Unspecified atrial fibrillation: Secondary | ICD-10-CM | POA: Diagnosis not present

## 2022-12-26 DIAGNOSIS — G894 Chronic pain syndrome: Secondary | ICD-10-CM | POA: Diagnosis not present

## 2022-12-26 DIAGNOSIS — I700 Atherosclerosis of aorta: Secondary | ICD-10-CM | POA: Diagnosis not present

## 2022-12-26 DIAGNOSIS — M5000 Cervical disc disorder with myelopathy, unspecified cervical region: Secondary | ICD-10-CM | POA: Diagnosis not present

## 2023-01-20 DIAGNOSIS — E039 Hypothyroidism, unspecified: Secondary | ICD-10-CM | POA: Diagnosis not present

## 2023-01-20 DIAGNOSIS — R7309 Other abnormal glucose: Secondary | ICD-10-CM | POA: Diagnosis not present

## 2023-01-20 DIAGNOSIS — D518 Other vitamin B12 deficiency anemias: Secondary | ICD-10-CM | POA: Diagnosis not present

## 2023-01-20 DIAGNOSIS — E559 Vitamin D deficiency, unspecified: Secondary | ICD-10-CM | POA: Diagnosis not present

## 2023-01-20 DIAGNOSIS — Z0001 Encounter for general adult medical examination with abnormal findings: Secondary | ICD-10-CM | POA: Diagnosis not present

## 2023-01-26 DIAGNOSIS — I700 Atherosclerosis of aorta: Secondary | ICD-10-CM | POA: Diagnosis not present

## 2023-01-26 DIAGNOSIS — I495 Sick sinus syndrome: Secondary | ICD-10-CM | POA: Diagnosis not present

## 2023-01-26 DIAGNOSIS — M47816 Spondylosis without myelopathy or radiculopathy, lumbar region: Secondary | ICD-10-CM | POA: Diagnosis not present

## 2023-01-26 DIAGNOSIS — G894 Chronic pain syndrome: Secondary | ICD-10-CM | POA: Diagnosis not present

## 2023-01-26 DIAGNOSIS — R5383 Other fatigue: Secondary | ICD-10-CM | POA: Diagnosis not present

## 2023-01-26 DIAGNOSIS — I4891 Unspecified atrial fibrillation: Secondary | ICD-10-CM | POA: Diagnosis not present

## 2023-01-26 DIAGNOSIS — M810 Age-related osteoporosis without current pathological fracture: Secondary | ICD-10-CM | POA: Diagnosis not present

## 2023-01-26 DIAGNOSIS — M5000 Cervical disc disorder with myelopathy, unspecified cervical region: Secondary | ICD-10-CM | POA: Diagnosis not present

## 2023-01-26 DIAGNOSIS — M159 Polyosteoarthritis, unspecified: Secondary | ICD-10-CM | POA: Diagnosis not present

## 2023-02-10 ENCOUNTER — Ambulatory Visit: Payer: Medicare Other

## 2023-02-10 DIAGNOSIS — I495 Sick sinus syndrome: Secondary | ICD-10-CM

## 2023-02-10 LAB — CUP PACEART REMOTE DEVICE CHECK
Battery Remaining Longevity: 117 mo
Battery Remaining Percentage: 90 %
Battery Voltage: 3.02 V
Brady Statistic AP VP Percent: 1 %
Brady Statistic AP VS Percent: 8.6 %
Brady Statistic AS VP Percent: 1 %
Brady Statistic AS VS Percent: 91 %
Brady Statistic RA Percent Paced: 8.2 %
Brady Statistic RV Percent Paced: 1 %
Date Time Interrogation Session: 20240227020012
Implantable Lead Connection Status: 753985
Implantable Lead Connection Status: 753985
Implantable Lead Implant Date: 20120802
Implantable Lead Implant Date: 20120802
Implantable Lead Location: 753859
Implantable Lead Location: 753860
Implantable Pulse Generator Implant Date: 20220829
Lead Channel Impedance Value: 410 Ohm
Lead Channel Impedance Value: 450 Ohm
Lead Channel Pacing Threshold Amplitude: 0.5 V
Lead Channel Pacing Threshold Amplitude: 1.125 V
Lead Channel Pacing Threshold Pulse Width: 0.4 ms
Lead Channel Pacing Threshold Pulse Width: 0.4 ms
Lead Channel Sensing Intrinsic Amplitude: 2.3 mV
Lead Channel Sensing Intrinsic Amplitude: 2.3 mV
Lead Channel Setting Pacing Amplitude: 1.375
Lead Channel Setting Pacing Amplitude: 2 V
Lead Channel Setting Pacing Pulse Width: 0.4 ms
Lead Channel Setting Sensing Sensitivity: 0.5 mV
Pulse Gen Model: 2272
Pulse Gen Serial Number: 6519548

## 2023-02-27 DIAGNOSIS — I495 Sick sinus syndrome: Secondary | ICD-10-CM | POA: Diagnosis not present

## 2023-02-27 DIAGNOSIS — M47816 Spondylosis without myelopathy or radiculopathy, lumbar region: Secondary | ICD-10-CM | POA: Diagnosis not present

## 2023-02-27 DIAGNOSIS — I700 Atherosclerosis of aorta: Secondary | ICD-10-CM | POA: Diagnosis not present

## 2023-02-27 DIAGNOSIS — G894 Chronic pain syndrome: Secondary | ICD-10-CM | POA: Diagnosis not present

## 2023-02-27 DIAGNOSIS — M159 Polyosteoarthritis, unspecified: Secondary | ICD-10-CM | POA: Diagnosis not present

## 2023-02-27 DIAGNOSIS — M5000 Cervical disc disorder with myelopathy, unspecified cervical region: Secondary | ICD-10-CM | POA: Diagnosis not present

## 2023-02-27 DIAGNOSIS — I4891 Unspecified atrial fibrillation: Secondary | ICD-10-CM | POA: Diagnosis not present

## 2023-02-27 DIAGNOSIS — M810 Age-related osteoporosis without current pathological fracture: Secondary | ICD-10-CM | POA: Diagnosis not present

## 2023-03-18 NOTE — Progress Notes (Signed)
Remote pacemaker transmission.   

## 2023-03-27 DIAGNOSIS — H6010 Cellulitis of external ear, unspecified ear: Secondary | ICD-10-CM | POA: Diagnosis not present

## 2023-03-27 DIAGNOSIS — M5000 Cervical disc disorder with myelopathy, unspecified cervical region: Secondary | ICD-10-CM | POA: Diagnosis not present

## 2023-03-27 DIAGNOSIS — I700 Atherosclerosis of aorta: Secondary | ICD-10-CM | POA: Diagnosis not present

## 2023-03-27 DIAGNOSIS — M47816 Spondylosis without myelopathy or radiculopathy, lumbar region: Secondary | ICD-10-CM | POA: Diagnosis not present

## 2023-03-27 DIAGNOSIS — M159 Polyosteoarthritis, unspecified: Secondary | ICD-10-CM | POA: Diagnosis not present

## 2023-03-27 DIAGNOSIS — G894 Chronic pain syndrome: Secondary | ICD-10-CM | POA: Diagnosis not present

## 2023-03-27 DIAGNOSIS — Z6828 Body mass index (BMI) 28.0-28.9, adult: Secondary | ICD-10-CM | POA: Diagnosis not present

## 2023-03-27 DIAGNOSIS — L570 Actinic keratosis: Secondary | ICD-10-CM | POA: Diagnosis not present

## 2023-03-27 DIAGNOSIS — I4891 Unspecified atrial fibrillation: Secondary | ICD-10-CM | POA: Diagnosis not present

## 2023-03-27 DIAGNOSIS — R69 Illness, unspecified: Secondary | ICD-10-CM | POA: Diagnosis not present

## 2023-03-27 DIAGNOSIS — I495 Sick sinus syndrome: Secondary | ICD-10-CM | POA: Diagnosis not present

## 2023-03-27 DIAGNOSIS — M810 Age-related osteoporosis without current pathological fracture: Secondary | ICD-10-CM | POA: Diagnosis not present

## 2023-04-24 DIAGNOSIS — Z6828 Body mass index (BMI) 28.0-28.9, adult: Secondary | ICD-10-CM | POA: Diagnosis not present

## 2023-04-24 DIAGNOSIS — I495 Sick sinus syndrome: Secondary | ICD-10-CM | POA: Diagnosis not present

## 2023-04-24 DIAGNOSIS — M47816 Spondylosis without myelopathy or radiculopathy, lumbar region: Secondary | ICD-10-CM | POA: Diagnosis not present

## 2023-04-24 DIAGNOSIS — G894 Chronic pain syndrome: Secondary | ICD-10-CM | POA: Diagnosis not present

## 2023-04-24 DIAGNOSIS — I4891 Unspecified atrial fibrillation: Secondary | ICD-10-CM | POA: Diagnosis not present

## 2023-04-24 DIAGNOSIS — I700 Atherosclerosis of aorta: Secondary | ICD-10-CM | POA: Diagnosis not present

## 2023-04-24 DIAGNOSIS — M159 Polyosteoarthritis, unspecified: Secondary | ICD-10-CM | POA: Diagnosis not present

## 2023-04-24 DIAGNOSIS — E6609 Other obesity due to excess calories: Secondary | ICD-10-CM | POA: Diagnosis not present

## 2023-04-24 DIAGNOSIS — N3281 Overactive bladder: Secondary | ICD-10-CM | POA: Diagnosis not present

## 2023-04-24 DIAGNOSIS — M810 Age-related osteoporosis without current pathological fracture: Secondary | ICD-10-CM | POA: Diagnosis not present

## 2023-04-24 DIAGNOSIS — M5000 Cervical disc disorder with myelopathy, unspecified cervical region: Secondary | ICD-10-CM | POA: Diagnosis not present

## 2023-05-12 ENCOUNTER — Ambulatory Visit (INDEPENDENT_AMBULATORY_CARE_PROVIDER_SITE_OTHER): Payer: Medicare HMO

## 2023-05-12 DIAGNOSIS — I495 Sick sinus syndrome: Secondary | ICD-10-CM

## 2023-05-13 LAB — CUP PACEART REMOTE DEVICE CHECK
Battery Remaining Longevity: 115 mo
Battery Remaining Percentage: 88 %
Battery Voltage: 3.02 V
Brady Statistic AP VP Percent: 1 %
Brady Statistic AP VS Percent: 10 %
Brady Statistic AS VP Percent: 1 %
Brady Statistic AS VS Percent: 90 %
Brady Statistic RA Percent Paced: 9.5 %
Brady Statistic RV Percent Paced: 1 %
Date Time Interrogation Session: 20240528020034
Implantable Lead Connection Status: 753985
Implantable Lead Connection Status: 753985
Implantable Lead Implant Date: 20120802
Implantable Lead Implant Date: 20120802
Implantable Lead Location: 753859
Implantable Lead Location: 753860
Implantable Pulse Generator Implant Date: 20220829
Lead Channel Impedance Value: 410 Ohm
Lead Channel Impedance Value: 460 Ohm
Lead Channel Pacing Threshold Amplitude: 0.5 V
Lead Channel Pacing Threshold Amplitude: 1 V
Lead Channel Pacing Threshold Pulse Width: 0.4 ms
Lead Channel Pacing Threshold Pulse Width: 0.4 ms
Lead Channel Sensing Intrinsic Amplitude: 2.1 mV
Lead Channel Sensing Intrinsic Amplitude: 2.4 mV
Lead Channel Setting Pacing Amplitude: 1.25 V
Lead Channel Setting Pacing Amplitude: 2 V
Lead Channel Setting Pacing Pulse Width: 0.4 ms
Lead Channel Setting Sensing Sensitivity: 0.5 mV
Pulse Gen Model: 2272
Pulse Gen Serial Number: 6519548

## 2023-05-28 DIAGNOSIS — E785 Hyperlipidemia, unspecified: Secondary | ICD-10-CM | POA: Diagnosis not present

## 2023-05-28 DIAGNOSIS — M1990 Unspecified osteoarthritis, unspecified site: Secondary | ICD-10-CM | POA: Diagnosis not present

## 2023-05-28 DIAGNOSIS — F419 Anxiety disorder, unspecified: Secondary | ICD-10-CM | POA: Diagnosis not present

## 2023-05-28 DIAGNOSIS — F1310 Sedative, hypnotic or anxiolytic abuse, uncomplicated: Secondary | ICD-10-CM | POA: Diagnosis not present

## 2023-05-28 DIAGNOSIS — G4700 Insomnia, unspecified: Secondary | ICD-10-CM | POA: Diagnosis not present

## 2023-05-28 DIAGNOSIS — K219 Gastro-esophageal reflux disease without esophagitis: Secondary | ICD-10-CM | POA: Diagnosis not present

## 2023-05-28 DIAGNOSIS — N3941 Urge incontinence: Secondary | ICD-10-CM | POA: Diagnosis not present

## 2023-05-28 DIAGNOSIS — I495 Sick sinus syndrome: Secondary | ICD-10-CM | POA: Diagnosis not present

## 2023-05-28 DIAGNOSIS — R030 Elevated blood-pressure reading, without diagnosis of hypertension: Secondary | ICD-10-CM | POA: Diagnosis not present

## 2023-05-28 DIAGNOSIS — F1110 Opioid abuse, uncomplicated: Secondary | ICD-10-CM | POA: Diagnosis not present

## 2023-05-28 DIAGNOSIS — Z87891 Personal history of nicotine dependence: Secondary | ICD-10-CM | POA: Diagnosis not present

## 2023-05-28 DIAGNOSIS — F329 Major depressive disorder, single episode, unspecified: Secondary | ICD-10-CM | POA: Diagnosis not present

## 2023-06-08 NOTE — Progress Notes (Signed)
Remote pacemaker transmission.   

## 2023-07-10 DIAGNOSIS — I495 Sick sinus syndrome: Secondary | ICD-10-CM | POA: Diagnosis not present

## 2023-07-10 DIAGNOSIS — Z6827 Body mass index (BMI) 27.0-27.9, adult: Secondary | ICD-10-CM | POA: Diagnosis not present

## 2023-07-10 DIAGNOSIS — N3281 Overactive bladder: Secondary | ICD-10-CM | POA: Diagnosis not present

## 2023-07-10 DIAGNOSIS — M159 Polyosteoarthritis, unspecified: Secondary | ICD-10-CM | POA: Diagnosis not present

## 2023-07-10 DIAGNOSIS — M47816 Spondylosis without myelopathy or radiculopathy, lumbar region: Secondary | ICD-10-CM | POA: Diagnosis not present

## 2023-07-10 DIAGNOSIS — R5383 Other fatigue: Secondary | ICD-10-CM | POA: Diagnosis not present

## 2023-07-10 DIAGNOSIS — M810 Age-related osteoporosis without current pathological fracture: Secondary | ICD-10-CM | POA: Diagnosis not present

## 2023-07-10 DIAGNOSIS — R232 Flushing: Secondary | ICD-10-CM | POA: Diagnosis not present

## 2023-07-10 DIAGNOSIS — E663 Overweight: Secondary | ICD-10-CM | POA: Diagnosis not present

## 2023-07-10 DIAGNOSIS — G894 Chronic pain syndrome: Secondary | ICD-10-CM | POA: Diagnosis not present

## 2023-07-17 DIAGNOSIS — E2749 Other adrenocortical insufficiency: Secondary | ICD-10-CM | POA: Diagnosis not present

## 2023-08-11 ENCOUNTER — Ambulatory Visit (INDEPENDENT_AMBULATORY_CARE_PROVIDER_SITE_OTHER): Payer: Medicare Other

## 2023-08-11 DIAGNOSIS — I495 Sick sinus syndrome: Secondary | ICD-10-CM | POA: Diagnosis not present

## 2023-08-12 LAB — CUP PACEART REMOTE DEVICE CHECK
Battery Remaining Longevity: 113 mo
Battery Remaining Percentage: 86 %
Battery Voltage: 3.02 V
Brady Statistic AP VP Percent: 1 %
Brady Statistic AP VS Percent: 10 %
Brady Statistic AS VP Percent: 1 %
Brady Statistic AS VS Percent: 90 %
Brady Statistic RA Percent Paced: 9.7 %
Brady Statistic RV Percent Paced: 1 %
Date Time Interrogation Session: 20240827020015
Implantable Lead Connection Status: 753985
Implantable Lead Connection Status: 753985
Implantable Lead Implant Date: 20120802
Implantable Lead Implant Date: 20120802
Implantable Lead Location: 753859
Implantable Lead Location: 753860
Implantable Pulse Generator Implant Date: 20220829
Lead Channel Impedance Value: 430 Ohm
Lead Channel Impedance Value: 460 Ohm
Lead Channel Pacing Threshold Amplitude: 0.5 V
Lead Channel Pacing Threshold Amplitude: 1 V
Lead Channel Pacing Threshold Pulse Width: 0.4 ms
Lead Channel Pacing Threshold Pulse Width: 0.4 ms
Lead Channel Sensing Intrinsic Amplitude: 2.7 mV
Lead Channel Sensing Intrinsic Amplitude: 3.4 mV
Lead Channel Setting Pacing Amplitude: 1.25 V
Lead Channel Setting Pacing Amplitude: 2 V
Lead Channel Setting Pacing Pulse Width: 0.4 ms
Lead Channel Setting Sensing Sensitivity: 0.5 mV
Pulse Gen Model: 2272
Pulse Gen Serial Number: 6519548

## 2023-08-25 NOTE — Progress Notes (Signed)
Remote pacemaker transmission.   

## 2023-09-28 DIAGNOSIS — Z6827 Body mass index (BMI) 27.0-27.9, adult: Secondary | ICD-10-CM | POA: Diagnosis not present

## 2023-09-28 DIAGNOSIS — M47816 Spondylosis without myelopathy or radiculopathy, lumbar region: Secondary | ICD-10-CM | POA: Diagnosis not present

## 2023-09-28 DIAGNOSIS — M159 Polyosteoarthritis, unspecified: Secondary | ICD-10-CM | POA: Diagnosis not present

## 2023-09-28 DIAGNOSIS — I4891 Unspecified atrial fibrillation: Secondary | ICD-10-CM | POA: Diagnosis not present

## 2023-09-28 DIAGNOSIS — E663 Overweight: Secondary | ICD-10-CM | POA: Diagnosis not present

## 2023-09-28 DIAGNOSIS — G894 Chronic pain syndrome: Secondary | ICD-10-CM | POA: Diagnosis not present

## 2023-09-28 DIAGNOSIS — E2749 Other adrenocortical insufficiency: Secondary | ICD-10-CM | POA: Diagnosis not present

## 2023-09-28 DIAGNOSIS — M810 Age-related osteoporosis without current pathological fracture: Secondary | ICD-10-CM | POA: Diagnosis not present

## 2023-09-28 DIAGNOSIS — I495 Sick sinus syndrome: Secondary | ICD-10-CM | POA: Diagnosis not present

## 2023-09-28 DIAGNOSIS — Z23 Encounter for immunization: Secondary | ICD-10-CM | POA: Diagnosis not present

## 2023-09-28 DIAGNOSIS — F411 Generalized anxiety disorder: Secondary | ICD-10-CM | POA: Diagnosis not present

## 2023-09-28 DIAGNOSIS — M5000 Cervical disc disorder with myelopathy, unspecified cervical region: Secondary | ICD-10-CM | POA: Diagnosis not present

## 2023-11-10 ENCOUNTER — Ambulatory Visit (INDEPENDENT_AMBULATORY_CARE_PROVIDER_SITE_OTHER): Payer: Medicare Other

## 2023-11-10 DIAGNOSIS — I495 Sick sinus syndrome: Secondary | ICD-10-CM | POA: Diagnosis not present

## 2023-11-10 LAB — CUP PACEART REMOTE DEVICE CHECK
Battery Remaining Longevity: 110 mo
Battery Remaining Percentage: 84 %
Battery Voltage: 3.02 V
Brady Statistic AP VP Percent: 1 %
Brady Statistic AP VS Percent: 9.5 %
Brady Statistic AS VP Percent: 1 %
Brady Statistic AS VS Percent: 90 %
Brady Statistic RA Percent Paced: 9.1 %
Brady Statistic RV Percent Paced: 1 %
Date Time Interrogation Session: 20241126020014
Implantable Lead Connection Status: 753985
Implantable Lead Connection Status: 753985
Implantable Lead Implant Date: 20120802
Implantable Lead Implant Date: 20120802
Implantable Lead Location: 753859
Implantable Lead Location: 753860
Implantable Pulse Generator Implant Date: 20220829
Lead Channel Impedance Value: 430 Ohm
Lead Channel Impedance Value: 450 Ohm
Lead Channel Pacing Threshold Amplitude: 0.5 V
Lead Channel Pacing Threshold Amplitude: 1 V
Lead Channel Pacing Threshold Pulse Width: 0.4 ms
Lead Channel Pacing Threshold Pulse Width: 0.4 ms
Lead Channel Sensing Intrinsic Amplitude: 2.8 mV
Lead Channel Sensing Intrinsic Amplitude: 3.1 mV
Lead Channel Setting Pacing Amplitude: 1.25 V
Lead Channel Setting Pacing Amplitude: 2 V
Lead Channel Setting Pacing Pulse Width: 0.4 ms
Lead Channel Setting Sensing Sensitivity: 0.5 mV
Pulse Gen Model: 2272
Pulse Gen Serial Number: 6519548

## 2023-12-10 ENCOUNTER — Ambulatory Visit: Payer: Medicare HMO | Attending: Cardiovascular Disease | Admitting: Cardiovascular Disease

## 2023-12-10 ENCOUNTER — Encounter: Payer: Self-pay | Admitting: Cardiovascular Disease

## 2023-12-10 VITALS — BP 136/60 | HR 76 | Ht 62.0 in | Wt 157.2 lb

## 2023-12-10 DIAGNOSIS — I495 Sick sinus syndrome: Secondary | ICD-10-CM | POA: Diagnosis not present

## 2023-12-10 DIAGNOSIS — I2510 Atherosclerotic heart disease of native coronary artery without angina pectoris: Secondary | ICD-10-CM

## 2023-12-10 DIAGNOSIS — Z950 Presence of cardiac pacemaker: Secondary | ICD-10-CM

## 2023-12-10 DIAGNOSIS — I700 Atherosclerosis of aorta: Secondary | ICD-10-CM

## 2023-12-10 DIAGNOSIS — I4719 Other supraventricular tachycardia: Secondary | ICD-10-CM | POA: Diagnosis not present

## 2023-12-10 DIAGNOSIS — G720 Drug-induced myopathy: Secondary | ICD-10-CM

## 2023-12-10 DIAGNOSIS — T466X5D Adverse effect of antihyperlipidemic and antiarteriosclerotic drugs, subsequent encounter: Secondary | ICD-10-CM

## 2023-12-10 NOTE — Progress Notes (Signed)
Cardiology Office Note    Date:  12/10/2023   ID:  [name], DOB [birth year], MRN [id number]  PCP:  Elfredia Nevins, MD  Cardiologist:   Thurmon Fair, MD   Chief complaint: Pacemaker check  History of Present Illness:  [name] is a 72 y.o. female with tachycardia-bradycardia syndrome (sinus bradycardia and paroxysmal atrial tachycardia) with a dual-chamber permanent pacemaker (St. Jude 2088 leads implanted 2012, gen change Assurity MRI August 2022).  She has a variety of musculoskeletal aches and pains, but no cardiac complaints. The patient specifically denies any chest pain at rest or with exertion, dyspnea at rest or with exertion, orthopnea, paroxysmal nocturnal dyspnea, syncope, palpitations, focal neurological deficits, intermittent claudication, lower extremity edema, unexplained weight gain, cough, hemoptysis or wheezing.  She reported intolerable muscle aches with statin and so despite mild hypercholesterolemia she is only taking ezetimibe.  In February her LDL close was 146, total cholesterol 219 and Dr. Carlena Sax started ezetimibe 10 mg once daily.  I do not think her lipid profile has been rechecked since, but she has an appointment next month for her usual physical.  Pacemaker interrogation shows normal device function.  She has an MRI conditional system.  She only has about 8% atrial pacing and virtually no ventricular pacing.  Estimated generator longevity is about 10 years.  She has not had any meaningful atrial or ventricular arrhythmias (rare episodes of very brief nonsustained atrial tachycardia.  No atrial fibrillation is seen.  All lead parameters are excellent.    Past Medical History:  Diagnosis Date   Anxiety    Diverticulosis of colon    History of kidney stones    Osteoarthritis    Osteoporosis    Paroxysmal atrial fibrillation (HCC)    with SVT response   Presence of permanent cardiac pacemaker    Sinus node dysfunction (HCC)     Tachy-brady syndrome (HCC)     Past Surgical History:  Procedure Laterality Date   ABDOMINAL HYSTERECTOMY     APPENDECTOMY     BREAST BIOPSY     right, benign   CERVICAL SPINE SURGERY     two   CHOLECYSTECTOMY     COLONOSCOPY  2007   sigmoid diverticula, few ulcerations of terminal ileum, biopsies of TI unremarkable, random colon bx neg for microscopic colitis.   COLONOSCOPY  06/04/11   Dr. Jena Gauss- pegmentation of the rectum, pancolonic diverticula, solitary ulcer in the mouth of the ileocecal valve, distal 15 cm of the terminal ileum appeared normal. Random colon biopsies were benign. Ileocecal valve ulcer or was benign without any supportive evidence of inflammatory bowel disease.   COLONOSCOPY WITH PROPOFOL N/A 04/26/2018   Dr. Jena Gauss: diverticulosis, no future screening colonoscopies planned due to age.   ESOPHAGOGASTRODUODENOSCOPY  06/04/11   Dr. Elmer Ramp esophagus, deffuse petechial and gastric submucosal petechiae- benign mucousa on bx. Small bowel biopsy benign. No H. pylori.   ESOPHAGOGASTRODUODENOSCOPY (EGD) WITH PROPOFOL N/A 07/28/2019   Dr. Lovena Neighbours: Small hiatal hernia, dyspepsia likely due to Fosamax   INSERT / REPLACE / REMOVE PACEMAKER     left hip surgery     in [place], around 2018   PERMANENT PACEMAKER INSERTION  07/2011   PPM GENERATOR CHANGEOUT N/A 08/12/2021   Procedure: PPM GENERATOR CHANGEOUT;  Surgeon: Thurmon Fair, MD;  Location: [facility];  Service: Cardiovascular;  Laterality: N/A;   s/p hysterectomy     sb capsule study  2009   Two single small  nonbleeding AVMs in the  proximal small bowel, single lymphangiectasia in mid small bowel.   vocal cord polypectomy      Current Medications: Outpatient Medications Prior to Visit  Medication Sig Dispense Refill   ALPRAZolam (XANAX) 0.5 MG tablet Take 0.5 mg by mouth daily as needed for anxiety.     b complex vitamins tablet Take 1 tablet by mouth daily.     CALCIUM-VITAMIN D PO Take 1 tablet by mouth  2 (two) times daily.     Cholecalciferol (VITAMIN D-3) 125 MCG (5000 UT) TABS Take 5,000 Units by mouth daily. Vit K 180 mg     Menthol, Topical Analgesic, (BIOFREEZE COLORLESS) 4 % GEL Apply 1 application  topically daily as needed (Pain).     mirtazapine (REMERON) 45 MG tablet Take 45 mg by mouth daily.     Omega-3 Fatty Acids (FISH OIL) 1000 MG CAPS Take 1,000 mg by mouth 2 (two) times daily.     Oxycodone HCl 20 MG TABS Take 1 tablet by mouth every 6 (six) hours.     pantoprazole (PROTONIX) 40 MG tablet Take 1 tablet (40 mg total) by mouth daily as needed (Heartburn).     zolpidem (AMBIEN) 10 MG tablet Take 10 mg by mouth at bedtime.     No facility-administered medications prior to visit.     Allergies:   Ampicillin and Iodine   Social History   Socioeconomic History   Marital status: Divorced    Spouse name: Not on file   Number of children: Not on file   Years of education: Not on file   Highest education level: Not on file  Occupational History   Occupation: [employer]  Tobacco Use   Smoking status: Former    Current packs/day: 0.00    Types: Cigarettes    Quit date: 12/15/1973    Years since quitting: 50.0   Smokeless tobacco: Never   Tobacco comments:    occas  Vaping Use   Vaping status: Never Used  Substance and Sexual Activity   Alcohol use: No   Drug use: No   Sexual activity: Not on file  Other Topics Concern   Not on file  Social History Narrative   Has one son   Social Drivers of Corporate investment banker Strain: Not on file  Food Insecurity: Not on file  Transportation Needs: Not on file  Physical Activity: Not on file  Stress: Not on file  Social Connections: Not on file     Family History:  The patient's family history includes COPD (age of onset: 21) in her mother; Diabetes in her brother; GI problems in her sister; Heart attack (age of onset: 78) in her father; Hypertension in her brother; Stroke (age of onset: 18) in her father.   ROS:    Please see the history of present illness.    ROS All other systems are reviewed and are negative.   PHYSICAL EXAM:   VS:  BP 136/60   Pulse 76   Ht 5\' 2"  (1.575 m)   Wt 157 lb 3.2 oz (71.3 kg)   SpO2 94%   BMI 28.75 kg/m      General: Alert, oriented x3, no distress, healthy left subclavian pacemaker site Head: no evidence of trauma, PERRL, EOMI, no exophtalmos or lid lag, no myxedema, no xanthelasma; normal ears, nose and oropharynx Neck: normal jugular venous pulsations and no hepatojugular reflux; brisk carotid pulses without delay and no carotid bruits Chest: clear to auscultation,  no signs of consolidation by percussion or palpation, normal fremitus, symmetrical and full respiratory excursions Cardiovascular: normal position and quality of the apical impulse, regular rhythm, normal first and second heart sounds, no murmurs, rubs or gallops Abdomen: no tenderness or distention, no masses by palpation, no abnormal pulsatility or arterial bruits, normal bowel sounds, no hepatosplenomegaly Extremities: no clubbing, cyanosis or edema; 2+ radial, ulnar and brachial pulses bilaterally; 2+ right femoral, posterior tibial and dorsalis pedis pulses; 2+ left femoral, posterior tibial and dorsalis pedis pulses; no subclavian or femoral bruits Neurological: grossly nonfocal Psych: Normal mood and affect   Wt Readings from Last 3 Encounters:  12/10/23 157 lb 3.2 oz (71.3 kg)  12/04/22 151 lb (68.5 kg)  09/04/22 151 lb 3.2 oz (68.6 kg)     Studies/Labs Reviewed:  Steffanie Dunn 09/24/2022   Findings are consistent with no prior ischemia. The study is low risk.   No ST deviation was noted.   LV perfusion is abnormal. Defect 1: There is a small defect with moderate reduction in uptake present in the apical apex location(s) that is fixed. There is normal wall motion in the defect area. Consistent with artifact caused by breast attenuation.   Left ventricular function is normal. End  diastolic cavity size is normal. End systolic cavity size is normal.   Prior study available for comparison from 05/05/2008.   Small size, moderate intensity fixed apical/apical septal perfusion defect, suspicious for breast attenuation artifact. No significant reversible ischemia (SDS 1). LVEF 83% with normal wall motion. This is a low risk study. Prior study in 2009, not directly available to review.   EKG:  EKG is not ordered today.  The most recent ECG from 09/04/2021 shows normal sinus rhythm, normal tracing.  Recent Labs: No results found for requested labs within last 365 days.  09/13/2019 potassium 3.7, creatinine 0.8, hemoglobin A1c 7% Lipid Panel    Component Value Date/Time   CHOL 156 07/17/2011 0605   TRIG 107 07/17/2011 0605   HDL 52 07/17/2011 0605   CHOLHDL 3.0 07/17/2011 0605   VLDL 21 07/17/2011 0605   LDLCALC 83 07/17/2011 0605   09/13/2019 total cholesterol 130, HDL 53, LDL 54, triglycerides 255 96/03/5408 Chol 198, HDL 50, TG 121  ASSESSMENT:    1. SSS (sick sinus syndrome) (HCC)   2. Paroxysmal atrial tachycardia (HCC)   3. Pacemaker   4. Atherosclerosis of aorta (HCC)   5. Coronary artery calcification seen on CT scan      PLAN:  In order of problems listed above:  SSS: Asymptomatic.  Normal heart rate histogram distribution. PAT: Infrequent events, seen roughly once a month and very brief and asymptomatic PPM: Normal device function.  Continue remote downloads every 3 months. Coronary calcification and aortic atherosclerosis noted on CT: Normal Lexiscan Myoview in September 2023.  No angina pectoris.  Not on any lipid-lowering medication.  LDL cholesterol most recently elevated at 115, substantially higher than it was in 2020 at 54.  Ideally would like her LDL less than 70.  She prefers not to take medications and with her multiple musculoskeletal complaints it may be difficult to detect statin side effects.  She does not have clinically significant CAD or  PAD.  I am doubtful that the ezetimibe will bring her to target, but will wait until we have her repeat labs before making any new recommendations.   Medication Adjustments/Labs and Tests Ordered: Current medicines are reviewed at length with the patient today.  Concerns regarding medicines are  outlined above.  Medication changes, Labs and Tests ordered today are listed in the Patient Instructions below. Patient Instructions  Medication Instructions:  No changes *If you need a refill on your cardiac medications before your next appointment, please call your pharmacy*  Follow-Up: At [facility], you and your health needs are our priority.  As part of our continuing mission to provide you with exceptional heart care, we have created designated Provider Care Teams.  These Care Teams include your primary Cardiologist (physician) and Advanced Practice Providers (APPs -  Physician Assistants and Nurse Practitioners) who all work together to provide you with the care you need, when you need it.  We recommend signing up for the patient portal called "MyChart".  Sign up information is provided on this After Visit Summary.  MyChart is used to connect with patients for Virtual Visits (Telemedicine).  Patients are able to view lab/test results, encounter notes, upcoming appointments, etc.  Non-urgent messages can be sent to your provider as well.   To learn more about what you can do with MyChart, go to [email or web address].    Your next appointment:   1 year(s)  Provider:   Thurmon Fair, MD            Signed, Thurmon Fair, MD  12/10/2023 2:30 PM    [facility] [address] [place], [place], [place]  [address] Phone: [phone]; Fax: [phone] ,mg

## 2023-12-10 NOTE — Patient Instructions (Signed)

## 2023-12-14 NOTE — Progress Notes (Signed)
Remote pacemaker transmission.   

## 2023-12-25 DIAGNOSIS — M810 Age-related osteoporosis without current pathological fracture: Secondary | ICD-10-CM | POA: Diagnosis not present

## 2023-12-25 DIAGNOSIS — G894 Chronic pain syndrome: Secondary | ICD-10-CM | POA: Diagnosis not present

## 2023-12-25 DIAGNOSIS — I4891 Unspecified atrial fibrillation: Secondary | ICD-10-CM | POA: Diagnosis not present

## 2023-12-25 DIAGNOSIS — M47816 Spondylosis without myelopathy or radiculopathy, lumbar region: Secondary | ICD-10-CM | POA: Diagnosis not present

## 2023-12-25 DIAGNOSIS — F411 Generalized anxiety disorder: Secondary | ICD-10-CM | POA: Diagnosis not present

## 2023-12-25 DIAGNOSIS — I700 Atherosclerosis of aorta: Secondary | ICD-10-CM | POA: Diagnosis not present

## 2023-12-25 DIAGNOSIS — M159 Polyosteoarthritis, unspecified: Secondary | ICD-10-CM | POA: Diagnosis not present

## 2023-12-25 DIAGNOSIS — K2100 Gastro-esophageal reflux disease with esophagitis, without bleeding: Secondary | ICD-10-CM | POA: Diagnosis not present

## 2023-12-25 DIAGNOSIS — Z6828 Body mass index (BMI) 28.0-28.9, adult: Secondary | ICD-10-CM | POA: Diagnosis not present

## 2023-12-25 DIAGNOSIS — E2749 Other adrenocortical insufficiency: Secondary | ICD-10-CM | POA: Diagnosis not present

## 2023-12-25 DIAGNOSIS — I495 Sick sinus syndrome: Secondary | ICD-10-CM | POA: Diagnosis not present

## 2024-02-02 DIAGNOSIS — Z833 Family history of diabetes mellitus: Secondary | ICD-10-CM | POA: Diagnosis not present

## 2024-02-02 DIAGNOSIS — I700 Atherosclerosis of aorta: Secondary | ICD-10-CM | POA: Diagnosis not present

## 2024-02-02 DIAGNOSIS — M810 Age-related osteoporosis without current pathological fracture: Secondary | ICD-10-CM | POA: Diagnosis not present

## 2024-02-02 DIAGNOSIS — F325 Major depressive disorder, single episode, in full remission: Secondary | ICD-10-CM | POA: Diagnosis not present

## 2024-02-02 DIAGNOSIS — M1990 Unspecified osteoarthritis, unspecified site: Secondary | ICD-10-CM | POA: Diagnosis not present

## 2024-02-02 DIAGNOSIS — I129 Hypertensive chronic kidney disease with stage 1 through stage 4 chronic kidney disease, or unspecified chronic kidney disease: Secondary | ICD-10-CM | POA: Diagnosis not present

## 2024-02-02 DIAGNOSIS — E785 Hyperlipidemia, unspecified: Secondary | ICD-10-CM | POA: Diagnosis not present

## 2024-02-02 DIAGNOSIS — Z8249 Family history of ischemic heart disease and other diseases of the circulatory system: Secondary | ICD-10-CM | POA: Diagnosis not present

## 2024-02-02 DIAGNOSIS — D6869 Other thrombophilia: Secondary | ICD-10-CM | POA: Diagnosis not present

## 2024-02-02 DIAGNOSIS — I4891 Unspecified atrial fibrillation: Secondary | ICD-10-CM | POA: Diagnosis not present

## 2024-02-02 DIAGNOSIS — G4700 Insomnia, unspecified: Secondary | ICD-10-CM | POA: Diagnosis not present

## 2024-02-02 DIAGNOSIS — K219 Gastro-esophageal reflux disease without esophagitis: Secondary | ICD-10-CM | POA: Diagnosis not present

## 2024-02-09 ENCOUNTER — Ambulatory Visit (INDEPENDENT_AMBULATORY_CARE_PROVIDER_SITE_OTHER): Payer: Medicare Other

## 2024-02-09 DIAGNOSIS — I495 Sick sinus syndrome: Secondary | ICD-10-CM

## 2024-02-11 LAB — CUP PACEART REMOTE DEVICE CHECK
Battery Remaining Longevity: 107 mo
Battery Remaining Percentage: 82 %
Battery Voltage: 3.02 V
Brady Statistic AP VP Percent: 1 %
Brady Statistic AP VS Percent: 5.9 %
Brady Statistic AS VP Percent: 1 %
Brady Statistic AS VS Percent: 94 %
Brady Statistic RA Percent Paced: 5.6 %
Brady Statistic RV Percent Paced: 1 %
Date Time Interrogation Session: 20250226100154
Implantable Lead Connection Status: 753985
Implantable Lead Connection Status: 753985
Implantable Lead Implant Date: 20120802
Implantable Lead Implant Date: 20120802
Implantable Lead Location: 753859
Implantable Lead Location: 753860
Implantable Pulse Generator Implant Date: 20220829
Lead Channel Impedance Value: 410 Ohm
Lead Channel Impedance Value: 460 Ohm
Lead Channel Pacing Threshold Amplitude: 0.5 V
Lead Channel Pacing Threshold Amplitude: 1 V
Lead Channel Pacing Threshold Pulse Width: 0.4 ms
Lead Channel Pacing Threshold Pulse Width: 0.4 ms
Lead Channel Sensing Intrinsic Amplitude: 2.4 mV
Lead Channel Sensing Intrinsic Amplitude: 3.3 mV
Lead Channel Setting Pacing Amplitude: 1.25 V
Lead Channel Setting Pacing Amplitude: 2 V
Lead Channel Setting Pacing Pulse Width: 0.4 ms
Lead Channel Setting Sensing Sensitivity: 0.5 mV
Pulse Gen Model: 2272
Pulse Gen Serial Number: 6519548

## 2024-02-12 DIAGNOSIS — E782 Mixed hyperlipidemia: Secondary | ICD-10-CM | POA: Diagnosis not present

## 2024-02-12 DIAGNOSIS — I10 Essential (primary) hypertension: Secondary | ICD-10-CM | POA: Diagnosis not present

## 2024-03-01 DIAGNOSIS — M810 Age-related osteoporosis without current pathological fracture: Secondary | ICD-10-CM | POA: Diagnosis not present

## 2024-03-01 DIAGNOSIS — I495 Sick sinus syndrome: Secondary | ICD-10-CM | POA: Diagnosis not present

## 2024-03-01 DIAGNOSIS — Z0001 Encounter for general adult medical examination with abnormal findings: Secondary | ICD-10-CM | POA: Diagnosis not present

## 2024-03-01 DIAGNOSIS — Z6828 Body mass index (BMI) 28.0-28.9, adult: Secondary | ICD-10-CM | POA: Diagnosis not present

## 2024-03-01 DIAGNOSIS — E2749 Other adrenocortical insufficiency: Secondary | ICD-10-CM | POA: Diagnosis not present

## 2024-03-01 DIAGNOSIS — I700 Atherosclerosis of aorta: Secondary | ICD-10-CM | POA: Diagnosis not present

## 2024-03-01 DIAGNOSIS — M47816 Spondylosis without myelopathy or radiculopathy, lumbar region: Secondary | ICD-10-CM | POA: Diagnosis not present

## 2024-03-01 DIAGNOSIS — N3281 Overactive bladder: Secondary | ICD-10-CM | POA: Diagnosis not present

## 2024-03-01 DIAGNOSIS — G894 Chronic pain syndrome: Secondary | ICD-10-CM | POA: Diagnosis not present

## 2024-03-01 DIAGNOSIS — Z1331 Encounter for screening for depression: Secondary | ICD-10-CM | POA: Diagnosis not present

## 2024-03-01 DIAGNOSIS — I4891 Unspecified atrial fibrillation: Secondary | ICD-10-CM | POA: Diagnosis not present

## 2024-03-02 DIAGNOSIS — Z0001 Encounter for general adult medical examination with abnormal findings: Secondary | ICD-10-CM | POA: Diagnosis not present

## 2024-03-02 DIAGNOSIS — E559 Vitamin D deficiency, unspecified: Secondary | ICD-10-CM | POA: Diagnosis not present

## 2024-03-02 DIAGNOSIS — D518 Other vitamin B12 deficiency anemias: Secondary | ICD-10-CM | POA: Diagnosis not present

## 2024-03-02 DIAGNOSIS — E063 Autoimmune thyroiditis: Secondary | ICD-10-CM | POA: Diagnosis not present

## 2024-03-02 DIAGNOSIS — E782 Mixed hyperlipidemia: Secondary | ICD-10-CM | POA: Diagnosis not present

## 2024-03-15 DIAGNOSIS — Z6828 Body mass index (BMI) 28.0-28.9, adult: Secondary | ICD-10-CM | POA: Diagnosis not present

## 2024-03-15 DIAGNOSIS — K2100 Gastro-esophageal reflux disease with esophagitis, without bleeding: Secondary | ICD-10-CM | POA: Diagnosis not present

## 2024-03-15 DIAGNOSIS — I700 Atherosclerosis of aorta: Secondary | ICD-10-CM | POA: Diagnosis not present

## 2024-03-15 DIAGNOSIS — T50905A Adverse effect of unspecified drugs, medicaments and biological substances, initial encounter: Secondary | ICD-10-CM | POA: Diagnosis not present

## 2024-03-15 DIAGNOSIS — G894 Chronic pain syndrome: Secondary | ICD-10-CM | POA: Diagnosis not present

## 2024-03-15 DIAGNOSIS — M159 Polyosteoarthritis, unspecified: Secondary | ICD-10-CM | POA: Diagnosis not present

## 2024-03-15 DIAGNOSIS — I495 Sick sinus syndrome: Secondary | ICD-10-CM | POA: Diagnosis not present

## 2024-03-15 DIAGNOSIS — E2749 Other adrenocortical insufficiency: Secondary | ICD-10-CM | POA: Diagnosis not present

## 2024-03-15 DIAGNOSIS — I4891 Unspecified atrial fibrillation: Secondary | ICD-10-CM | POA: Diagnosis not present

## 2024-03-15 DIAGNOSIS — E663 Overweight: Secondary | ICD-10-CM | POA: Diagnosis not present

## 2024-03-15 DIAGNOSIS — I10 Essential (primary) hypertension: Secondary | ICD-10-CM | POA: Diagnosis not present

## 2024-03-16 NOTE — Progress Notes (Signed)
 Remote pacemaker transmission.

## 2024-03-16 NOTE — Addendum Note (Signed)
 Addended by: Elease Etienne A on: 03/16/2024 12:21 PM   Modules accepted: Orders

## 2024-04-04 DIAGNOSIS — I10 Essential (primary) hypertension: Secondary | ICD-10-CM | POA: Diagnosis not present

## 2024-04-04 DIAGNOSIS — T50905A Adverse effect of unspecified drugs, medicaments and biological substances, initial encounter: Secondary | ICD-10-CM | POA: Diagnosis not present

## 2024-04-04 DIAGNOSIS — E663 Overweight: Secondary | ICD-10-CM | POA: Diagnosis not present

## 2024-04-04 DIAGNOSIS — Z6828 Body mass index (BMI) 28.0-28.9, adult: Secondary | ICD-10-CM | POA: Diagnosis not present

## 2024-04-09 DIAGNOSIS — J019 Acute sinusitis, unspecified: Secondary | ICD-10-CM | POA: Diagnosis not present

## 2024-04-09 DIAGNOSIS — H9203 Otalgia, bilateral: Secondary | ICD-10-CM | POA: Diagnosis not present

## 2024-04-09 DIAGNOSIS — H6503 Acute serous otitis media, bilateral: Secondary | ICD-10-CM | POA: Diagnosis not present

## 2024-05-10 ENCOUNTER — Ambulatory Visit (INDEPENDENT_AMBULATORY_CARE_PROVIDER_SITE_OTHER): Payer: Medicare Other

## 2024-05-10 DIAGNOSIS — I495 Sick sinus syndrome: Secondary | ICD-10-CM | POA: Diagnosis not present

## 2024-05-12 LAB — CUP PACEART REMOTE DEVICE CHECK
Battery Remaining Longevity: 103 mo
Battery Remaining Percentage: 80 %
Battery Voltage: 3.02 V
Brady Statistic AP VP Percent: 1 %
Brady Statistic AP VS Percent: 13 %
Brady Statistic AS VP Percent: 1 %
Brady Statistic AS VS Percent: 86 %
Brady Statistic RA Percent Paced: 13 %
Brady Statistic RV Percent Paced: 1 %
Date Time Interrogation Session: 20250527194257
Implantable Lead Connection Status: 753985
Implantable Lead Connection Status: 753985
Implantable Lead Implant Date: 20120802
Implantable Lead Implant Date: 20120802
Implantable Lead Location: 753859
Implantable Lead Location: 753860
Implantable Pulse Generator Implant Date: 20220829
Lead Channel Impedance Value: 390 Ohm
Lead Channel Impedance Value: 440 Ohm
Lead Channel Pacing Threshold Amplitude: 0.5 V
Lead Channel Pacing Threshold Amplitude: 0.75 V
Lead Channel Pacing Threshold Pulse Width: 0.4 ms
Lead Channel Pacing Threshold Pulse Width: 0.4 ms
Lead Channel Sensing Intrinsic Amplitude: 2.4 mV
Lead Channel Sensing Intrinsic Amplitude: 2.8 mV
Lead Channel Setting Pacing Amplitude: 1 V
Lead Channel Setting Pacing Amplitude: 2 V
Lead Channel Setting Pacing Pulse Width: 0.4 ms
Lead Channel Setting Sensing Sensitivity: 0.5 mV
Pulse Gen Model: 2272
Pulse Gen Serial Number: 6519548

## 2024-05-14 DIAGNOSIS — I10 Essential (primary) hypertension: Secondary | ICD-10-CM | POA: Diagnosis not present

## 2024-05-14 DIAGNOSIS — M47816 Spondylosis without myelopathy or radiculopathy, lumbar region: Secondary | ICD-10-CM | POA: Diagnosis not present

## 2024-05-18 ENCOUNTER — Ambulatory Visit: Payer: Self-pay | Admitting: Cardiovascular Disease

## 2024-05-27 DIAGNOSIS — I4891 Unspecified atrial fibrillation: Secondary | ICD-10-CM | POA: Diagnosis not present

## 2024-05-27 DIAGNOSIS — F411 Generalized anxiety disorder: Secondary | ICD-10-CM | POA: Diagnosis not present

## 2024-05-27 DIAGNOSIS — M47816 Spondylosis without myelopathy or radiculopathy, lumbar region: Secondary | ICD-10-CM | POA: Diagnosis not present

## 2024-05-27 DIAGNOSIS — E663 Overweight: Secondary | ICD-10-CM | POA: Diagnosis not present

## 2024-05-27 DIAGNOSIS — M5000 Cervical disc disorder with myelopathy, unspecified cervical region: Secondary | ICD-10-CM | POA: Diagnosis not present

## 2024-05-27 DIAGNOSIS — I10 Essential (primary) hypertension: Secondary | ICD-10-CM | POA: Diagnosis not present

## 2024-05-27 DIAGNOSIS — M159 Polyosteoarthritis, unspecified: Secondary | ICD-10-CM | POA: Diagnosis not present

## 2024-05-27 DIAGNOSIS — M810 Age-related osteoporosis without current pathological fracture: Secondary | ICD-10-CM | POA: Diagnosis not present

## 2024-05-27 DIAGNOSIS — G894 Chronic pain syndrome: Secondary | ICD-10-CM | POA: Diagnosis not present

## 2024-05-27 DIAGNOSIS — Z6827 Body mass index (BMI) 27.0-27.9, adult: Secondary | ICD-10-CM | POA: Diagnosis not present

## 2024-05-27 DIAGNOSIS — K2100 Gastro-esophageal reflux disease with esophagitis, without bleeding: Secondary | ICD-10-CM | POA: Diagnosis not present

## 2024-05-27 DIAGNOSIS — I495 Sick sinus syndrome: Secondary | ICD-10-CM | POA: Diagnosis not present

## 2024-07-01 NOTE — Progress Notes (Signed)
 Remote pacemaker transmission.

## 2024-07-01 NOTE — Addendum Note (Signed)
 Addended by: TAWNI DRILLING D on: 07/01/2024 02:06 PM   Modules accepted: Orders

## 2024-07-14 DIAGNOSIS — F419 Anxiety disorder, unspecified: Secondary | ICD-10-CM | POA: Diagnosis not present

## 2024-07-14 DIAGNOSIS — G4700 Insomnia, unspecified: Secondary | ICD-10-CM | POA: Diagnosis not present

## 2024-07-14 DIAGNOSIS — I700 Atherosclerosis of aorta: Secondary | ICD-10-CM | POA: Diagnosis not present

## 2024-08-09 ENCOUNTER — Ambulatory Visit (INDEPENDENT_AMBULATORY_CARE_PROVIDER_SITE_OTHER): Payer: Medicare Other

## 2024-08-09 DIAGNOSIS — I495 Sick sinus syndrome: Secondary | ICD-10-CM

## 2024-08-10 LAB — CUP PACEART REMOTE DEVICE CHECK
Battery Remaining Longevity: 100 mo
Battery Remaining Percentage: 78 %
Battery Voltage: 3.02 V
Brady Statistic AP VP Percent: 1 %
Brady Statistic AP VS Percent: 16 %
Brady Statistic AS VP Percent: 1 %
Brady Statistic AS VS Percent: 84 %
Brady Statistic RA Percent Paced: 16 %
Brady Statistic RV Percent Paced: 1 %
Date Time Interrogation Session: 20250826040014
Implantable Lead Connection Status: 753985
Implantable Lead Connection Status: 753985
Implantable Lead Implant Date: 20120802
Implantable Lead Implant Date: 20120802
Implantable Lead Location: 753859
Implantable Lead Location: 753860
Implantable Pulse Generator Implant Date: 20220829
Lead Channel Impedance Value: 410 Ohm
Lead Channel Impedance Value: 450 Ohm
Lead Channel Pacing Threshold Amplitude: 0.5 V
Lead Channel Pacing Threshold Amplitude: 1.125 V
Lead Channel Pacing Threshold Pulse Width: 0.4 ms
Lead Channel Pacing Threshold Pulse Width: 0.4 ms
Lead Channel Sensing Intrinsic Amplitude: 2.5 mV
Lead Channel Sensing Intrinsic Amplitude: 3 mV
Lead Channel Setting Pacing Amplitude: 1.375
Lead Channel Setting Pacing Amplitude: 2 V
Lead Channel Setting Pacing Pulse Width: 0.4 ms
Lead Channel Setting Sensing Sensitivity: 0.5 mV
Pulse Gen Model: 2272
Pulse Gen Serial Number: 6519548

## 2024-08-11 ENCOUNTER — Ambulatory Visit: Payer: Self-pay | Admitting: Cardiovascular Disease

## 2024-08-17 DIAGNOSIS — Z6828 Body mass index (BMI) 28.0-28.9, adult: Secondary | ICD-10-CM | POA: Diagnosis not present

## 2024-08-17 DIAGNOSIS — M5000 Cervical disc disorder with myelopathy, unspecified cervical region: Secondary | ICD-10-CM | POA: Diagnosis not present

## 2024-08-17 DIAGNOSIS — E663 Overweight: Secondary | ICD-10-CM | POA: Diagnosis not present

## 2024-08-17 DIAGNOSIS — M159 Polyosteoarthritis, unspecified: Secondary | ICD-10-CM | POA: Diagnosis not present

## 2024-08-17 DIAGNOSIS — I4891 Unspecified atrial fibrillation: Secondary | ICD-10-CM | POA: Diagnosis not present

## 2024-08-17 DIAGNOSIS — M47816 Spondylosis without myelopathy or radiculopathy, lumbar region: Secondary | ICD-10-CM | POA: Diagnosis not present

## 2024-08-17 DIAGNOSIS — G894 Chronic pain syndrome: Secondary | ICD-10-CM | POA: Diagnosis not present

## 2024-08-17 DIAGNOSIS — M810 Age-related osteoporosis without current pathological fracture: Secondary | ICD-10-CM | POA: Diagnosis not present

## 2024-08-30 NOTE — Progress Notes (Signed)
 Remote PPM Transmission

## 2024-09-14 DIAGNOSIS — Z7689 Persons encountering health services in other specified circumstances: Secondary | ICD-10-CM | POA: Diagnosis not present

## 2024-09-14 DIAGNOSIS — Z79891 Long term (current) use of opiate analgesic: Secondary | ICD-10-CM | POA: Diagnosis not present

## 2024-09-14 DIAGNOSIS — N951 Menopausal and female climacteric states: Secondary | ICD-10-CM | POA: Diagnosis not present

## 2024-09-14 DIAGNOSIS — I10 Essential (primary) hypertension: Secondary | ICD-10-CM | POA: Diagnosis not present

## 2024-09-14 DIAGNOSIS — F5104 Psychophysiologic insomnia: Secondary | ICD-10-CM | POA: Diagnosis not present

## 2024-09-14 DIAGNOSIS — E782 Mixed hyperlipidemia: Secondary | ICD-10-CM | POA: Diagnosis not present

## 2024-09-14 DIAGNOSIS — F411 Generalized anxiety disorder: Secondary | ICD-10-CM | POA: Diagnosis not present

## 2024-09-14 DIAGNOSIS — M47816 Spondylosis without myelopathy or radiculopathy, lumbar region: Secondary | ICD-10-CM | POA: Diagnosis not present

## 2024-09-14 DIAGNOSIS — Z79899 Other long term (current) drug therapy: Secondary | ICD-10-CM | POA: Diagnosis not present

## 2024-09-14 DIAGNOSIS — I480 Paroxysmal atrial fibrillation: Secondary | ICD-10-CM | POA: Diagnosis not present

## 2024-09-14 DIAGNOSIS — M5000 Cervical disc disorder with myelopathy, unspecified cervical region: Secondary | ICD-10-CM | POA: Diagnosis not present

## 2024-09-14 DIAGNOSIS — M810 Age-related osteoporosis without current pathological fracture: Secondary | ICD-10-CM | POA: Diagnosis not present

## 2024-09-15 ENCOUNTER — Other Ambulatory Visit (HOSPITAL_COMMUNITY): Payer: Self-pay | Admitting: Internal Medicine

## 2024-09-15 DIAGNOSIS — Z1231 Encounter for screening mammogram for malignant neoplasm of breast: Secondary | ICD-10-CM

## 2024-09-19 ENCOUNTER — Encounter (HOSPITAL_COMMUNITY): Payer: Self-pay

## 2024-09-19 ENCOUNTER — Ambulatory Visit (HOSPITAL_COMMUNITY)
Admission: RE | Admit: 2024-09-19 | Discharge: 2024-09-19 | Disposition: A | Discharge status: Home / Self Care | Source: Ambulatory Visit | Attending: Internal Medicine | Admitting: Internal Medicine

## 2024-09-19 DIAGNOSIS — Z1231 Encounter for screening mammogram for malignant neoplasm of breast: Secondary | ICD-10-CM | POA: Insufficient documentation

## 2024-10-14 ENCOUNTER — Other Ambulatory Visit (HOSPITAL_COMMUNITY): Payer: Self-pay

## 2024-10-14 DIAGNOSIS — I10 Essential (primary) hypertension: Secondary | ICD-10-CM | POA: Diagnosis not present

## 2024-10-14 DIAGNOSIS — E782 Mixed hyperlipidemia: Secondary | ICD-10-CM | POA: Diagnosis not present

## 2024-10-14 DIAGNOSIS — R1030 Lower abdominal pain, unspecified: Secondary | ICD-10-CM

## 2024-10-14 DIAGNOSIS — M5000 Cervical disc disorder with myelopathy, unspecified cervical region: Secondary | ICD-10-CM | POA: Diagnosis not present

## 2024-10-14 DIAGNOSIS — F411 Generalized anxiety disorder: Secondary | ICD-10-CM | POA: Diagnosis not present

## 2024-10-14 DIAGNOSIS — R399 Unspecified symptoms and signs involving the genitourinary system: Secondary | ICD-10-CM | POA: Diagnosis not present

## 2024-10-14 DIAGNOSIS — N951 Menopausal and female climacteric states: Secondary | ICD-10-CM | POA: Diagnosis not present

## 2024-10-14 DIAGNOSIS — Z7182 Exercise counseling: Secondary | ICD-10-CM | POA: Diagnosis not present

## 2024-10-14 DIAGNOSIS — M47816 Spondylosis without myelopathy or radiculopathy, lumbar region: Secondary | ICD-10-CM | POA: Diagnosis not present

## 2024-10-14 DIAGNOSIS — Z713 Dietary counseling and surveillance: Secondary | ICD-10-CM | POA: Diagnosis not present

## 2024-10-18 ENCOUNTER — Ambulatory Visit (HOSPITAL_COMMUNITY): Admission: RE | Admit: 2024-10-18 | Discharge: 2024-10-18 | Disposition: A | Source: Ambulatory Visit

## 2024-10-18 ENCOUNTER — Ambulatory Visit (HOSPITAL_COMMUNITY)
Admission: RE | Admit: 2024-10-18 | Discharge: 2024-10-18 | Disposition: A | Discharge status: Home / Self Care | Source: Ambulatory Visit

## 2024-10-18 DIAGNOSIS — I700 Atherosclerosis of aorta: Secondary | ICD-10-CM | POA: Insufficient documentation

## 2024-10-18 DIAGNOSIS — Z9049 Acquired absence of other specified parts of digestive tract: Secondary | ICD-10-CM | POA: Diagnosis not present

## 2024-10-18 DIAGNOSIS — R399 Unspecified symptoms and signs involving the genitourinary system: Secondary | ICD-10-CM | POA: Insufficient documentation

## 2024-10-18 DIAGNOSIS — K5730 Diverticulosis of large intestine without perforation or abscess without bleeding: Secondary | ICD-10-CM | POA: Insufficient documentation

## 2024-10-18 DIAGNOSIS — R1030 Lower abdominal pain, unspecified: Secondary | ICD-10-CM | POA: Insufficient documentation

## 2024-10-18 DIAGNOSIS — R109 Unspecified abdominal pain: Secondary | ICD-10-CM | POA: Diagnosis not present

## 2024-10-18 DIAGNOSIS — R10A2 Flank pain, left side: Secondary | ICD-10-CM | POA: Diagnosis not present

## 2024-10-18 DIAGNOSIS — R350 Frequency of micturition: Secondary | ICD-10-CM | POA: Diagnosis not present

## 2024-10-18 DIAGNOSIS — Z90710 Acquired absence of both cervix and uterus: Secondary | ICD-10-CM | POA: Insufficient documentation

## 2024-10-18 DIAGNOSIS — N3941 Urge incontinence: Secondary | ICD-10-CM | POA: Insufficient documentation

## 2024-11-07 DIAGNOSIS — M5000 Cervical disc disorder with myelopathy, unspecified cervical region: Secondary | ICD-10-CM | POA: Diagnosis not present

## 2024-11-07 DIAGNOSIS — F5104 Psychophysiologic insomnia: Secondary | ICD-10-CM | POA: Diagnosis not present

## 2024-11-07 DIAGNOSIS — I10 Essential (primary) hypertension: Secondary | ICD-10-CM | POA: Diagnosis not present

## 2024-11-07 DIAGNOSIS — E782 Mixed hyperlipidemia: Secondary | ICD-10-CM | POA: Diagnosis not present

## 2024-11-07 DIAGNOSIS — N951 Menopausal and female climacteric states: Secondary | ICD-10-CM | POA: Diagnosis not present

## 2024-11-07 DIAGNOSIS — M47816 Spondylosis without myelopathy or radiculopathy, lumbar region: Secondary | ICD-10-CM | POA: Diagnosis not present

## 2024-11-07 DIAGNOSIS — F411 Generalized anxiety disorder: Secondary | ICD-10-CM | POA: Diagnosis not present

## 2024-11-08 ENCOUNTER — Ambulatory Visit: Payer: Medicare Other

## 2024-11-08 DIAGNOSIS — I495 Sick sinus syndrome: Secondary | ICD-10-CM | POA: Diagnosis not present

## 2024-11-09 LAB — CUP PACEART REMOTE DEVICE CHECK
Battery Remaining Longevity: 97 mo
Battery Remaining Percentage: 75 %
Battery Voltage: 3.02 V
Brady Statistic AP VP Percent: 1 %
Brady Statistic AP VS Percent: 17 %
Brady Statistic AS VP Percent: 1 %
Brady Statistic AS VS Percent: 83 %
Brady Statistic RA Percent Paced: 16 %
Brady Statistic RV Percent Paced: 1 %
Date Time Interrogation Session: 20251125120744
Implantable Lead Connection Status: 753985
Implantable Lead Connection Status: 753985
Implantable Lead Implant Date: 20120802
Implantable Lead Implant Date: 20120802
Implantable Lead Location: 753859
Implantable Lead Location: 753860
Implantable Pulse Generator Implant Date: 20220829
Lead Channel Impedance Value: 380 Ohm
Lead Channel Impedance Value: 430 Ohm
Lead Channel Pacing Threshold Amplitude: 0.5 V
Lead Channel Pacing Threshold Amplitude: 1.125 V
Lead Channel Pacing Threshold Pulse Width: 0.4 ms
Lead Channel Pacing Threshold Pulse Width: 0.4 ms
Lead Channel Sensing Intrinsic Amplitude: 2.1 mV
Lead Channel Sensing Intrinsic Amplitude: 3 mV
Lead Channel Setting Pacing Amplitude: 1.375
Lead Channel Setting Pacing Amplitude: 2 V
Lead Channel Setting Pacing Pulse Width: 0.4 ms
Lead Channel Setting Sensing Sensitivity: 0.5 mV
Pulse Gen Model: 2272
Pulse Gen Serial Number: 6519548

## 2024-11-11 ENCOUNTER — Ambulatory Visit: Payer: Self-pay | Admitting: Cardiovascular Disease

## 2024-11-11 NOTE — Progress Notes (Signed)
 Remote PPM Transmission

## 2024-12-22 ENCOUNTER — Encounter: Payer: Self-pay | Admitting: Cardiovascular Disease

## 2024-12-22 ENCOUNTER — Telehealth: Payer: Self-pay | Admitting: Pharmacy Technician

## 2024-12-22 ENCOUNTER — Other Ambulatory Visit (HOSPITAL_COMMUNITY): Payer: Self-pay

## 2024-12-22 ENCOUNTER — Ambulatory Visit: Attending: Cardiovascular Disease | Admitting: Cardiovascular Disease

## 2024-12-22 VITALS — BP 138/60 | HR 63 | Ht 62.0 in | Wt 153.0 lb

## 2024-12-22 DIAGNOSIS — I4719 Other supraventricular tachycardia: Secondary | ICD-10-CM

## 2024-12-22 DIAGNOSIS — I495 Sick sinus syndrome: Secondary | ICD-10-CM | POA: Diagnosis not present

## 2024-12-22 DIAGNOSIS — R0989 Other specified symptoms and signs involving the circulatory and respiratory systems: Secondary | ICD-10-CM

## 2024-12-22 DIAGNOSIS — T466X5D Adverse effect of antihyperlipidemic and antiarteriosclerotic drugs, subsequent encounter: Secondary | ICD-10-CM | POA: Diagnosis not present

## 2024-12-22 DIAGNOSIS — I2510 Atherosclerotic heart disease of native coronary artery without angina pectoris: Secondary | ICD-10-CM

## 2024-12-22 DIAGNOSIS — Z79899 Other long term (current) drug therapy: Secondary | ICD-10-CM | POA: Diagnosis not present

## 2024-12-22 DIAGNOSIS — G720 Drug-induced myopathy: Secondary | ICD-10-CM

## 2024-12-22 DIAGNOSIS — Z950 Presence of cardiac pacemaker: Secondary | ICD-10-CM

## 2024-12-22 DIAGNOSIS — I700 Atherosclerosis of aorta: Secondary | ICD-10-CM | POA: Diagnosis not present

## 2024-12-22 MED ORDER — REPATHA SURECLICK 140 MG/ML ~~LOC~~ SOAJ: 140.0000 mg | SUBCUTANEOUS | 2 refills | Status: AC

## 2024-12-22 NOTE — Telephone Encounter (Signed)
 Pharmacy Patient Advocate Encounter  Received notification from [employer] that Prior Authorization for repatha  has been APPROVED from 12/22/24 to 12/14/25   PA #/Case ID/Reference #: [id number]

## 2024-12-22 NOTE — Telephone Encounter (Signed)
 Pharmacy Patient Advocate Encounter   Received notification from Patient Advice Request messages that prior authorization for repatha  is required/requested.   Insurance verification completed.   The patient is insured through [employer].   Per test claim: PA required; PA submitted to above mentioned insurance via Latent Key/confirmation #/EOC [id number] Status is pending

## 2024-12-22 NOTE — Patient Instructions (Signed)
 Medication Instructions:  Your physician has recommended you make the following change in your medication:  START: Repatha  140 mg/ml inject into the skin every 14 (fourteen) days  *If you need a refill on your cardiac medications before your next appointment, please call your pharmacy*  Lab Work: In 3 months: Lipids - fasting labs Hours: 8am-4:30p, no appointment is needed  If you have labs (blood work) drawn today and your tests are completely normal, you will receive your results only by: MyChart Message (if you have MyChart) OR A paper copy in the mail If you have any lab test that is abnormal or we need to change your treatment, we will call you to review the results.  Testing/Procedures: Your physician has requested that you have a carotid duplex. This test is an ultrasound of the carotid arteries in your neck. It looks at blood flow through these arteries that supply the brain with blood. Allow one hour for this exam. There are no restrictions or special instructions.   Follow-Up: At [facility], you and your health needs are our priority.  As part of our continuing mission to provide you with exceptional heart care, our providers are all part of one team.  This team includes your primary Cardiologist (physician) and Advanced Practice Providers or APPs (Physician Assistants and Nurse Practitioners) who all work together to provide you with the care you need, when you need it.  Your next appointment:   1 year(s)  Provider:   Jerel Balding, MD

## 2024-12-22 NOTE — Telephone Encounter (Signed)
 Patient Advocate Encounter   The patient was approved for a [employer] grant that will help cover the cost of Repatha  Total amount awarded, 2500.00.  Effective: 11/22/24 - 11/21/25   APW:[id number] ERW:[id number] Hmnle:[id number] PI:[id number]  [employer] ID: [id number]   Pharmacy provided with approval and processing information. Patient informed via mychart

## 2024-12-22 NOTE — Progress Notes (Signed)
 "      Cardiology Office Note    Date:  12/22/2024   ID:  Brandy, Thompson 08-25-1951, MRN [id number]  PCP:  Gladis Lauraine BRAVO, NP  Cardiologist:   Jerel Balding, MD   Chief complaint: Pacemaker check  History of Present Illness:  [name] is a 74 y.o. female with tachycardia-bradycardia syndrome (sinus bradycardia and paroxysmal atrial tachycardia) with a dual-chamber permanent pacemaker ([employer] 2088 leads implanted 2012, gen change Assurity MRI August 2022).  She is doing well from a cardiac point of view.  She has not had shortness of breath with activity or exercise (but she is quite sedentary), denies chest pain, palpitations, dizziness, syncope, lower extremity edema or claudication.  She has a long history of intolerance to statins due to myopathy and had similar side effects with ezetimibe.  Pacemaker function is normal.  Presenting rhythm is A sensed, V sensed.  She has an MRI conditional system.  She has only 17% atrial pacing and virtually no ventricular pacing.  Mode switch has occurred less than 1% of the time with the longest episode of possible paroxysmal atrial fibrillation being about 30 seconds in duration.  She has had about 20 episodes of mode switch over the last year, most of which appear to be brief bursts of atrial tachycardia.  She has had rare episodes of atrial and ventricular noise reversion (which we have seen before with her 2088 leads).  Otherwise all lead parameters are excellent.  Estimated generator longevity is 7-8.5 years or so.  Metabolic control is suboptimal.  Her LDL cholesterol is 160 although she has a good HDL at 62, normal triglycerides and she does not have diabetes mellitus.  She has normal renal function.   Past Medical History:  Diagnosis Date   Anxiety    Diverticulosis of colon    History of kidney stones    Osteoarthritis    Osteoporosis    Paroxysmal atrial fibrillation (HCC)    with SVT response   Presence of permanent  cardiac pacemaker    Sinus node dysfunction (HCC)    Tachy-brady syndrome (HCC)     Past Surgical History:  Procedure Laterality Date   ABDOMINAL HYSTERECTOMY     APPENDECTOMY     BREAST BIOPSY     right, benign   CERVICAL SPINE SURGERY     two   CHOLECYSTECTOMY     COLONOSCOPY  2007   sigmoid diverticula, few ulcerations of terminal ileum, biopsies of TI unremarkable, random colon bx neg for microscopic colitis.   COLONOSCOPY  06/04/11   Dr. Shaaron- pegmentation of the rectum, pancolonic diverticula, solitary ulcer in the mouth of the ileocecal valve, distal 15 cm of the terminal ileum appeared normal. Random colon biopsies were benign. Ileocecal valve ulcer or was benign without any supportive evidence of inflammatory bowel disease.   COLONOSCOPY WITH PROPOFOL  N/A 04/26/2018   Dr. Shaaron: diverticulosis, no future screening colonoscopies planned due to age.   ESOPHAGOGASTRODUODENOSCOPY  06/04/11   Dr. Tisha esophagus, deffuse petechial and gastric submucosal petechiae- benign mucousa on bx. Small bowel biopsy benign. No H. pylori.   ESOPHAGOGASTRODUODENOSCOPY (EGD) WITH PROPOFOL  N/A 07/28/2019   Dr. Thersia: Small hiatal hernia, dyspepsia likely due to Fosamax   INSERT / REPLACE / REMOVE PACEMAKER     left hip surgery     in [place], around 2018   PERMANENT PACEMAKER INSERTION  07/2011   PPM GENERATOR CHANGEOUT N/A 08/12/2021   Procedure: PPM GENERATOR CHANGEOUT;  Surgeon: Francyne Headland, MD;  Location: [facility];  Service: Cardiovascular;  Laterality: N/A;   s/p hysterectomy     sb capsule study  2009   Two single small nonbleeding AVMs in the  proximal small bowel, single lymphangiectasia in mid small bowel.   vocal cord polypectomy      Current Medications: Outpatient Medications Prior to Visit  Medication Sig Dispense Refill   ALPRAZolam (XANAX) 0.5 MG tablet Take 0.5 mg by mouth daily as needed for anxiety.     b complex vitamins tablet Take 1 tablet by mouth  daily.     Cholecalciferol (VITAMIN D-3) 125 MCG (5000 UT) TABS Take 5,000 Units by mouth daily. Vit K 180 mg     Menthol, Topical Analgesic, (BIOFREEZE COLORLESS) 4 % GEL Apply 1 application  topically daily as needed (Pain).     mirtazapine (REMERON) 30 MG tablet Take 30 mg by mouth at bedtime.     Omega-3 Fatty Acids (FISH OIL) 1000 MG CAPS Take 1,000 mg by mouth 2 (two) times daily.     Oxycodone HCl 20 MG TABS Take 1 tablet by mouth every 6 (six) hours.     PARoxetine (PAXIL) 10 MG tablet Take 10 mg by mouth daily.     zolpidem (AMBIEN) 10 MG tablet Take 10 mg by mouth at bedtime.     CALCIUM-VITAMIN D PO Take 1 tablet by mouth 2 (two) times daily.     mirtazapine (REMERON) 45 MG tablet Take 45 mg by mouth daily.     pantoprazole  (PROTONIX ) 40 MG tablet Take 1 tablet (40 mg total) by mouth daily as needed (Heartburn).     No facility-administered medications prior to visit.     Allergies:   Ampicillin and Iodine   Social History   Socioeconomic History   Marital status: Divorced    Spouse name: Not on file   Number of children: Not on file   Years of education: Not on file   Highest education level: Not on file  Occupational History   Occupation: [employer]  Tobacco Use   Smoking status: Former    Current packs/day: 0.00    Types: Cigarettes    Quit date: 12/15/1973    Years since quitting: 51.0   Smokeless tobacco: Never   Tobacco comments:    occas  Vaping Use   Vaping status: Never Used  Substance and Sexual Activity   Alcohol use: No   Drug use: No   Sexual activity: Not on file  Other Topics Concern   Not on file  Social History Narrative   Has one son   Social Drivers of Health   Tobacco Use: Medium Risk (12/22/2024)   Patient History    Smoking Tobacco Use: Former    Smokeless Tobacco Use: Never    Passive Exposure: Not on Actuary Strain: Not on file  Food Insecurity: Not on file  Transportation Needs: Not on file  Physical Activity: Not  on file  Stress: Not on file  Social Connections: Not on file  Depression (EYV7-0): Not on file  Alcohol Screen: Not on file  Housing: Not on file  Utilities: Not on file  Health Literacy: Not on file     Family History:  The patient's family history includes COPD (age of onset: 20) in her mother; Diabetes in her brother; GI problems in her sister; Heart attack (age of onset: 38) in her father; Hypertension in her brother; Stroke (age of onset: 78)  in her father.   ROS:   Please see the history of present illness.    ROS All other systems are reviewed and are negative.   PHYSICAL EXAM:   VS:  BP 138/60   Pulse 63   Ht 5' 2 (1.575 m)   Wt 153 lb (69.4 kg)   SpO2 96%   BMI 27.98 kg/m     General: Alert, oriented x3, no distress, healthy left subclavian pacemaker site Head: no evidence of trauma, PERRL, EOMI, no exophtalmos or lid lag, no myxedema, no xanthelasma; normal ears, nose and oropharynx Neck: normal jugular venous pulsations and no hepatojugular reflux; moderate bilateral carotid bruits Chest: clear to auscultation, no signs of consolidation by percussion or palpation, normal fremitus, symmetrical and full respiratory excursions Cardiovascular: normal position and quality of the apical impulse, regular rhythm, normal first and second heart sounds, no murmurs, rubs or gallops Abdomen: no tenderness or distention, no masses by palpation, no abnormal pulsatility or arterial bruits, normal bowel sounds, no hepatosplenomegaly Extremities: no clubbing, cyanosis or edema; 2+ radial, ulnar and brachial pulses bilaterally; 2+ right femoral, posterior tibial and dorsalis pedis pulses; 2+ left femoral, posterior tibial and dorsalis pedis pulses; no subclavian or femoral bruits Neurological: grossly nonfocal Psych: Normal mood and affect       Wt Readings from Last 3 Encounters:  12/22/24 153 lb (69.4 kg)  12/10/23 157 lb 3.2 oz (71.3 kg)  12/04/22 151 lb (68.5 kg)      Studies/Labs Reviewed:  Lexiscan  Myoview  09/24/2022   Findings are consistent with no prior ischemia. The study is low risk.   No ST deviation was noted.   LV perfusion is abnormal. Defect 1: There is a small defect with moderate reduction in uptake present in the apical apex location(s) that is fixed. There is normal wall motion in the defect area. Consistent with artifact caused by breast attenuation.   Left ventricular function is normal. End diastolic cavity size is normal. End systolic cavity size is normal.   Prior study available for comparison from 05/05/2008.   Small size, moderate intensity fixed apical/apical septal perfusion defect, suspicious for breast attenuation artifact. No significant reversible ischemia (SDS 1). LVEF 83% with normal wall motion. This is a low risk study. Prior study in 2009, not directly available to review.   EKG:    EKG Interpretation Date/Time:  Thursday December 22 2024 08:17:02 EST Ventricular Rate:  63 PR Interval:  140 QRS Duration:  70 QT Interval:  412 QTC Calculation: 421 R Axis:   11  Text Interpretation: Normal sinus rhythm Normal ECG When compared with ECG of 10-Dec-2023 10:20, No significant change was found Confirmed by Hazem Kenner ([id number]) on 12/22/2024 8:30:15 AM         Recent Labs: No results found for requested labs within last 365 days.  09/13/2019 potassium 3.7, creatinine 0.8, hemoglobin A1c 7% Lipid Panel    Component Value Date/Time   CHOL 156 07/17/2011 0605   TRIG 107 07/17/2011 0605   HDL 52 07/17/2011 0605   CHOLHDL 3.0 07/17/2011 0605   VLDL 21 07/17/2011 0605   LDLCALC 83 07/17/2011 0605   09/13/2019 total cholesterol 130, HDL 53, LDL 54, triglycerides 255 89/91/7978 Chol 198, HDL 50, TG 121 10/07/2024 cholesterol 240, HDL 62, LDL 160, triglycerides 103 Hemoglobin A1c 5.4%, hemoglobin 13.7, creatinine 0.79, potassium 4.5, ALT 19, TSH 1.270  ASSESSMENT:    1. SSS (sick sinus syndrome) (HCC)   2.  Paroxysmal atrial tachycardia   3.  Pacemaker   4. Bilateral carotid bruits   5. Coronary artery calcification   6. Aortic atherosclerosis   7. Statin myopathy   8. Medication management       PLAN:  In order of problems listed above:  SSS: Asymptomatic.  Not pacemaker dependent and actually requiring very little atrial pacing.  Normal heart rate histogram distribution. PAT: Episodes are very infrequent and brief, asymptomatic.  1 episode this year looks like atrial fibrillation but was only 30 seconds in duration, most of the episodes appear to be ectopic atrial tachycardia.  Anticoagulation is not indicated. PPM: Normal device function.  Rare episodes of noise on the atrial and ventricular lead have not led to any problems with device function.  Continue remote downloads every 3 months. Coronary calcification and aortic atherosclerosis noted on CT: Normal Lexiscan  Myoview  in September 2023.  Asymptomatic.  Target LDL preferably less than 70.  Intolerant to statins and Zetia due to myopathy.  Will try to prescribe Repatha  140 mg every 2 weeks, but she is concerned that she will not be able to afford it, living exclusively on Social Security.  Will reach out to our pharmacy team to see if there is any option for patient assistance. Carotid bruits: Sound louder than last appointment.  She had a scan about 3 years ago that did show bilateral calcified and heterogenous plaque.  Will repeat a duplex ultrasound.   Medication Adjustments/Labs and Tests Ordered: Current medicines are reviewed at length with the patient today.  Concerns regarding medicines are outlined above.  Medication changes, Labs and Tests ordered today are listed in the Patient Instructions below. Patient Instructions  Medication Instructions:  Your physician has recommended you make the following change in your medication:  START: Repatha  140 mg/ml inject into the skin every 14 (fourteen) days  *If you need a refill on  your cardiac medications before your next appointment, please call your pharmacy*  Lab Work: In 3 months: Lipids - fasting labs Hours: 8am-4:30p, no appointment is needed  If you have labs (blood work) drawn today and your tests are completely normal, you will receive your results only by: MyChart Message (if you have MyChart) OR A paper copy in the mail If you have any lab test that is abnormal or we need to change your treatment, we will call you to review the results.  Testing/Procedures: Your physician has requested that you have a carotid duplex. This test is an ultrasound of the carotid arteries in your neck. It looks at blood flow through these arteries that supply the brain with blood. Allow one hour for this exam. There are no restrictions or special instructions.   Follow-Up: At [facility], you and your health needs are our priority.  As part of our continuing mission to provide you with exceptional heart care, our providers are all part of one team.  This team includes your primary Cardiologist (physician) and Advanced Practice Providers or APPs (Physician Assistants and Nurse Practitioners) who all work together to provide you with the care you need, when you need it.  Your next appointment:   1 year(s)  Provider:   Jerel Balding, MD     Signed, Jerel Balding, MD  12/22/2024 8:54 AM    [facility] [address] [place], [place], [place]  [address] Phone: [phone]; Fax: [phone] ,mg  "

## 2024-12-26 ENCOUNTER — Encounter: Payer: Self-pay | Admitting: Cardiovascular Disease

## 2025-01-03 ENCOUNTER — Ambulatory Visit (HOSPITAL_COMMUNITY)
Admission: RE | Admit: 2025-01-03 | Discharge: 2025-01-03 | Disposition: A | Discharge status: Home / Self Care | Source: Ambulatory Visit | Attending: Cardiovascular Disease | Admitting: Cardiovascular Disease

## 2025-01-03 ENCOUNTER — Ambulatory Visit: Payer: Self-pay | Admitting: Cardiovascular Disease

## 2025-01-03 DIAGNOSIS — R0989 Other specified symptoms and signs involving the circulatory and respiratory systems: Secondary | ICD-10-CM | POA: Diagnosis present

## 2025-02-07 ENCOUNTER — Ambulatory Visit: Payer: Medicare Other

## 2025-05-09 ENCOUNTER — Ambulatory Visit: Payer: Medicare Other

## 2025-08-08 ENCOUNTER — Ambulatory Visit: Payer: Medicare Other

## 2025-11-07 ENCOUNTER — Ambulatory Visit: Payer: Medicare Other
